# Patient Record
Sex: Female | Born: 1940
Health system: Southern US, Community
[De-identification: ages and names within clinical notes are randomized; demographics above are authoritative.]

## PROBLEM LIST (undated history)

## (undated) DIAGNOSIS — Z923 Personal history of irradiation: Secondary | ICD-10-CM

## (undated) DIAGNOSIS — C541 Malignant neoplasm of endometrium: Secondary | ICD-10-CM

## (undated) DIAGNOSIS — C50512 Malignant neoplasm of lower-outer quadrant of left female breast: Principal | ICD-10-CM

## (undated) DIAGNOSIS — Z9221 Personal history of antineoplastic chemotherapy: Secondary | ICD-10-CM

## (undated) DIAGNOSIS — C50919 Malignant neoplasm of unspecified site of unspecified female breast: Secondary | ICD-10-CM

## (undated) HISTORY — PX: BREAST LUMPECTOMY: SHX2

## (undated) HISTORY — PX: TONSILLECTOMY: SUR1361

## (undated) HISTORY — DX: Malignant neoplasm of lower-outer quadrant of left female breast: C50.512

## (undated) HISTORY — PX: OTHER SURGICAL HISTORY: SHX169

## (undated) HISTORY — DX: Malignant neoplasm of unspecified site of unspecified female breast: C50.919

---

## 1998-12-25 ENCOUNTER — Emergency Department (HOSPITAL_COMMUNITY): Admission: EM | Admit: 1998-12-25 | Discharge: 1998-12-25 | Payer: Self-pay | Admitting: Emergency Medicine

## 1999-01-02 ENCOUNTER — Emergency Department (HOSPITAL_COMMUNITY): Admission: EM | Admit: 1999-01-02 | Discharge: 1999-01-02 | Payer: Self-pay | Admitting: Emergency Medicine

## 1999-12-05 ENCOUNTER — Encounter: Admission: RE | Admit: 1999-12-05 | Discharge: 1999-12-05 | Payer: Self-pay | Admitting: Internal Medicine

## 1999-12-05 ENCOUNTER — Encounter: Payer: Self-pay | Admitting: Internal Medicine

## 2000-12-05 ENCOUNTER — Encounter: Admission: RE | Admit: 2000-12-05 | Discharge: 2000-12-05 | Payer: Self-pay | Admitting: Internal Medicine

## 2000-12-05 ENCOUNTER — Encounter: Payer: Self-pay | Admitting: Internal Medicine

## 2001-12-07 ENCOUNTER — Encounter: Payer: Self-pay | Admitting: Internal Medicine

## 2001-12-07 ENCOUNTER — Encounter: Admission: RE | Admit: 2001-12-07 | Discharge: 2001-12-07 | Payer: Self-pay | Admitting: Internal Medicine

## 2002-12-17 ENCOUNTER — Encounter: Payer: Self-pay | Admitting: Internal Medicine

## 2002-12-17 ENCOUNTER — Encounter: Admission: RE | Admit: 2002-12-17 | Discharge: 2002-12-17 | Payer: Self-pay | Admitting: Internal Medicine

## 2003-12-23 ENCOUNTER — Ambulatory Visit (HOSPITAL_COMMUNITY): Admission: RE | Admit: 2003-12-23 | Discharge: 2003-12-23 | Payer: Self-pay | Admitting: Family Medicine

## 2005-01-02 ENCOUNTER — Encounter: Admission: RE | Admit: 2005-01-02 | Discharge: 2005-01-02 | Payer: Self-pay | Admitting: Internal Medicine

## 2005-01-03 ENCOUNTER — Ambulatory Visit (HOSPITAL_COMMUNITY): Admission: RE | Admit: 2005-01-03 | Discharge: 2005-01-03 | Payer: Self-pay | Admitting: Internal Medicine

## 2006-01-10 ENCOUNTER — Ambulatory Visit (HOSPITAL_COMMUNITY): Admission: RE | Admit: 2006-01-10 | Discharge: 2006-01-10 | Payer: Self-pay | Admitting: Internal Medicine

## 2006-02-15 ENCOUNTER — Emergency Department (HOSPITAL_COMMUNITY): Admission: EM | Admit: 2006-02-15 | Discharge: 2006-02-15 | Payer: Self-pay | Admitting: Family Medicine

## 2006-03-03 ENCOUNTER — Emergency Department (HOSPITAL_COMMUNITY): Admission: EM | Admit: 2006-03-03 | Discharge: 2006-03-03 | Payer: Self-pay | Admitting: Family Medicine

## 2006-06-27 ENCOUNTER — Other Ambulatory Visit: Admission: RE | Admit: 2006-06-27 | Discharge: 2006-06-27 | Payer: Self-pay | Admitting: Internal Medicine

## 2007-01-28 ENCOUNTER — Ambulatory Visit (HOSPITAL_COMMUNITY): Admission: RE | Admit: 2007-01-28 | Discharge: 2007-01-28 | Payer: Self-pay | Admitting: Internal Medicine

## 2007-01-30 ENCOUNTER — Encounter: Admission: RE | Admit: 2007-01-30 | Discharge: 2007-01-30 | Payer: Self-pay | Admitting: Internal Medicine

## 2007-01-30 ENCOUNTER — Encounter (INDEPENDENT_AMBULATORY_CARE_PROVIDER_SITE_OTHER): Payer: Self-pay | Admitting: Specialist

## 2007-07-25 ENCOUNTER — Emergency Department (HOSPITAL_COMMUNITY): Admission: EM | Admit: 2007-07-25 | Discharge: 2007-07-25 | Payer: Self-pay | Admitting: Emergency Medicine

## 2007-08-10 ENCOUNTER — Other Ambulatory Visit: Admission: RE | Admit: 2007-08-10 | Discharge: 2007-08-10 | Payer: Self-pay | Admitting: Internal Medicine

## 2007-09-10 ENCOUNTER — Encounter: Admission: RE | Admit: 2007-09-10 | Discharge: 2007-09-10 | Payer: Self-pay | Admitting: Internal Medicine

## 2008-03-03 ENCOUNTER — Encounter: Admission: RE | Admit: 2008-03-03 | Discharge: 2008-03-03 | Payer: Self-pay | Admitting: Internal Medicine

## 2008-04-26 ENCOUNTER — Emergency Department (HOSPITAL_COMMUNITY): Admission: EM | Admit: 2008-04-26 | Discharge: 2008-04-26 | Payer: Self-pay | Admitting: Emergency Medicine

## 2009-01-25 ENCOUNTER — Other Ambulatory Visit: Admission: RE | Admit: 2009-01-25 | Discharge: 2009-01-25 | Payer: Self-pay | Admitting: Internal Medicine

## 2009-03-28 ENCOUNTER — Encounter: Admission: RE | Admit: 2009-03-28 | Discharge: 2009-03-28 | Payer: Self-pay | Admitting: Internal Medicine

## 2010-04-18 ENCOUNTER — Encounter: Admission: RE | Admit: 2010-04-18 | Discharge: 2010-04-18 | Payer: Self-pay | Admitting: Internal Medicine

## 2010-07-10 ENCOUNTER — Other Ambulatory Visit: Admission: RE | Admit: 2010-07-10 | Discharge: 2010-07-10 | Payer: Self-pay | Admitting: Internal Medicine

## 2010-08-09 ENCOUNTER — Encounter (INDEPENDENT_AMBULATORY_CARE_PROVIDER_SITE_OTHER): Payer: Self-pay | Admitting: *Deleted

## 2010-10-08 ENCOUNTER — Other Ambulatory Visit: Payer: Self-pay | Admitting: Gastroenterology

## 2010-10-14 ENCOUNTER — Encounter: Payer: Self-pay | Admitting: Internal Medicine

## 2010-10-25 NOTE — Letter (Signed)
Summary: New Patient letter  [facility]  [address] [place], [place] [address]   Phone: [phone]  Fax: [phone]       08/09/2010 MRN: [id number]  Autumn Frazier [address] [place], [place]  [address]  Dear Ms. Pandit,  Welcome to the Gastroenterology Division at [facility].    You are scheduled to see Dr.  Russella Dar on 10-01-10 at 3:00p.m. on the 3rd floor at [facility], [address]  We ask that you try to arrive at our office 15 minutes prior to your appointment time to allow for check-in.  We would like you to complete the enclosed self-administered evaluation form prior to your visit and bring it with you on the day of your appointment.  We will review it with you.  Also, please bring a complete list of all your medications or, if you prefer, bring the medication bottles and we will list them.  Please bring your insurance card so that we may make a copy of it.  If your insurance requires a referral to see a specialist, please bring your referral form from your primary care physician.  Co-payments are due at the time of your visit and may be paid by cash, check or credit card.     Your office visit will consist of a consult with your physician (includes a physical exam), any laboratory testing he/she may order, scheduling of any necessary diagnostic testing (e.g. x-ray, ultrasound, CT-scan), and scheduling of a procedure (e.g. Endoscopy, Colonoscopy) if required.  Please allow enough time on your schedule to allow for any/all of these possibilities.    If you cannot keep your appointment, please call [phone] to cancel or reschedule prior to your appointment date.  This allows [place] the opportunity to schedule an appointment for another patient in need of care.  If you do not cancel or reschedule by 5 p.m. the business day prior to your appointment date, you will be charged a $50.00 late cancellation/no-show fee.    Thank you for choosing [facility] for your medical needs.  We appreciate the opportunity to care for you.  Please visit [place] at our website  to learn more about our practice.                     Sincerely,                                                             The Gastroenterology Division

## 2011-03-13 ENCOUNTER — Other Ambulatory Visit: Payer: Self-pay | Admitting: Internal Medicine

## 2011-03-13 DIAGNOSIS — Z1231 Encounter for screening mammogram for malignant neoplasm of breast: Secondary | ICD-10-CM

## 2011-04-22 ENCOUNTER — Ambulatory Visit
Admission: RE | Admit: 2011-04-22 | Discharge: 2011-04-22 | Disposition: A | Discharge status: Home / Self Care | Payer: Medicare Other | Source: Ambulatory Visit | Attending: Internal Medicine | Admitting: Internal Medicine

## 2011-04-22 DIAGNOSIS — Z1231 Encounter for screening mammogram for malignant neoplasm of breast: Secondary | ICD-10-CM

## 2011-06-21 LAB — DIFFERENTIAL
Basophils Absolute: 0.1
Basophils Relative: 2 — ABNORMAL HIGH
Eosinophils Relative: 2
Lymphocytes Relative: 38
Monocytes Absolute: 0.4
Neutro Abs: 3.8

## 2011-06-21 LAB — HEPATIC FUNCTION PANEL
ALT: 16
AST: 28
Albumin: 3.7
Total Protein: 6.2

## 2011-06-21 LAB — POCT I-STAT, CHEM 8
Chloride: 103
Creatinine, Ser: 1.1
Glucose, Bld: 123 — ABNORMAL HIGH
Hemoglobin: 15
Sodium: 138

## 2011-06-21 LAB — URINALYSIS, ROUTINE W REFLEX MICROSCOPIC
Glucose, UA: NEGATIVE
Nitrite: NEGATIVE
Urobilinogen, UA: 1
pH: 7

## 2011-06-21 LAB — CBC
MCV: 88.5
RDW: 14.1

## 2011-06-21 LAB — URINE CULTURE

## 2011-06-21 LAB — URINE MICROSCOPIC-ADD ON

## 2011-06-21 LAB — LIPASE, BLOOD: Lipase: 16

## 2011-07-02 LAB — POCT I-STAT CREATININE: Operator id: 247071

## 2011-07-02 LAB — I-STAT 8, (EC8 V) (CONVERTED LAB)
Glucose, Bld: 89
HCT: 46
Hemoglobin: 15.6 — ABNORMAL HIGH
pH, Ven: 7.312 — ABNORMAL HIGH

## 2012-02-23 ENCOUNTER — Encounter (HOSPITAL_COMMUNITY): Payer: Self-pay

## 2012-02-23 ENCOUNTER — Emergency Department (HOSPITAL_COMMUNITY)
Admission: EM | Admit: 2012-02-23 | Discharge: 2012-02-23 | Disposition: A | Discharge status: Home / Self Care | Payer: Medicare Other | Attending: Emergency Medicine | Admitting: Emergency Medicine

## 2012-02-23 DIAGNOSIS — R55 Syncope and collapse: Secondary | ICD-10-CM

## 2012-02-23 DIAGNOSIS — R42 Dizziness and giddiness: Secondary | ICD-10-CM | POA: Insufficient documentation

## 2012-02-23 DIAGNOSIS — R5381 Other malaise: Secondary | ICD-10-CM | POA: Insufficient documentation

## 2012-02-23 LAB — CBC
Platelets: 213 10*3/uL (ref 150–400)
RBC: 5.03 MIL/uL (ref 3.87–5.11)
RDW: 13.5 % (ref 11.5–15.5)
WBC: 6.6 10*3/uL (ref 4.0–10.5)

## 2012-02-23 LAB — DIFFERENTIAL
Basophils Absolute: 0 10*3/uL (ref 0.0–0.1)
Lymphocytes Relative: 44 % (ref 12–46)
Lymphs Abs: 2.9 10*3/uL (ref 0.7–4.0)
Neutro Abs: 3 10*3/uL (ref 1.7–7.7)

## 2012-02-23 LAB — GLUCOSE, CAPILLARY: Glucose-Capillary: 99 mg/dL (ref 70–99)

## 2012-02-23 LAB — BASIC METABOLIC PANEL
CO2: 20 mEq/L (ref 19–32)
Chloride: 106 mEq/L (ref 96–112)
Glucose, Bld: 101 mg/dL — ABNORMAL HIGH (ref 70–99)
Potassium: 4.3 mEq/L (ref 3.5–5.1)
Sodium: 140 mEq/L (ref 135–145)

## 2012-02-23 MED ORDER — SODIUM CHLORIDE 0.9 % IV BOLUS (SEPSIS)
1000.0000 mL | Freq: Once | INTRAVENOUS | Status: AC
  Administered 2012-02-23: 1000 mL via INTRAVENOUS

## 2012-02-23 NOTE — ED Provider Notes (Signed)
Patient reports she became lightheaded and had near syncopal event. Patient had been up all might with her sick daughter and had not eaten. She denies any chest pain or shortness of breath symptoms lasted possibly 5 minutes and resolved spontaneously, without treatment. No other associated symptoms On exam alert no distress lungs clear auscultation heart regular rate and rhythm abdomen soft nontender Suspect vasovagal event  Doug Sou, MD 02/23/12 [phone]

## 2012-02-23 NOTE — ED Notes (Signed)
Pt. D/C home with family present. Denies pain. Denies SOB. Denies weakness. NAD

## 2012-02-23 NOTE — ED Notes (Signed)
Pt. Is a visitor of someone being seen here in the ED and had a near syncopal episode while sitting in a chair.

## 2012-02-23 NOTE — ED Provider Notes (Signed)
Medical screening examination/treatment/procedure(s) were conducted as a shared visit with non-physician practitioner(s) and myself.  I personally evaluated the patient during the encounter  Doug Sou, MD 02/23/12 [phone]

## 2012-02-23 NOTE — ED Notes (Signed)
Pt. Had a near syncapol episode while visiting another a pt.  Pt. Reports not having anything to eat since yesterday. Pt. Has no hxt of syncope. Pt. Denies any change in medication. Denies any recent falls, Denies pain. NAD.

## 2012-02-23 NOTE — ED Provider Notes (Signed)
History     CSN: [id number]  Arrival date & time 02/23/12  0913   First MD Initiated Contact with Patient 02/23/12 0912      9:33 AM HPI Patient was visiting her daughter in the emergency department when she tried to get up from her chair and became extremely nauseous and dizzy. That was witnessed by ED staff as well as myself. Patient had a brief episode of syncope that lasted approximately 5-10 seconds. Patient denies any pain, headache, abdominal pain, numbness, tingling patient reports a history of hypothyroidism. Denies history of MI, CVA, CAD, arrhythmias.   Patient is a 71 y.o. female presenting with syncope. The history is provided by the patient.  Loss of Consciousness This is a new problem. The current episode started today (9:00 AM). The problem occurs constantly. The problem has been resolved. Associated symptoms include weakness. Pertinent negatives include no abdominal pain, chills, congestion, coughing, fever, headaches, nausea, neck pain, numbness, sore throat or vomiting.   Past Medical History Hypothyroid  History reviewed. No pertinent past surgical history.  History reviewed. No pertinent family history.  Social History Smoker: no Drugs: no Alcohol: no  OB History    Grav Para Term Preterm Abortions TAB SAB Ect Mult Living                  Review of Systems  Constitutional: Negative for fever and chills.  HENT: Negative for congestion, sore throat, rhinorrhea, trouble swallowing, neck pain, neck stiffness, postnasal drip and sinus pressure.   Respiratory: Negative for cough and shortness of breath.   Cardiovascular: Positive for syncope. Negative for palpitations.  Gastrointestinal: Negative for nausea, vomiting and abdominal pain.  Musculoskeletal: Negative for back pain.  Neurological: Positive for dizziness, syncope and weakness. Negative for seizures, speech difficulty, light-headedness, numbness and headaches.  All other systems reviewed and are  negative.    Allergies  Aspirin  Home Medications  No current outpatient prescriptions on file.  BP 113/69  Pulse 61  Resp 16  SpO2 92%  Physical Exam  Vitals reviewed. Constitutional: She is oriented to person, place, and time. Vital signs are normal. She appears well-developed and well-nourished.  HENT:  Head: Normocephalic and atraumatic.  Mouth/Throat: Oropharynx is clear and moist. No oropharyngeal exudate.  Eyes: Conjunctivae and EOM are normal. Pupils are equal, round, and reactive to light. Right eye exhibits no discharge. Left eye exhibits no discharge.  Neck: Normal range of motion. Neck supple.  Cardiovascular: Normal rate, regular rhythm and normal heart sounds.  Exam reveals no friction rub.   No murmur heard. Pulmonary/Chest: Effort normal and breath sounds normal. She has no wheezes. She has no rhonchi. She has no rales. She exhibits no tenderness.  Musculoskeletal: Normal range of motion.  Neurological: She is alert and oriented to person, place, and time. No cranial nerve deficit (Tested CN III-XII). She exhibits normal muscle tone (no strength with hand grip). Coordination (no nystagmus, NML finger to nose. no pronator drift or facial droop) normal.  Skin: Skin is warm and dry. No rash noted. No erythema. No pallor.  Psychiatric: She has a normal mood and affect. Her behavior is normal.    ED Course  Procedures    Date: 02/23/2012  Rate: 61  Rhythm: normal sinus rhythm  QRS Axis: normal  Intervals: normal  ST/T Wave abnormalities: normal  Conduction Disutrbances: none  Narrative Interpretation: no old    Results for orders placed during the hospital encounter of 02/23/12  CBC  Component Value Range   WBC 6.6  4.0 - 10.5 (K/uL)   RBC 5.03  3.87 - 5.11 (MIL/uL)   Hemoglobin 14.8  12.0 - 15.0 (g/dL)   HCT 78.4  69.6 - 29.5 (%)   MCV 90.5  78.0 - 100.0 (fL)   MCH 29.4  26.0 - 34.0 (pg)   MCHC 32.5  30.0 - 36.0 (g/dL)   RDW 28.4  13.2 - 44.0 (%)    Platelets 213  150 - 400 (K/uL)  DIFFERENTIAL      Component Value Range   Neutrophils Relative 45  43 - 77 (%)   Neutro Abs 3.0  1.7 - 7.7 (K/uL)   Lymphocytes Relative 44  12 - 46 (%)   Lymphs Abs 2.9  0.7 - 4.0 (K/uL)   Monocytes Relative 9  3 - 12 (%)   Monocytes Absolute 0.6  0.1 - 1.0 (K/uL)   Eosinophils Relative 2  0 - 5 (%)   Eosinophils Absolute 0.2  0.0 - 0.7 (K/uL)   Basophils Relative 0  0 - 1 (%)   Basophils Absolute 0.0  0.0 - 0.1 (K/uL)  BASIC METABOLIC PANEL      Component Value Range   Sodium 140  135 - 145 (mEq/L)   Potassium 4.3  3.5 - 5.1 (mEq/L)   Chloride 106  96 - 112 (mEq/L)   CO2 20  19 - 32 (mEq/L)   Glucose, Bld 101 (*) 70 - 99 (mg/dL)   BUN 17  6 - 23 (mg/dL)   Creatinine, Ser 1.02  0.50 - 1.10 (mg/dL)   Calcium 9.5  8.4 - 72.5 (mg/dL)   GFR calc non Af Amer 83 (*) >90 (mL/min)   GFR calc Af Amer >90  >90 (mL/min)  GLUCOSE, CAPILLARY      Component Value Range   Glucose-Capillary 99  70 - 99 (mg/dL)     MDM   Pt: ambulated without difficulty. Labs and EKG within normal limits. Patient has no significant medical history to indicate CVA or MI. Likely experienced a vasovagal episode. Advised follow-up with Her PCP Dr Robley Fries nest week. Patient and family voice understanding and are ready for d/c. Discussed plan with Dr. Waynard Edwards, PA-C 02/23/12 1110

## 2012-02-23 NOTE — Discharge Instructions (Signed)
Syncope You have had a fainting (syncopal) spell. A fainting episode is a sudden, short-lived loss of consciousness. It results in complete recovery. It occurs because there has been a temporary shortage of oxygen and/or sugar (glucose) to the brain. CAUSES   Blood pressure pills and other medications that may lower blood pressure below normal. Sudden changes in posture (sudden standing).   Over-medication. Take your medications as directed.   Standing too long. This can cause blood to pool in the legs.   Seizure disorders.   Low blood sugar (hypoglycemia) of diabetes. This more commonly causes coma.   Bearing down to go to the bathroom. This can cause your blood pressure to rise suddenly. Your body compensates by making the blood pressure too low when you stop bearing down.   Hardening of the arteries where the brain temporarily does not receive enough blood.   Irregular heart beat and circulatory problems.   Fear, emotional distress, injury, sight of blood, or illness.  Your caregiver will send you home if the syncope was from non-worrisome causes (benign). Depending on your age and health, you may stay to be monitored and observed. If you return home, have someone stay with you if your caregiver feels that is desirable. It is very important to keep all follow-up referrals and appointments in order to properly manage this condition. This is a serious problem which can lead to serious illness and death if not carefully managed.  WARNING: Do not drive or operate machinery until your caregiver feels that it is safe for you to do so. SEEK IMMEDIATE MEDICAL CARE IF:   You have another fainting episode or faint while lying or sitting down. DO NOT DRIVE YOURSELF. Call 911 if no other help is available.   You have chest pain, are feeling sick to your stomach (nausea), vomiting or abdominal pain.   You have an irregular heartbeat or one that is very fast (pulse over 120 beats per minute).    You have a loss of feeling in some part of your body or lose movement in your arms or legs.   You have difficulty with speech, confusion, severe weakness, or visual problems.   You become sweaty and/or feel light headed.  Make sure you are rechecked as instructed. Document Released: 09/09/2005 Document Revised: 08/29/2011 Document Reviewed: 04/30/2007 Exit[employer] Patient Information 2012 [employer]. 

## 2012-04-09 ENCOUNTER — Other Ambulatory Visit: Payer: Self-pay | Admitting: *Deleted

## 2012-04-09 NOTE — Telephone Encounter (Signed)
reqeust refill on rx proair inhaler. Faxed back to [facility] patient overdue for OV with Dr.NOrins

## 2012-05-13 ENCOUNTER — Other Ambulatory Visit: Payer: Self-pay | Admitting: Internal Medicine

## 2012-05-13 DIAGNOSIS — Z1231 Encounter for screening mammogram for malignant neoplasm of breast: Secondary | ICD-10-CM

## 2012-06-03 ENCOUNTER — Ambulatory Visit: Payer: Medicare Other

## 2012-06-04 ENCOUNTER — Ambulatory Visit: Payer: Medicare Other

## 2012-06-16 ENCOUNTER — Ambulatory Visit
Admission: RE | Admit: 2012-06-16 | Discharge: 2012-06-16 | Disposition: A | Discharge status: Home / Self Care | Payer: Medicare Other | Source: Ambulatory Visit | Attending: Internal Medicine | Admitting: Internal Medicine

## 2012-06-16 DIAGNOSIS — Z1231 Encounter for screening mammogram for malignant neoplasm of breast: Secondary | ICD-10-CM

## 2012-07-03 ENCOUNTER — Other Ambulatory Visit: Payer: Self-pay | Admitting: Internal Medicine

## 2012-07-03 ENCOUNTER — Other Ambulatory Visit (HOSPITAL_COMMUNITY)
Admission: RE | Admit: 2012-07-03 | Discharge: 2012-07-03 | Disposition: A | Discharge status: Home / Self Care | Payer: Medicare Other | Source: Ambulatory Visit | Attending: Internal Medicine | Admitting: Internal Medicine

## 2012-07-03 DIAGNOSIS — Z124 Encounter for screening for malignant neoplasm of cervix: Secondary | ICD-10-CM | POA: Insufficient documentation

## 2012-07-03 DIAGNOSIS — Z1151 Encounter for screening for human papillomavirus (HPV): Secondary | ICD-10-CM | POA: Insufficient documentation

## 2012-08-17 ENCOUNTER — Other Ambulatory Visit: Payer: Self-pay | Admitting: *Deleted

## 2012-08-17 MED ORDER — FLUTICASONE-SALMETEROL 100-50 MCG/DOSE IN AEPB: 1.0000 | INHALATION_SPRAY | Freq: Two times a day (BID) | RESPIRATORY_TRACT | Status: DC

## 2013-05-26 ENCOUNTER — Other Ambulatory Visit: Payer: Self-pay

## 2013-05-26 DIAGNOSIS — Z1231 Encounter for screening mammogram for malignant neoplasm of breast: Secondary | ICD-10-CM

## 2013-06-18 ENCOUNTER — Ambulatory Visit
Admission: RE | Admit: 2013-06-18 | Discharge: 2013-06-18 | Disposition: A | Discharge status: Home / Self Care | Payer: Medicare Other | Source: Ambulatory Visit

## 2013-06-18 DIAGNOSIS — Z1231 Encounter for screening mammogram for malignant neoplasm of breast: Secondary | ICD-10-CM

## 2013-06-22 ENCOUNTER — Other Ambulatory Visit: Payer: Self-pay | Admitting: Internal Medicine

## 2013-06-22 DIAGNOSIS — R928 Other abnormal and inconclusive findings on diagnostic imaging of breast: Secondary | ICD-10-CM

## 2013-06-24 ENCOUNTER — Ambulatory Visit
Admission: RE | Admit: 2013-06-24 | Discharge: 2013-06-24 | Disposition: A | Discharge status: Home / Self Care | Payer: Medicare Other | Source: Ambulatory Visit | Attending: Internal Medicine | Admitting: Internal Medicine

## 2013-06-24 ENCOUNTER — Other Ambulatory Visit: Payer: Self-pay | Admitting: Internal Medicine

## 2013-06-24 DIAGNOSIS — R928 Other abnormal and inconclusive findings on diagnostic imaging of breast: Secondary | ICD-10-CM

## 2013-06-24 DIAGNOSIS — R921 Mammographic calcification found on diagnostic imaging of breast: Secondary | ICD-10-CM

## 2013-06-25 ENCOUNTER — Ambulatory Visit
Admission: RE | Admit: 2013-06-25 | Discharge: 2013-06-25 | Disposition: A | Discharge status: Home / Self Care | Payer: Medicare Other | Source: Ambulatory Visit | Attending: Internal Medicine | Admitting: Internal Medicine

## 2013-06-25 DIAGNOSIS — R921 Mammographic calcification found on diagnostic imaging of breast: Secondary | ICD-10-CM

## 2013-10-15 HISTORY — PX: BREAST BIOPSY: SHX20

## 2014-04-25 ENCOUNTER — Other Ambulatory Visit: Payer: Self-pay

## 2014-04-25 DIAGNOSIS — Z1231 Encounter for screening mammogram for malignant neoplasm of breast: Secondary | ICD-10-CM

## 2014-06-20 ENCOUNTER — Ambulatory Visit: Payer: Medicare Other

## 2014-06-23 ENCOUNTER — Ambulatory Visit: Payer: Medicare Other

## 2014-08-03 ENCOUNTER — Ambulatory Visit
Admission: RE | Admit: 2014-08-03 | Discharge: 2014-08-03 | Disposition: A | Discharge status: Home / Self Care | Payer: Medicare Other | Source: Ambulatory Visit

## 2014-08-03 DIAGNOSIS — Z1231 Encounter for screening mammogram for malignant neoplasm of breast: Secondary | ICD-10-CM

## 2015-06-26 ENCOUNTER — Other Ambulatory Visit: Payer: Self-pay

## 2015-06-26 DIAGNOSIS — Z1231 Encounter for screening mammogram for malignant neoplasm of breast: Secondary | ICD-10-CM

## 2015-08-07 DIAGNOSIS — H04123 Dry eye syndrome of bilateral lacrimal glands: Secondary | ICD-10-CM | POA: Diagnosis not present

## 2015-08-07 DIAGNOSIS — H47323 Drusen of optic disc, bilateral: Secondary | ICD-10-CM | POA: Diagnosis not present

## 2015-08-07 DIAGNOSIS — H26491 Other secondary cataract, right eye: Secondary | ICD-10-CM | POA: Diagnosis not present

## 2015-08-07 DIAGNOSIS — H25812 Combined forms of age-related cataract, left eye: Secondary | ICD-10-CM | POA: Diagnosis not present

## 2015-08-08 ENCOUNTER — Ambulatory Visit: Payer: Self-pay

## 2015-08-23 ENCOUNTER — Ambulatory Visit: Payer: Self-pay

## 2015-09-19 DIAGNOSIS — J00 Acute nasopharyngitis [common cold]: Secondary | ICD-10-CM | POA: Diagnosis not present

## 2015-09-24 DIAGNOSIS — Z923 Personal history of irradiation: Secondary | ICD-10-CM

## 2015-09-24 HISTORY — DX: Personal history of irradiation: Z92.3

## 2015-10-02 ENCOUNTER — Ambulatory Visit: Payer: Self-pay

## 2015-10-04 DIAGNOSIS — E559 Vitamin D deficiency, unspecified: Secondary | ICD-10-CM | POA: Diagnosis not present

## 2015-10-04 DIAGNOSIS — Z79899 Other long term (current) drug therapy: Secondary | ICD-10-CM | POA: Diagnosis not present

## 2015-10-04 DIAGNOSIS — I10 Essential (primary) hypertension: Secondary | ICD-10-CM | POA: Diagnosis not present

## 2015-10-11 DIAGNOSIS — I10 Essential (primary) hypertension: Secondary | ICD-10-CM | POA: Diagnosis not present

## 2015-10-11 DIAGNOSIS — E559 Vitamin D deficiency, unspecified: Secondary | ICD-10-CM | POA: Diagnosis not present

## 2015-10-11 DIAGNOSIS — Z1389 Encounter for screening for other disorder: Secondary | ICD-10-CM | POA: Diagnosis not present

## 2015-10-11 DIAGNOSIS — J45901 Unspecified asthma with (acute) exacerbation: Secondary | ICD-10-CM | POA: Diagnosis not present

## 2015-10-11 DIAGNOSIS — Z0001 Encounter for general adult medical examination with abnormal findings: Secondary | ICD-10-CM | POA: Diagnosis not present

## 2015-10-11 DIAGNOSIS — E039 Hypothyroidism, unspecified: Secondary | ICD-10-CM | POA: Diagnosis not present

## 2015-10-11 DIAGNOSIS — G4700 Insomnia, unspecified: Secondary | ICD-10-CM | POA: Diagnosis not present

## 2015-10-11 DIAGNOSIS — J309 Allergic rhinitis, unspecified: Secondary | ICD-10-CM | POA: Diagnosis not present

## 2015-10-11 DIAGNOSIS — K219 Gastro-esophageal reflux disease without esophagitis: Secondary | ICD-10-CM | POA: Diagnosis not present

## 2015-10-20 ENCOUNTER — Ambulatory Visit: Payer: Self-pay

## 2015-10-24 DIAGNOSIS — H25812 Combined forms of age-related cataract, left eye: Secondary | ICD-10-CM | POA: Diagnosis not present

## 2015-10-24 DIAGNOSIS — H524 Presbyopia: Secondary | ICD-10-CM | POA: Diagnosis not present

## 2015-10-24 DIAGNOSIS — H47323 Drusen of optic disc, bilateral: Secondary | ICD-10-CM | POA: Diagnosis not present

## 2015-10-31 ENCOUNTER — Ambulatory Visit
Admission: RE | Admit: 2015-10-31 | Discharge: 2015-10-31 | Disposition: A | Discharge status: Home / Self Care | Payer: Commercial Managed Care - HMO | Source: Ambulatory Visit

## 2015-10-31 ENCOUNTER — Other Ambulatory Visit: Payer: Self-pay

## 2015-10-31 DIAGNOSIS — D0512 Intraductal carcinoma in situ of left breast: Secondary | ICD-10-CM | POA: Diagnosis not present

## 2015-10-31 DIAGNOSIS — N6320 Unspecified lump in the left breast, unspecified quadrant: Secondary | ICD-10-CM

## 2015-10-31 DIAGNOSIS — R921 Mammographic calcification found on diagnostic imaging of breast: Secondary | ICD-10-CM

## 2015-10-31 DIAGNOSIS — N63 Unspecified lump in breast: Secondary | ICD-10-CM | POA: Diagnosis not present

## 2015-10-31 DIAGNOSIS — N601 Diffuse cystic mastopathy: Secondary | ICD-10-CM | POA: Diagnosis not present

## 2015-10-31 DIAGNOSIS — N6012 Diffuse cystic mastopathy of left breast: Secondary | ICD-10-CM | POA: Diagnosis not present

## 2015-10-31 DIAGNOSIS — Z1231 Encounter for screening mammogram for malignant neoplasm of breast: Secondary | ICD-10-CM

## 2015-10-31 DIAGNOSIS — C50512 Malignant neoplasm of lower-outer quadrant of left female breast: Secondary | ICD-10-CM | POA: Diagnosis not present

## 2015-10-31 HISTORY — PX: BREAST BIOPSY: SHX20

## 2015-11-01 ENCOUNTER — Ambulatory Visit
Admission: RE | Admit: 2015-11-01 | Discharge: 2015-11-01 | Disposition: A | Discharge status: Home / Self Care | Payer: Commercial Managed Care - HMO | Source: Ambulatory Visit

## 2015-11-01 ENCOUNTER — Other Ambulatory Visit: Payer: Self-pay

## 2015-11-01 ENCOUNTER — Other Ambulatory Visit: Payer: Self-pay | Admitting: Internal Medicine

## 2015-11-01 DIAGNOSIS — N6320 Unspecified lump in the left breast, unspecified quadrant: Secondary | ICD-10-CM

## 2015-11-01 DIAGNOSIS — C50912 Malignant neoplasm of unspecified site of left female breast: Secondary | ICD-10-CM

## 2015-11-02 ENCOUNTER — Telehealth: Payer: Self-pay | Admitting: *Deleted

## 2015-11-02 ENCOUNTER — Encounter: Payer: Self-pay | Admitting: *Deleted

## 2015-11-02 DIAGNOSIS — C50512 Malignant neoplasm of lower-outer quadrant of left female breast: Secondary | ICD-10-CM

## 2015-11-02 DIAGNOSIS — Z170 Estrogen receptor positive status [ER+]: Secondary | ICD-10-CM | POA: Insufficient documentation

## 2015-11-02 HISTORY — DX: Malignant neoplasm of lower-outer quadrant of left female breast: C50.512

## 2015-11-02 NOTE — Telephone Encounter (Signed)
Left vm for pt to return call concerning Autumn Frazier for 11/08/15. Contact information provided.

## 2015-11-02 NOTE — Telephone Encounter (Signed)
Confirmed [facility] for 11/08/15 at 1215 .  Instructions and contact information given.

## 2015-11-06 ENCOUNTER — Ambulatory Visit
Admission: RE | Admit: 2015-11-06 | Discharge: 2015-11-06 | Disposition: A | Discharge status: Home / Self Care | Payer: Commercial Managed Care - HMO | Source: Ambulatory Visit | Attending: Internal Medicine | Admitting: Internal Medicine

## 2015-11-06 DIAGNOSIS — R921 Mammographic calcification found on diagnostic imaging of breast: Secondary | ICD-10-CM | POA: Diagnosis not present

## 2015-11-06 DIAGNOSIS — C50912 Malignant neoplasm of unspecified site of left female breast: Secondary | ICD-10-CM

## 2015-11-06 DIAGNOSIS — N6092 Unspecified benign mammary dysplasia of left breast: Secondary | ICD-10-CM | POA: Diagnosis not present

## 2015-11-06 HISTORY — PX: BREAST BIOPSY: SHX20

## 2015-11-08 ENCOUNTER — Encounter: Payer: Self-pay | Admitting: Physical Therapy

## 2015-11-08 ENCOUNTER — Ambulatory Visit: Payer: Commercial Managed Care - HMO | Attending: General Surgery | Admitting: Physical Therapy

## 2015-11-08 ENCOUNTER — Encounter: Payer: Self-pay | Admitting: Oncology

## 2015-11-08 ENCOUNTER — Ambulatory Visit (HOSPITAL_BASED_OUTPATIENT_CLINIC_OR_DEPARTMENT_OTHER): Payer: Commercial Managed Care - HMO | Admitting: Oncology

## 2015-11-08 ENCOUNTER — Other Ambulatory Visit: Payer: Self-pay | Admitting: General Surgery

## 2015-11-08 ENCOUNTER — Encounter: Payer: Self-pay | Admitting: Nurse Practitioner

## 2015-11-08 ENCOUNTER — Ambulatory Visit
Admission: RE | Admit: 2015-11-08 | Discharge: 2015-11-08 | Disposition: A | Discharge status: Home / Self Care | Payer: Commercial Managed Care - HMO | Source: Ambulatory Visit | Attending: Radiation Oncology | Admitting: Radiation Oncology

## 2015-11-08 ENCOUNTER — Other Ambulatory Visit (HOSPITAL_BASED_OUTPATIENT_CLINIC_OR_DEPARTMENT_OTHER): Payer: Commercial Managed Care - HMO

## 2015-11-08 VITALS — BP 154/74 | HR 77 | Temp 97.8°F | Resp 18 | Ht 66.5 in | Wt 174.6 lb

## 2015-11-08 DIAGNOSIS — C50512 Malignant neoplasm of lower-outer quadrant of left female breast: Secondary | ICD-10-CM

## 2015-11-08 DIAGNOSIS — Z170 Estrogen receptor positive status [ER+]: Secondary | ICD-10-CM | POA: Diagnosis not present

## 2015-11-08 DIAGNOSIS — Z0000 Encounter for general adult medical examination without abnormal findings: Secondary | ICD-10-CM | POA: Diagnosis not present

## 2015-11-08 DIAGNOSIS — R293 Abnormal posture: Secondary | ICD-10-CM | POA: Diagnosis not present

## 2015-11-08 DIAGNOSIS — E039 Hypothyroidism, unspecified: Secondary | ICD-10-CM | POA: Diagnosis not present

## 2015-11-08 DIAGNOSIS — M8580 Other specified disorders of bone density and structure, unspecified site: Secondary | ICD-10-CM | POA: Diagnosis not present

## 2015-11-08 LAB — COMPREHENSIVE METABOLIC PANEL
ALBUMIN: 3.5 g/dL (ref 3.5–5.0)
ALK PHOS: 96 U/L (ref 40–150)
ALT: 17 U/L (ref 0–55)
AST: 14 U/L (ref 5–34)
Anion Gap: 7 mEq/L (ref 3–11)
BUN: 16.8 mg/dL (ref 7.0–26.0)
CO2: 27 mEq/L (ref 22–29)
Calcium: 9.3 mg/dL (ref 8.4–10.4)
Chloride: 108 mEq/L (ref 98–109)
Creatinine: 0.8 mg/dL (ref 0.6–1.1)
EGFR: 76 mL/min/{1.73_m2} — AB (ref >90)
Glucose: 83 mg/dl (ref 70–140)
POTASSIUM: 4 meq/L (ref 3.5–5.1)
Sodium: 142 mEq/L (ref 136–145)
Total Bilirubin: 0.33 mg/dL (ref 0.20–1.20)
Total Protein: 6.5 g/dL (ref 6.4–8.3)

## 2015-11-08 LAB — CBC WITH DIFFERENTIAL/PLATELET
BASO%: 0.3 % (ref 0.0–2.0)
BASOS ABS: 0 10*3/uL (ref 0.0–0.1)
EOS ABS: 0.2 10*3/uL (ref 0.0–0.5)
EOS%: 2.1 % (ref 0.0–7.0)
HEMATOCRIT: 41.6 % (ref 34.8–46.6)
HEMOGLOBIN: 13.2 g/dL (ref 11.6–15.9)
LYMPH#: 2.2 10*3/uL (ref 0.9–3.3)
LYMPH%: 29.8 % (ref 14.0–49.7)
MCH: 28.6 pg (ref 25.1–34.0)
MCHC: 31.7 g/dL (ref 31.5–36.0)
MCV: 90.2 fL (ref 79.5–101.0)
MONO#: 0.5 10*3/uL (ref 0.1–0.9)
MONO%: 7 % (ref 0.0–14.0)
NEUT#: 4.6 10*3/uL (ref 1.5–6.5)
NEUT%: 60.8 % (ref 38.4–76.8)
PLATELETS: 232 10*3/uL (ref 145–400)
RBC: 4.61 10*6/uL (ref 3.70–5.45)
RDW: 13.7 % (ref 11.2–14.5)
WBC: 7.5 10*3/uL (ref 3.9–10.3)

## 2015-11-08 NOTE — Therapy (Signed)
[facility] [address] [place], [place], [address] Phone: [phone]   Fax:  [phone]  Physical Therapy Evaluation  Patient Details  Name: Autumn Frazier MRN: [id number] Date of Birth: [birth year] Referring Provider: Dr. Emelia Loron  Encounter Date: 11/08/2015      PT End of Session - 11/08/15 1657    Visit Number 1   Number of Visits 1   PT Start Time 1445   PT Stop Time 1512   PT Time Calculation (min) 27 min   Activity Tolerance Patient tolerated treatment well   Behavior During Therapy [facility] for tasks assessed/performed      Past Medical History  Diagnosis Date  . Breast cancer of lower-outer quadrant of left female breast (HCC) 11/02/2015  . Breast cancer [facility])     Past Surgical History  Procedure Laterality Date  . Tonsillectomy    . Herniated disc      There were no vitals filed for this visit.  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  It measures 6 mm located in the lower outer quadrant, is ER/PR positive and HER2 negative.  It is grade I with a Ki67 of 10%.  Her case was discussed in the breast multi-disciplinary conference and a recommended treatment plan determined.   Patient Stated Goals Reduce lymphedema risk and learn post op shoulder ROM HEP   Currently in Pain? No/denies            [facility] PT Assessment - 11/08/15 0001    Assessment   Medical Diagnosis Left breast cancer   Referring Provider Dr. Emelia Loron   Onset Date/Surgical Date 10/31/15   Hand  Dominance Right   Prior Therapy none   Precautions   Precautions Other (comment)   Precaution Comments active breast cancer   Restrictions   Weight Bearing Restrictions No   Balance Screen   Has the patient fallen in the past 6 months No   Has the patient had a decrease in activity level because of a fear of falling?  No   Is the patient reluctant to leave their home because of a fear of falling?  No   Home [employer]ist information centre manager residence   Living Arrangements Spouse/significant other   Available Help at Discharge Family   Prior Function   Level of Independence Independent   Vocation Retired   Leisure She does not exercise but is very active in the community   Cognition   Overall Cognitive Status Within Functional Limits for tasks assessed   Posture/Postural Control   Posture/Postural Control Postural limitations   Postural Limitations Forward head;Rounded Shoulders   ROM / Strength   AROM / PROM / Strength AROM;Strength   AROM   AROM Assessment Site Shoulder   Right/Left Shoulder Right;Left   Right Shoulder Extension 50 Degrees   Right Shoulder Flexion 154 Degrees   Right Shoulder ABduction 155 Degrees   Right Shoulder Internal Rotation 70 Degrees   Right Shoulder External Rotation 63 Degrees   Left Shoulder Extension 67  Degrees   Left Shoulder Flexion 148 Degrees   Left Shoulder ABduction 161 Degrees   Left Shoulder Internal Rotation 82 Degrees   Left Shoulder External Rotation 80 Degrees   Strength   Overall Strength Within functional limits for tasks performed           LYMPHEDEMA/ONCOLOGY QUESTIONNAIRE - 11/08/15 1656    Type   Cancer Type Left breast cancer   Lymphedema Assessments   Lymphedema Assessments Upper extremities   Right Upper Extremity Lymphedema   10 cm Proximal to Olecranon Process 24.6 cm   Olecranon Process 24.1 cm   10 cm Proximal to Ulnar Styloid Process 21.4 cm   Just Proximal to Ulnar Styloid Process 16.6 cm    Across Hand at [employer] 19.8 cm   At [place] of 2nd Digit 6.6 cm   Left Upper Extremity Lymphedema   10 cm Proximal to Olecranon Process 25 cm   Olecranon Process 24.1 cm   10 cm Proximal to Ulnar Styloid Process 21.1 cm   Just Proximal to Ulnar Styloid Process 16.5 cm   Across Hand at [employer] 19.5 cm   At [place] of 2nd Digit 6.5 cm      Patient was instructed today in a home exercise program today for post op shoulder range of motion. These included active assist shoulder flexion in sitting, scapular retraction, wall walking with shoulder abduction, and hands behind head external rotation.  She was encouraged to do these twice a day, holding 3 seconds and repeating 5 times when permitted by her physician.         PT Education - 11/08/15 1657    Education provided Yes   Education Details Lymphedema risk reduction and post op shoulder ROM HEP   Person(s) Educated Patient;Spouse   Methods Explanation;Demonstration;Handout   Comprehension Returned demonstration;Verbalized understanding              Breast Clinic Goals - 11/08/15 1700    Patient will be able to verbalize understanding of pertinent lymphedema risk reduction practices relevant to her diagnosis specifically related to skin care.   Time 1   Period Days   Status Achieved   Patient will be able to return demonstrate and/or verbalize understanding of the post-op home exercise program related to regaining shoulder range of motion.   Time 1   Period Days   Status Achieved   Patient will be able to verbalize understanding of the importance of attending the postoperative After Breast Cancer Class for further lymphedema risk reduction education and therapeutic exercise.   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  It measures 6 mm located in the  lower outer quadrant, is ER/PR positive and HER2 negative.  It is grade I with a Ki67 of 10%.  Her case was discussed in the breast multi-disciplinary conference and a recommended treatment plan determined.  She is planning to have a left lumpectomy and sentinel node biopsy followed by possible radiation and possible anti-estrogen therapy.  She may benefit from post op PT to regain shoulder ROM and reduce lymphedema risk.   Pt will benefit from skilled therapeutic intervention in order to improve on the following deficits Decreased strength;Pain;Decreased knowledge of precautions;Impaired UE functional use;Decreased range of motion   Rehab  Potential Excellent   Clinical Impairments Affecting Rehab Potential none   PT Frequency One time visit   PT Treatment/Interventions Therapeutic exercise;Patient/family education   PT Next Visit Plan Will f/u after surgery to determine needs   Consulted and Agree with Plan of Care Patient;Family member/caregiver   Family Member Consulted husband     Patient will follow up at outpatient cancer rehab if needed following surgery.  If the patient requires physical therapy at that time, a specific plan will be dictated and sent to the referring physician for approval. The patient was educated today on appropriate basic range of motion exercises to begin post operatively and the importance of attending the After Breast Cancer class following surgery.  Patient was educated today on lymphedema risk reduction practices as it pertains to recommendations that will benefit the patient immediately following surgery.  She verbalized good understanding.  No additional physical therapy is indicated at this time.         G-Codes - 21-Nov-2015 1700    Functional Assessment Tool Used Clinical Judgement   Functional Limitation Other PT primary   Other PT Primary Current Status (S2583) At least 1 percent but less than 20 percent impaired, limited or restricted   Other PT Primary Goal  Status (M6219) At least 1 percent but less than 20 percent impaired, limited or restricted   Other PT Primary Discharge Status (I7125) At least 1 percent but less than 20 percent impaired, limited or restricted       Problem List Patient Active Problem List   Diagnosis Date Noted  . Breast cancer of lower-outer quadrant of left female breast ([place]) 11/02/2015    Annia Friendly, PT 2015-11-21 5:02 PM   [place], [place], [address] Phone: [phone]   Fax:  [phone]  Name: Autumn Frazier MRN: [id number] Date of Birth: [birth year]

## 2015-11-08 NOTE — Progress Notes (Signed)
Autumn Frazier  [phone] [phone]     ID: ITATI BROCKSMITH DOB: January 01, 1941  MR#: [id number]  HYQ#:[id number]  Patient Care Team: Josetta Huddle, MD as PCP - General (Internal Medicine) Rolm Bookbinder, MD as Consulting Physician (General Surgery) Chauncey Cruel, MD as Consulting Physician (Oncology) Eppie Gibson, MD as Attending Physician (Radiation Oncology) PCP: Henrine Screws, MD GYN: OTHER MD:  CHIEF COMPLAINT: Estrogen receptor positive breast cancer  CURRENT TREATMENT: Awaiting definitive surgery   BREAST CANCER HISTORY: "Autumn Frazier" had routine screening mammography with tomography of the [facility] 10/31/2015. A possible mass associated with calcifications was noted in the left breast. Accordingly the patient proceeded to digital diagnostic left mammography and ultrasonography the same day. Breast density was category B. In the left breast that was a small ill-defined mass at approximately 4:00 measuring 7 mm mammographically. Also there was a small group of heterogeneous calcifications in a linear configuration in the upper outer quadrant of the left breast, at the 12:30 o'clock position. The calcifications spanned a 0.4 cm. There were approximately 3 cm superior to the mass. There was a second group of heterogeneous calcifications approximately 1 cm posterior and lateral to the mass. These spanned 1.1 cm. The distance between both groups of calcifications (with a mass in between) was 4.5 cm.  I examined her was no palpable abnormality. By ultrasonography there was a small irregular hypoechoic mass at the 3:30 o'clock position of the left breast measuring 0.6 cm maximally. Ultrasound of the axilla was unremarkable.  On the same day the patient underwent biopsy of the left breast mass in question and also the calcifications at the 12:30 o'clock position. The breast mass ([id number]) proved to be an invasive ductal carcinoma, grade 1, estrogen  receptor 100% positive, progesterone receptor 30% positive, both with strong staining intensity, with an MIB-1 of 10%, and no HER-2/neu amplification, the signals ratio being 1.50 and the number Purcell 2.25.  The area of calcifications at 12:30 o'clock showed usual ductal hyperplasia without atypia.  On 11/06/2015 Autumn Frazier underwent biopsy of a second area of calcifications less than 2 cm away from the primary mass and this showed (S [id number]) atypical ductal hyperplasia.  Subsequent history is as detailed below.  INTERVAL HISTORY: Autumn Frazier was evaluated in the multidisciplinary breast cancer clinic 11/08/2015 accompanied by her husband [place]. Her case was also presented in the multidisciplinary breast cancer conference that same morning. At that time a preliminary plan was proposed: Breast conserving surgery with sentinel lymph node sampling and genetics referral. It was felt if the patient had clear margins and took antiestrogen for 5 years she might be able to avoid radiation.  REVIEW OF SYSTEMS: There were no specific symptoms leading to the original mammogram, which was routinely scheduled. The patient denies unusual headaches, visual changes, nausea, vomiting, stiff neck, dizziness, or gait imbalance. There has been no cough, phlegm production, or pleurisy, no chest pain or pressure, and no change in bowel or bladder habits. The patient denies fever, rash, bleeding, unexplained fatigue or unexplained weight loss. She admits to mild stress urinary incontinence. A detailed review of systems was otherwise entirely negative.  PAST MEDICAL HISTORY: Past Medical History  Diagnosis Date  . Breast cancer of lower-outer quadrant of left female breast ([place]) 11/02/2015  . Breast cancer ([place])     PAST SURGICAL HISTORY: Past Surgical History  Procedure Laterality Date  . Tonsillectomy    . Herniated disc      FAMILY HISTORY Family History  Problem Relation Age of Onset  . Brain cancer Sister   .  Ovarian cancer Maternal Aunt    the patient's father died from heart disease at the age of 73. The patient's mother died from heart failure at the age of 30. The patient had one brother who was shot in an accident at the age of 8. She has 1 sister. On the mother's side and aunt at age 74 was diagnosed with ovarian cancer. The patient's own sister was diagnosed with a brain tumor at the age of 6 and died 76 years later.  GYNECOLOGIC HISTORY:  No LMP recorded. Menarche age 64, first live birth age 57, the patient is [place] P3. She stopped having periods in 1997. She never used hormone replacement. She never used oral contraceptives.  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 Her husband Autumn Frazier worked in Char[employer] fundraiser for many years and then started the [employer] here in town. He is now retired. Daughter Autumn Frazier is a homemaker in [place]. Son ELLIANNA Frazier the third lives in [place] were he works in [place] for [employer]. Daughter Autumn Frazier lives in [place] where she works as an Immunologist. The patient has 10 grandchildren (7 blood and 3 step). She attends [employer]   HEALTH MAINTENANCE: Social History  Substance Use Topics  . Smoking status: Former Research scientist (life sciences)  . Smokeless tobacco: None  . Alcohol Use: 1.8 oz/week    3 Glasses of wine per week     Colonoscopy: 2011?  PAP:  Bone density: remote  Lipid panel:  No Active Allergies  Current Outpatient Prescriptions  Medication Sig Dispense Refill  . aspirin 81 MG tablet Take 81 mg by mouth daily.    Marland Kitchen b complex vitamins tablet Take 1 tablet by mouth daily.    . Biotin 5000 MCG TABS Take by mouth.    . Fluticasone-Salmeterol (ADVAIR) 100-50 MCG/DOSE AEPB Inhale 1 puff into the lungs every 12 (twelve) hours. 60 each 1  . levothyroxine (SYNTHROID, LEVOTHROID) 150 MCG tablet 150 mcg.  3  . Vitamin D, Ergocalciferol, (DRISDOL) 50000  units CAPS capsule Take 50,000 Units by mouth every 7 (seven) days.     No current facility-administered medications for this visit.    OBJECTIVE: Middle-aged white woman who appears stated age 75 Vitals:   11/08/15 1252  BP: 154/74  Pulse: 77  Temp: 97.8 F (36.6 C)  Resp: 18     Body mass index is 27.76 kg/(m^2).    ECOG FS:0 - Asymptomatic  Ocular: Sclerae unicteric, pupils equal, round and reactive to light Ear-nose-throat: Oropharynx clear and moist Lymphatic: No cervical or supraclavicular adenopathy Lungs no rales or rhonchi, good excursion bilaterally Heart regular rate and rhythm, no murmur appreciated Abd soft, nontender, positive bowel sounds MSK no focal spinal tenderness, no joint edema Neuro: non-focal, well-oriented, appropriate affect Breasts: The right breast is unremarkable. The left breast is status post recent biopsy. There is a minimal ecchymosis. There is no palpable mass. There are no skin or nipple changes of concern. The left axilla is benign.  LAB RESULTS:  CMP     Component Value Date/Time   NA 142 11/08/2015 1224   NA 140 02/23/2012 0930   K 4.0 11/08/2015 1224   K 4.3 02/23/2012 0930   CL 106 02/23/2012 0930   CO2 27 11/08/2015 1224   CO2 20 02/23/2012 0930  GLUCOSE 83 11/08/2015 1224   GLUCOSE 101* 02/23/2012 0930   BUN 16.8 11/08/2015 1224   BUN 17 02/23/2012 0930   CREATININE 0.8 11/08/2015 1224   CREATININE 0.73 02/23/2012 0930   CALCIUM 9.3 11/08/2015 1224   CALCIUM 9.5 02/23/2012 0930   PROT 6.5 11/08/2015 1224   PROT 6.2 04/26/2008 0220   ALBUMIN 3.5 11/08/2015 1224   ALBUMIN 3.7 04/26/2008 0220   AST 14 11/08/2015 1224   AST 28 04/26/2008 0220   ALT 17 11/08/2015 1224   ALT 16 04/26/2008 0220   ALKPHOS 96 11/08/2015 1224   ALKPHOS 73 04/26/2008 0220   BILITOT 0.33 11/08/2015 1224   BILITOT 1.0 04/26/2008 0220   GFRNONAA 83* 02/23/2012 0930   GFRAA >90 02/23/2012 0930    INo results found for: SPEP, UPEP  Lab  Results  Component Value Date   WBC 7.5 11/08/2015   NEUTROABS 4.6 11/08/2015   HGB 13.2 11/08/2015   HCT 41.6 11/08/2015   MCV 90.2 11/08/2015   PLT 232 11/08/2015      Chemistry      Component Value Date/Time   NA 142 11/08/2015 1224   NA 140 02/23/2012 0930   K 4.0 11/08/2015 1224   K 4.3 02/23/2012 0930   CL 106 02/23/2012 0930   CO2 27 11/08/2015 1224   CO2 20 02/23/2012 0930   BUN 16.8 11/08/2015 1224   BUN 17 02/23/2012 0930   CREATININE 0.8 11/08/2015 1224   CREATININE 0.73 02/23/2012 0930      Component Value Date/Time   CALCIUM 9.3 11/08/2015 1224   CALCIUM 9.5 02/23/2012 0930   ALKPHOS 96 11/08/2015 1224   ALKPHOS 73 04/26/2008 0220   AST 14 11/08/2015 1224   AST 28 04/26/2008 0220   ALT 17 11/08/2015 1224   ALT 16 04/26/2008 0220   BILITOT 0.33 11/08/2015 1224   BILITOT 1.0 04/26/2008 0220       No results found for: LABCA2  No components found for: [id number]  No results for input(s): INR in the last 168 hours.  Urinalysis    Component Value Date/Time   COLORURINE RED BIOCHEMICALS MAY BE AFFECTED BY COLOR* 04/26/2008 0455   APPEARANCEUR CLOUDY* 04/26/2008 0455   LABSPEC 1.021 04/26/2008 0455   PHURINE 7.0 04/26/2008 0455   GLUCOSEU NEGATIVE 04/26/2008 0455   HGBUR LARGE* 04/26/2008 0455   BILIRUBINUR NEGATIVE 04/26/2008 0455   KETONESUR 15* 04/26/2008 0455   PROTEINUR 30* 04/26/2008 0455   UROBILINOGEN 1.0 04/26/2008 0455   NITRITE NEGATIVE 04/26/2008 0455   LEUKOCYTESUR MODERATE* 04/26/2008 0455      ELIGIBLE FOR AVAILABLE RESEARCH PROTOCOL: no  STUDIES: Mm Digital Diagnostic Unilat L  11/06/2015  CLINICAL DATA:  Stereotactic biopsy was performed of an 11 mm group of calcifications posterior, superior, and lateral to the recently biopsied left breast mass (with associated ribbon shaped biopsy clip) at 3:30 position. The mass biopsied on 10/31/2015 was diagnosed as invasive ductal carcinoma with ductal carcinoma in situ. EXAM: DIAGNOSTIC  LEFT MAMMOGRAM POST STEREOTACTIC BIOPSY COMPARISON:  Previous exam(s). FINDINGS: Mammographic images were obtained following stereotactic guided biopsy of an 11 mm group of calcifications in the slightly outer and slightly upper left breast. A coil shaped biopsy clip was placed today and is satisfactorily positioned at the biopsy site. No definite residual calcifications are seen in this region on the today's post biopsy images. The coil shaped biopsy clip placed today is approximately 1.8 cm posterior, superior, and lateral to the ribbon shaped biopsy clip at  the site of the recently diagnosed cancer. IMPRESSION: Satisfactory position of coil shaped biopsy clip. Biopsy clip placed today is 1.8 cm from the ribbon shaped biopsy clip associated with the recently biopsied cancer at 3:30 position. Final Assessment: Post Procedure Mammograms for Marker Placement Electronically Signed   By: Curlene Dolphin M.D.   On: 11/06/2015 11:21   Mm Digital Diagnostic Unilat L  10/31/2015  CLINICAL DATA:  Recall from screening mammography. EXAM: DIGITAL DIAGNOSTIC LEFT MAMMOGRAM ULTRASOUND LEFT BREAST COMPARISON:  Previous exam(s). ACR Breast Density Category b: There are scattered areas of fibroglandular density. FINDINGS: Additional views of the left breast demonstrate a small ill-defined mass to be present located within the left breast at approximately the 3:30- 4 o'clock position with internal heterogeneous calcifications. By mammography this measures 7 mm in size. In addition, there is a small group of heterogeneous calcifications in a linear configuration located within the upper outer quadrant of the left breast at approximately the 12:30 o'clock position. These span 4 mm and are located approximately 3 cm superior to the mass. Also, a faint group of heterogeneous calcifications are also present located approximately 1 cm posterior and lateral to the mass. These calcifications span 11 mm. The distance from the posterior  aspect of these calcifications to the anterior superior aspect of the smaller group of superiorly located calcifications measures 4.5 cm. On physical exam, there is no discrete palpable abnormality in the area of mammographic concern. Targeted ultrasound is performed, showing a small, irregular, hypoechoic mass located within the left breast at the 3:30 o'clock position 2 cm from the nipple. This is located within the posterior 1/3 of the breast and does contain internal calcifications. This measures 6 x 5 x 6 mm in size and likely corresponds to the irregular mass noted on mammography. Tissue sampling via ultrasound-guided core biopsy is recommended. This will be performed and reported separately. Ultrasound of the left axilla demonstrates normal axillary contents and no evidence for adenopathy. IMPRESSION: 1. 6 mm irregularly marginated mass located within the posterior 1/3 of the left breast at the 3:30 o'clock position 2 cm from the nipple. Tissue sampling via ultrasound-guided core biopsy is recommended and will be performed and reported separately. 2. 2 groups of suspicious calcifications located within the left breast. One group is located 3 cm superior to the irregular mass and spans 4 mm. This second group is located 1 cm posterior and lateral to the mass and spans 11 mm. Each of these are worrisome for possible DCIS. Tissue sampling of the most distant smaller (4 mm) group is recommended at this time. This will be performed and reported separately. Depending on the results of these biopsies, tissue sampling of the more posterior group of calcifications may be indicated. RECOMMENDATION: Left breast ultrasound-guided core biopsy of the mass located at 3:30 o'clock position 2 cm from the nipple and left breast stereotactic biopsy of the small faint group of suspicious calcifications located within the left breast 3 cm superior to the mass (left upper outer quadrant). I have discussed the findings and  recommendations with the patient. Results were also provided in writing at the conclusion of the visit. If applicable, a reminder letter will be sent to the patient regarding the next appointment. BI-RADS CATEGORY  4: Suspicious. Electronically Signed   By: Altamese Cabal M.D.   On: 10/31/2015 14:29   US Breast Ltd Uni Left Inc Axilla  10/31/2015  CLINICAL DATA:  Recall from screening mammography. EXAM: DIGITAL DIAGNOSTIC LEFT MAMMOGRAM ULTRASOUND  LEFT BREAST COMPARISON:  Previous exam(s). ACR Breast Density Category b: There are scattered areas of fibroglandular density. FINDINGS: Additional views of the left breast demonstrate a small ill-defined mass to be present located within the left breast at approximately the 3:30- 4 o'clock position with internal heterogeneous calcifications. By mammography this measures 7 mm in size. In addition, there is a small group of heterogeneous calcifications in a linear configuration located within the upper outer quadrant of the left breast at approximately the 12:30 o'clock position. These span 4 mm and are located approximately 3 cm superior to the mass. Also, a faint group of heterogeneous calcifications are also present located approximately 1 cm posterior and lateral to the mass. These calcifications span 11 mm. The distance from the posterior aspect of these calcifications to the anterior superior aspect of the smaller group of superiorly located calcifications measures 4.5 cm. On physical exam, there is no discrete palpable abnormality in the area of mammographic concern. Targeted ultrasound is performed, showing a small, irregular, hypoechoic mass located within the left breast at the 3:30 o'clock position 2 cm from the nipple. This is located within the posterior 1/3 of the breast and does contain internal calcifications. This measures 6 x 5 x 6 mm in size and likely corresponds to the irregular mass noted on mammography. Tissue sampling via ultrasound-guided core  biopsy is recommended. This will be performed and reported separately. Ultrasound of the left axilla demonstrates normal axillary contents and no evidence for adenopathy. IMPRESSION: 1. 6 mm irregularly marginated mass located within the posterior 1/3 of the left breast at the 3:30 o'clock position 2 cm from the nipple. Tissue sampling via ultrasound-guided core biopsy is recommended and will be performed and reported separately. 2. 2 groups of suspicious calcifications located within the left breast. One group is located 3 cm superior to the irregular mass and spans 4 mm. This second group is located 1 cm posterior and lateral to the mass and spans 11 mm. Each of these are worrisome for possible DCIS. Tissue sampling of the most distant smaller (4 mm) group is recommended at this time. This will be performed and reported separately. Depending on the results of these biopsies, tissue sampling of the more posterior group of calcifications may be indicated. RECOMMENDATION: Left breast ultrasound-guided core biopsy of the mass located at 3:30 o'clock position 2 cm from the nipple and left breast stereotactic biopsy of the small faint group of suspicious calcifications located within the left breast 3 cm superior to the mass (left upper outer quadrant). I have discussed the findings and recommendations with the patient. Results were also provided in writing at the conclusion of the visit. If applicable, a reminder letter will be sent to the patient regarding the next appointment. BI-RADS CATEGORY  4: Suspicious. Electronically Signed   By: Rolla Plate M.D.   On: 10/31/2015 14:29   Mm Diag Breast Tomo Uni Left  10/31/2015  CLINICAL DATA:  Post left breast ultrasound-guided core biopsy and left breast stereotactic core biopsy. EXAM: 3D DIAGNOSTIC LEFT MAMMOGRAM POST ULTRASOUND AND STEREOTACTIC CORE BIOPSY COMPARISON:  Previous exam(s). FINDINGS: 3D Mammographic images were obtained following ultrasound guided  biopsy of the small posteriorly located mass located within the left breast at the 3:30 o'clock position 2 cm from the nipple and stereotactic guided biopsy of the small group of calcifications located within the left breast at 12:30 o'clock position. The ribbon shaped clip associated with the left breast ultrasound-guided core biopsy is in appropriate position  and is adjacent to residual calcifications associated with the mass in this region. The X shaped clip is located approximately 1 cm medial to the expected location of the calcifications. This small group of calcifications has been removed. IMPRESSION: Appropriate positioning of the ribbon shaped clip associated with the ultrasound-guided core biopsy of the left breast mass located at 3:30 o'clock position. The X shaped clip is located approximately 1 cm medial to the expected location of the small group of biopsied calcifications. Final Assessment: Post Procedure Mammograms for Marker Placement Electronically Signed   By: Rolla Plate M.D.   On: 10/31/2015 16:04   Mm Screening Breast Tomo Bilateral  10/31/2015  CLINICAL DATA:  Screening. EXAM: DIGITAL SCREENING BILATERAL MAMMOGRAM WITH 3D TOMO WITH CAD COMPARISON:  Previous exam(s). ACR Breast Density Category b: There are scattered areas of fibroglandular density. FINDINGS: In the left breast, a possible mass and calcifications warrant further evaluation. In the right breast, no findings suspicious for malignancy. Images were processed with CAD. IMPRESSION: Further evaluation is suggested for possible mass and calcifications in the left breast. RECOMMENDATION: Diagnostic mammogram and possibly ultrasound of the left breast. (Code:[id number]) The patient will be contacted regarding the findings, and additional imaging will be scheduled. BI-RADS CATEGORY  0: Incomplete. Need additional imaging evaluation and/or prior mammograms for comparison. Electronically Signed   By: Rolla Plate M.D.   On:  10/31/2015 09:38   Mm Lt Breast Bx W Loc Dev 1st Lesion Image Bx Spec Stereo Guide  11/06/2015  CLINICAL DATA:  Recent diagnosis of invasive ductal carcinoma with DCIS in the left breast following ultrasound-guided biopsy of a small mass at 3:30 position. Stereotactic biopsy of a 11 mm group of calcifications slightly posterior, lateral, and superior to the biopsied mass was recommended. EXAM: LEFT BREAST STEREOTACTIC CORE NEEDLE BIOPSY COMPARISON:  Previous exams. FINDINGS: The patient and I discussed the procedure of stereotactic-guided biopsy including benefits and alternatives. We discussed the high likelihood of a successful procedure. We discussed the risks of the procedure including infection, bleeding, tissue injury, clip migration, and inadequate sampling. Informed written consent was given. The usual time out protocol was performed immediately prior to the procedure. Using sterile technique and 1% Lidocaine with and without epinephrine as local anesthetic, under stereotactic guidance, a 9 gauge vacuum assisted device was used to perform core needle biopsy of calcifications in the slightly upper outer posterior left third breast using a lateral to medial approach. Specimen radiograph was performed showing no definite calcifications. Therefore, the calcifications were re- targeted, and more tissue samples were taken. Specimen radiograph was then performed showing multiple calcifications within a few of the cores. Specimens with calcifications are identified for pathology. At the conclusion of the procedure, a coil shaped tissue marker clip was deployed into the biopsy cavity. Follow-up 2-view mammogram was performed and dictated separately. IMPRESSION: Stereotactic-guided biopsy of left breast. No apparent complications. Electronically Signed   By: Britta Mccreedy M.D.   On: 11/06/2015 12:04   Mm Lt Breast Bx W Loc Dev 1st Lesion Image Bx Spec Stereo Guide  11/01/2015  ADDENDUM REPORT: 11/01/2015 14:26  ADDENDUM: The pathology associated with the left breast ultrasound-guided core biopsy of the small mass located at the 3:30 o'clock position demonstrated invasive ductal carcinoma with DCIS felt to be grade 1. This is concordant. The pathology associated with the small group of calcifications located within the left breast at the 12:30 o'clock position (anterior and superior to the mass) demonstrated fibrocystic changes with calcifications and adenosis  but no malignancy or atypia. This is also concordant. I have discussed the findings with patient and her husband and answered their questions. The patient's biopsy site is clean and dry without hematoma formation or signs of infection. She is mild to moderately tender. The patient is currently scheduled on Monday 11/06/2015 for an additional stereotactic biopsy of the 11 mm group of microcalcifications located posterior and slightly lateral to the mass. The patient is also scheduled for the breast cancer multi disciplinary clinic on 11/08/2015. Post biopsy wound care instructions were reviewed with patient. Also, Norris Cross, nurse navigator reviewed educational materials with the patient. The patient was encouraged to call the breast center for additional questions or concerns. Electronically Signed   By: Altamese Cabal M.D.   On: 11/01/2015 14:26  11/01/2015  CLINICAL DATA:  Left breast mass with suspicious left breast calcifications. EXAM: LEFT BREAST STEREOTACTIC CORE NEEDLE BIOPSY COMPARISON:  Previous exams. FINDINGS: The patient and I discussed the procedure of stereotactic-guided biopsy including benefits and alternatives. We discussed the high likelihood of a successful procedure. We discussed the risks of the procedure including infection, bleeding, tissue injury, clip migration, and inadequate sampling. Informed written consent was given. The usual time out protocol was performed immediately prior to the procedure. Using sterile technique and 1%  lidocaine with epinephrine as local anesthetic, under stereotactic guidance, a 9 gauge vacuum assisted device was used to perform core needle biopsy of the small (4 mm in diameter) group of calcifications located within the upper outer quadrant of the left breast at the 12:30 o'clock position using a lateral approach. Specimen radiograph was performed showing representative calcifications within the specimen. Specimens with calcifications are identified for pathology. At the conclusion of the procedure, an X shaped tissue marker clip was deployed into the biopsy cavity. Follow-up 2-view mammogram was performed and dictated separately. IMPRESSION: Stereotactic-guided biopsy of the 4 mm group of left breast calcifications located at the 12:30 o'clock position as discussed above. No apparent complications. Electronically Signed: By: Altamese Cabal M.D. On: 10/31/2015 16:00   [place] Lt Breast Bx W Loc Dev 1st Lesion Img Bx Spec US Guide  11/01/2015  ADDENDUM REPORT: 11/01/2015 14:26 ADDENDUM: The pathology associated with the left breast ultrasound-guided core biopsy of the small mass located at the 3:30 o'clock position demonstrated invasive ductal carcinoma with DCIS felt to be grade 1. This is concordant. The pathology associated with the small group of calcifications located within the left breast at the 12:30 o'clock position (anterior and superior to the mass) demonstrated fibrocystic changes with calcifications and adenosis but no malignancy or atypia. This is also concordant. I have discussed the findings with patient and her husband and answered their questions. The patient's biopsy site is clean and dry without hematoma formation or signs of infection. She is mild to moderately tender. The patient is currently scheduled on Monday 11/06/2015 for an additional stereotactic biopsy of the 11 mm group of microcalcifications located posterior and slightly lateral to the mass. The patient is also scheduled for the  breast cancer multi disciplinary clinic on 11/08/2015. Post biopsy wound care instructions were reviewed with patient. Also, Norris Cross, nurse navigator reviewed educational materials with the patient. The patient was encouraged to call the breast center for additional questions or concerns. Electronically Signed   By: Altamese Cabal M.D.   On: 11/01/2015 14:26  11/01/2015  CLINICAL DATA:  Left breast mass. EXAM: ULTRASOUND GUIDED LEFT BREAST CORE NEEDLE BIOPSY COMPARISON:  Previous exam(s). FINDINGS: I met  with the patient and we discussed the procedure of ultrasound-guided biopsy, including benefits and alternatives. We discussed the high likelihood of a successful procedure. We discussed the risks of the procedure, including infection, bleeding, tissue injury, clip migration, and inadequate sampling. Informed written consent was given. The usual time-out protocol was performed immediately prior to the procedure. Using sterile technique and 1% Lidocaine as local anesthetic, under direct ultrasound visualization, a 14 gauge spring-loaded device was used to perform biopsy of the small irregular mass located within the left breast at the 3:30 o'clock position 2 cm from the nipple using a lateral approach. At the conclusion of the procedure a ribbon shaped tissue marker clip was deployed into the biopsy cavity. Follow up 2 view mammogram was performed and dictated separately. IMPRESSION: Ultrasound guided biopsy of the left breast mass located at the 3:30 o'clock position 2 cm from the nipple as discussed above. No apparent complications. Electronically Signed: By: Altamese Cabal M.D. On: 10/31/2015 14:36    ASSESSMENT: 75 y.o. [place] woman status post left breast biopsy 10/31/2015 for a clinical T1b N0, stage IA  invasive ductal carcinoma, grade 1, estrogen and progesterone receptor positive, HER-2 nonamplified, with an MIB-1 of 10%  (a) biopsy of an area of calcifications 10/31/2015 showed usual  ductal hyperplasia  (b) biopsy of a second area of calcifications 1.8 cm from the breast mass 11/06/2015 showed atypical ductal hyperplasia  (1) definitive surgery pending  (2) consider adjuvant radiation  (3) adjuvant antiestrogen spelled follow local therapy  (4) genetic testing pending  PLAN: We spent the better part of today's 45 minute appointment discussing the biology of breast cancer in general, and the specifics of the patient's tumor in particular. Autumn Frazier understands the difference between local and systemic therapy for breast cancer and also the options regarding systemic therapy.  Specifically, she will be a good candidate for anti-estrogens, which will cut in half her breast cancer risk (that is the risk of this breast cancer recurring in the breast, the risk of this breast cancer recurring outside of the breast, and her risk of developing another breast cancer in either breast in the future). On the other hand she would not benefit from anti-HER-2 immunotherapy, since her breast cancer is not HER-2 amplified.  Chemotherapy generally is useful in aggressive rapidly growing cancers. It is generally of marginal benefit in slow-growing nonaggressive-looking cancers like hers. In addition national guidelines recommend against chemotherapy in 5 mm or smaller tumors. Her cancer right now is pretty much near that cough. Accordingly at this point chemotherapy is not being considered.  Once she has her definitive surgery we will be able to determine if radiation is advisable. Whenever she completes her local treatment, which will be surgery plus or minus radiation, she will start her anti-estrogens.  Because of the history of ovarian cancer in the family, we are proceeding with genetics testing. However there is no need to delay the surgery until we receive the results of the genetics testing, since even if the patient proves to have a deleterious mutation it is safe for her to keep her breasts.  In that case I'll need to do is intensify surveillance with yearly MRI as well as mammography.  Autumn Frazier has a good understanding of the overall plan. She agrees with it. She knows the goal of treatment in her case is cure. She will call with any problems that may develop before her next visit here.  Chauncey Cruel, MD   11/08/2015 4:01 PM Medical Oncology  and Hematology [facility] [address] [place], [place] [address] Tel. [phone]    Fax. [phone]

## 2015-11-08 NOTE — Patient Instructions (Signed)

## 2015-11-08 NOTE — Progress Notes (Signed)
Autumn Frazier is a very pleasant 75 y.o. female from [place], [place] with newly diagnosed invasive ductal carcinoma with ductal carcinoma of the left breast.  Biopsy results revealed the tumor's prognostic profile is ER positive, PR positive, and HER2/neu negative.  She presents today with her husband to the [facility] ([facility]) for treatment consideration and recommendations from the breast surgeon, radiation oncologist, and medical oncologist.     I briefly met with Autumn Frazier and her husband during her [facility] visit today. We discussed the purpose of the Survivorship Clinic, which will include monitoring for recurrence, coordinating completion of age and gender-appropriate cancer screenings, promotion of overall wellness, as well as managing potential late/long-term side effects of anti-cancer treatments.    The treatment plan for Autumn Frazier will likely include surgery, radiation therapy, and anti-estrogen therapy.  She will meet with the Genetics Counselor due to her family history of breast cancer. As of today, the intent of treatment for Autumn Frazier is cure, therefore she will be eligible for the Survivorship Clinic upon her completion of treatment.  Her survivorship care plan (SCP) document will be drafted and updated throughout the course of her treatment trajectory. She will receive the SCP in an office visit with myself in the Survivorship Clinic once she has completed treatment.   Autumn Frazier was encouraged to ask questions and all questions were answered to her satisfaction.  She was given my business card and encouraged to contact me with any concerns regarding survivorship.  I look forward to participating in her care.   Heather T. Mackey, ANP Survivorship Program [facility] [phone] 

## 2015-11-08 NOTE — Progress Notes (Addendum)
Radiation Oncology         [phone] ________________________________  Initial outpatient Consultation  Name: Autumn Frazier MRN: [id number]  Date: 11/08/2015  DOB: [birth year]  EG:BTDVV,OHYWVP NEVILL, MD  Rolm Bookbinder, MD   REFERRING PHYSICIAN: Rolm Bookbinder, MD  DIAGNOSIS:    ICD-9-CM ICD-10-CM   1. Breast cancer of lower-outer quadrant of left female breast ([place]) 174.5 C50.512    Stage I [id number] Left Breast LOQ Invasive Ductal Carcinoma with DCIS, ER+ / PR+ / Her2negative, Grade 1  HISTORY OF PRESENT ILLNESS::Autumn Frazier is a 75 y.o. female who presented with calcifications on a screening mammogram with a mass in between these suspicious califications.   Mass was 49m on [place]  3 Biopsies were performed in this region:  Left breast UOQ bx showed ADH with calcifications.  Left breast 3:30 region biopsy showed invasive ductal carcinoma with DCIS, grade I,  ER+ / PR+ / Her2negative.  Left breast 12:30 region biopsy showed benign tissue / no atypia.  She is in excellent health.  Otherwise in her USOH. Reports some hearing and vision loss (uses glasses).  Full review of systems otherwise negative.    PAST MEDICAL HISTORY:  has a past medical history of Breast [place] of [place]-outer [place] of [place] and [place]    PAST SURGICAL HISTORY: Past Surgical History  Procedure Laterality Date  . Tonsillectomy    . Herniated disc      FAMILY HISTORY: family history includes Brain cancer in her sister; Ovarian cancer in her maternal aunt.  SOCIAL HISTORY:  reports that she has quit smoking. She does not have any smokeless tobacco history on file. She reports that she drinks about 1.8 oz of alcohol per week. She reports that she does not use illicit drugs.  ALLERGIES: Review of patient's allergies indicates no active allergies.  MEDICATIONS:  Current Outpatient Prescriptions  Medication Sig Dispense Refill  . aspirin 81 MG tablet  Take 81 mg by mouth daily.    .Marland Kitchenb complex vitamins tablet Take 1 tablet by mouth daily.    . Biotin 5000 MCG TABS Take by mouth.    . Fluticasone-Salmeterol (ADVAIR) 100-50 MCG/DOSE AEPB Inhale 1 puff into the lungs every 12 (twelve) hours. 60 each 1  . levothyroxine (SYNTHROID, LEVOTHROID) 150 MCG tablet 150 mcg.  3  . Vitamin D, Ergocalciferol, (DRISDOL) 50000 units CAPS capsule Take 50,000 Units by mouth every 7 (seven) days.     No current facility-administered medications for this encounter.    REVIEW OF SYSTEMS:  Notable for that above.   PHYSICAL EXAM:    Vitals with BMI 11/08/2015  Height 5' 6.5"  Weight 174 lbs 10 oz  BMI 271.0 Systolic 1626 Diastolic 74  Pulse 77  Respirations 18   General: Alert and oriented, in no acute distress HEENT: Head is normocephalic. Extraocular movements are intact. Oropharynx is clear. Neck: Neck is supple, no palpable cervical or supraclavicular lymphadenopathy. Heart: Regular in rate and rhythm with no murmurs, rubs, or gallops. Chest: Clear to auscultation bilaterally, with no rhonchi, wheezes, or rales. Abdomen: Soft, nontender, nondistended, with no rigidity or guarding. Extremities: No cyanosis or edema. Lymphatics: see Neck Exam Skin: No concerning lesions. Musculoskeletal: symmetric strength and muscle tone throughout. Neurologic: Cranial nerves II through XII are grossly intact. No obvious focalities. Speech is fluent. Coordination is intact. Psychiatric: Judgment and insight are intact. Affect is appropriate. Breasts: tenderness at biopsy site of left breast in LOQ -  did not palpate left breast or axilla deeply. No  palpable masses appreciated in the breast  or axilla on the right.    ECOG = 0  0 - Asymptomatic (Fully active, able to carry on all predisease activities without restriction)  1 - Symptomatic but completely ambulatory (Restricted in physically strenuous activity but ambulatory and able to carry out work of a light  or sedentary nature. For example, light housework, office work)  2 - Symptomatic, <50% in bed during the day (Ambulatory and capable of all self care but unable to carry out any work activities. Up and about more than 50% of waking hours)  3 - Symptomatic, >50% in bed, but not bedbound (Capable of only limited self-care, confined to bed or chair 50% or more of waking hours)  4 - Bedbound (Completely disabled. Cannot carry on any self-care. Totally confined to bed or chair)  5 - Death   Eustace Pen MM, Creech RH, Tormey DC, et al. [phone]). "Toxicity and response criteria of the [facility]". [place] Oncol. 5 (6): 649-55   LABORATORY DATA:  Lab Results  Component Value Date   WBC 7.5 11/08/2015   HGB 13.2 11/08/2015   HCT 41.6 11/08/2015   MCV 90.2 11/08/2015   PLT 232 11/08/2015   CMP     Component Value Date/Time   NA 142 11/08/2015 1224   NA 140 02/23/2012 0930   K 4.0 11/08/2015 1224   K 4.3 02/23/2012 0930   CL 106 02/23/2012 0930   CO2 27 11/08/2015 1224   CO2 20 02/23/2012 0930   GLUCOSE 83 11/08/2015 1224   GLUCOSE 101* 02/23/2012 0930   BUN 16.8 11/08/2015 1224   BUN 17 02/23/2012 0930   CREATININE 0.8 11/08/2015 1224   CREATININE 0.73 02/23/2012 0930   CALCIUM 9.3 11/08/2015 1224   CALCIUM 9.5 02/23/2012 0930   PROT 6.5 11/08/2015 1224   PROT 6.2 04/26/2008 0220   ALBUMIN 3.5 11/08/2015 1224   ALBUMIN 3.7 04/26/2008 0220   AST 14 11/08/2015 1224   AST 28 04/26/2008 0220   ALT 17 11/08/2015 1224   ALT 16 04/26/2008 0220   ALKPHOS 96 11/08/2015 1224   ALKPHOS 73 04/26/2008 0220   BILITOT 0.33 11/08/2015 1224   BILITOT 1.0 04/26/2008 0220   GFRNONAA 83* 02/23/2012 0930   GFRAA >90 02/23/2012 0930         RADIOGRAPHY: Mm Digital Diagnostic Unilat L  11/06/2015  CLINICAL DATA:  Stereotactic biopsy was performed of an 11 mm group of calcifications posterior, superior, and lateral to the recently biopsied left breast mass (with  associated ribbon shaped biopsy clip) at 3:30 position. The mass biopsied on 10/31/2015 was diagnosed as invasive ductal carcinoma with ductal carcinoma in situ. EXAM: DIAGNOSTIC LEFT MAMMOGRAM POST STEREOTACTIC BIOPSY COMPARISON:  Previous exam(s). FINDINGS: Mammographic images were obtained following stereotactic guided biopsy of an 11 mm group of calcifications in the slightly outer and slightly upper left breast. A coil shaped biopsy clip was placed today and is satisfactorily positioned at the biopsy site. No definite residual calcifications are seen in this region on the today's post biopsy images. The coil shaped biopsy clip placed today is approximately 1.8 cm posterior, superior, and lateral to the ribbon shaped biopsy clip at the site of the recently diagnosed cancer. IMPRESSION: Satisfactory position of coil shaped biopsy clip. Biopsy clip placed today is 1.8 cm from the ribbon shaped biopsy clip associated with the recently biopsied cancer at 3:30 position. Final  Assessment: Post Procedure Mammograms for Marker Placement Electronically Signed   By: Curlene Dolphin M.D.   On: 11/06/2015 11:21   Mm Digital Diagnostic Unilat L  10/31/2015  CLINICAL DATA:  Recall from screening mammography. EXAM: DIGITAL DIAGNOSTIC LEFT MAMMOGRAM ULTRASOUND LEFT BREAST COMPARISON:  Previous exam(s). ACR Breast Density Category b: There are scattered areas of fibroglandular density. FINDINGS: Additional views of the left breast demonstrate a small ill-defined mass to be present located within the left breast at approximately the 3:30- 4 o'clock position with internal heterogeneous calcifications. By mammography this measures 7 mm in size. In addition, there is a small group of heterogeneous calcifications in a linear configuration located within the upper outer quadrant of the left breast at approximately the 12:30 o'clock position. These span 4 mm and are located approximately 3 cm superior to the mass. Also, a faint group  of heterogeneous calcifications are also present located approximately 1 cm posterior and lateral to the mass. These calcifications span 11 mm. The distance from the posterior aspect of these calcifications to the anterior superior aspect of the smaller group of superiorly located calcifications measures 4.5 cm. On physical exam, there is no discrete palpable abnormality in the area of mammographic concern. Targeted ultrasound is performed, showing a small, irregular, hypoechoic mass located within the left breast at the 3:30 o'clock position 2 cm from the nipple. This is located within the posterior 1/3 of the breast and does contain internal calcifications. This measures 6 x 5 x 6 mm in size and likely corresponds to the irregular mass noted on mammography. Tissue sampling via ultrasound-guided core biopsy is recommended. This will be performed and reported separately. Ultrasound of the left axilla demonstrates normal axillary contents and no evidence for adenopathy. IMPRESSION: 1. 6 mm irregularly marginated mass located within the posterior 1/3 of the left breast at the 3:30 o'clock position 2 cm from the nipple. Tissue sampling via ultrasound-guided core biopsy is recommended and will be performed and reported separately. 2. 2 groups of suspicious calcifications located within the left breast. One group is located 3 cm superior to the irregular mass and spans 4 mm. This second group is located 1 cm posterior and lateral to the mass and spans 11 mm. Each of these are worrisome for possible DCIS. Tissue sampling of the most distant smaller (4 mm) group is recommended at this time. This will be performed and reported separately. Depending on the results of these biopsies, tissue sampling of the more posterior group of calcifications may be indicated. RECOMMENDATION: Left breast ultrasound-guided core biopsy of the mass located at 3:30 o'clock position 2 cm from the nipple and left breast stereotactic biopsy of the  small faint group of suspicious calcifications located within the left breast 3 cm superior to the mass (left upper outer quadrant). I have discussed the findings and recommendations with the patient. Results were also provided in writing at the conclusion of the visit. If applicable, a reminder letter will be sent to the patient regarding the next appointment. BI-RADS CATEGORY  4: Suspicious. Electronically Signed   By: Altamese Cabal M.D.   On: 10/31/2015 14:29   US Breast Ltd Uni Left Inc Axilla  10/31/2015  CLINICAL DATA:  Recall from screening mammography. EXAM: DIGITAL DIAGNOSTIC LEFT MAMMOGRAM ULTRASOUND LEFT BREAST COMPARISON:  Previous exam(s). ACR Breast Density Category b: There are scattered areas of fibroglandular density. FINDINGS: Additional views of the left breast demonstrate a small ill-defined mass to be present located within the left breast  at approximately the 3:30- 4 o'clock position with internal heterogeneous calcifications. By mammography this measures 7 mm in size. In addition, there is a small group of heterogeneous calcifications in a linear configuration located within the upper outer quadrant of the left breast at approximately the 12:30 o'clock position. These span 4 mm and are located approximately 3 cm superior to the mass. Also, a faint group of heterogeneous calcifications are also present located approximately 1 cm posterior and lateral to the mass. These calcifications span 11 mm. The distance from the posterior aspect of these calcifications to the anterior superior aspect of the smaller group of superiorly located calcifications measures 4.5 cm. On physical exam, there is no discrete palpable abnormality in the area of mammographic concern. Targeted ultrasound is performed, showing a small, irregular, hypoechoic mass located within the left breast at the 3:30 o'clock position 2 cm from the nipple. This is located within the posterior 1/3 of the breast and does contain  internal calcifications. This measures 6 x 5 x 6 mm in size and likely corresponds to the irregular mass noted on mammography. Tissue sampling via ultrasound-guided core biopsy is recommended. This will be performed and reported separately. Ultrasound of the left axilla demonstrates normal axillary contents and no evidence for adenopathy. IMPRESSION: 1. 6 mm irregularly marginated mass located within the posterior 1/3 of the left breast at the 3:30 o'clock position 2 cm from the nipple. Tissue sampling via ultrasound-guided core biopsy is recommended and will be performed and reported separately. 2. 2 groups of suspicious calcifications located within the left breast. One group is located 3 cm superior to the irregular mass and spans 4 mm. This second group is located 1 cm posterior and lateral to the mass and spans 11 mm. Each of these are worrisome for possible DCIS. Tissue sampling of the most distant smaller (4 mm) group is recommended at this time. This will be performed and reported separately. Depending on the results of these biopsies, tissue sampling of the more posterior group of calcifications may be indicated. RECOMMENDATION: Left breast ultrasound-guided core biopsy of the mass located at 3:30 o'clock position 2 cm from the nipple and left breast stereotactic biopsy of the small faint group of suspicious calcifications located within the left breast 3 cm superior to the mass (left upper outer quadrant). I have discussed the findings and recommendations with the patient. Results were also provided in writing at the conclusion of the visit. If applicable, a reminder letter will be sent to the patient regarding the next appointment. BI-RADS CATEGORY  4: Suspicious. Electronically Signed   By: Altamese Cabal M.D.   On: 10/31/2015 14:29   Mm Diag Breast Tomo Uni Left  10/31/2015  CLINICAL DATA:  Post left breast ultrasound-guided core biopsy and left breast stereotactic core biopsy. EXAM: 3D DIAGNOSTIC  LEFT MAMMOGRAM POST ULTRASOUND AND STEREOTACTIC CORE BIOPSY COMPARISON:  Previous exam(s). FINDINGS: 3D Mammographic images were obtained following ultrasound guided biopsy of the small posteriorly located mass located within the left breast at the 3:30 o'clock position 2 cm from the nipple and stereotactic guided biopsy of the small group of calcifications located within the left breast at 12:30 o'clock position. The ribbon shaped clip associated with the left breast ultrasound-guided core biopsy is in appropriate position and is adjacent to residual calcifications associated with the mass in this region. The X shaped clip is located approximately 1 cm medial to the expected location of the calcifications. This small group of calcifications has been removed.  IMPRESSION: Appropriate positioning of the ribbon shaped clip associated with the ultrasound-guided core biopsy of the left breast mass located at 3:30 o'clock position. The X shaped clip is located approximately 1 cm medial to the expected location of the small group of biopsied calcifications. Final Assessment: Post Procedure Mammograms for Marker Placement Electronically Signed   By: Altamese Cabal M.D.   On: 10/31/2015 16:04   Mm Screening Breast Tomo Bilateral  10/31/2015  CLINICAL DATA:  Screening. EXAM: DIGITAL SCREENING BILATERAL MAMMOGRAM WITH 3D TOMO WITH CAD COMPARISON:  Previous exam(s). ACR Breast Density Category b: There are scattered areas of fibroglandular density. FINDINGS: In the left breast, a possible mass and calcifications warrant further evaluation. In the right breast, no findings suspicious for malignancy. Images were processed with CAD. IMPRESSION: Further evaluation is suggested for possible mass and calcifications in the left breast. RECOMMENDATION: Diagnostic mammogram and possibly ultrasound of the left breast. (Code:[id number]) The patient will be contacted regarding the findings, and additional imaging will be scheduled.  BI-RADS CATEGORY  0: Incomplete. Need additional imaging evaluation and/or prior mammograms for comparison. Electronically Signed   By: Altamese Cabal M.D.   On: 10/31/2015 09:38   Mm Lt Breast Bx W Loc Dev 1st Lesion Image Bx Spec Stereo Guide  11/06/2015  CLINICAL DATA:  Recent diagnosis of invasive ductal carcinoma with DCIS in the left breast following ultrasound-guided biopsy of a small mass at 3:30 position. Stereotactic biopsy of a 11 mm group of calcifications slightly posterior, lateral, and superior to the biopsied mass was recommended. EXAM: LEFT BREAST STEREOTACTIC CORE NEEDLE BIOPSY COMPARISON:  Previous exams. FINDINGS: The patient and I discussed the procedure of stereotactic-guided biopsy including benefits and alternatives. We discussed the high likelihood of a successful procedure. We discussed the risks of the procedure including infection, bleeding, tissue injury, clip migration, and inadequate sampling. Informed written consent was given. The usual time out protocol was performed immediately prior to the procedure. Using sterile technique and 1% Lidocaine with and without epinephrine as local anesthetic, under stereotactic guidance, a 9 gauge vacuum assisted device was used to perform core needle biopsy of calcifications in the slightly upper outer posterior left third breast using a lateral to medial approach. Specimen radiograph was performed showing no definite calcifications. Therefore, the calcifications were re- targeted, and more tissue samples were taken. Specimen radiograph was then performed showing multiple calcifications within a few of the cores. Specimens with calcifications are identified for pathology. At the conclusion of the procedure, a coil shaped tissue marker clip was deployed into the biopsy cavity. Follow-up 2-view mammogram was performed and dictated separately. IMPRESSION: Stereotactic-guided biopsy of left breast. No apparent complications. Electronically Signed    By: Curlene Dolphin M.D.   On: 11/06/2015 12:04   Mm Lt Breast Bx W Loc Dev 1st Lesion Image Bx Spec Stereo Guide  11/01/2015  ADDENDUM REPORT: 11/01/2015 14:26 ADDENDUM: The pathology associated with the left breast ultrasound-guided core biopsy of the small mass located at the 3:30 o'clock position demonstrated invasive ductal carcinoma with DCIS felt to be grade 1. This is concordant. The pathology associated with the small group of calcifications located within the left breast at the 12:30 o'clock position (anterior and superior to the mass) demonstrated fibrocystic changes with calcifications and adenosis but no malignancy or atypia. This is also concordant. I have discussed the findings with patient and her husband and answered their questions. The patient's biopsy site is clean and dry without hematoma formation or signs of infection.  She is mild to moderately tender. The patient is currently scheduled on Monday 11/06/2015 for an additional stereotactic biopsy of the 11 mm group of microcalcifications located posterior and slightly lateral to the mass. The patient is also scheduled for the breast cancer multi disciplinary clinic on 11/08/2015. Post biopsy wound care instructions were reviewed with patient. Also, Norris Cross, nurse navigator reviewed educational materials with the patient. The patient was encouraged to call the breast center for additional questions or concerns. Electronically Signed   By: Altamese Cabal M.D.   On: 11/01/2015 14:26  11/01/2015  CLINICAL DATA:  Left breast mass with suspicious left breast calcifications. EXAM: LEFT BREAST STEREOTACTIC CORE NEEDLE BIOPSY COMPARISON:  Previous exams. FINDINGS: The patient and I discussed the procedure of stereotactic-guided biopsy including benefits and alternatives. We discussed the high likelihood of a successful procedure. We discussed the risks of the procedure including infection, bleeding, tissue injury, clip migration, and inadequate  sampling. Informed written consent was given. The usual time out protocol was performed immediately prior to the procedure. Using sterile technique and 1% lidocaine with epinephrine as local anesthetic, under stereotactic guidance, a 9 gauge vacuum assisted device was used to perform core needle biopsy of the small (4 mm in diameter) group of calcifications located within the upper outer quadrant of the left breast at the 12:30 o'clock position using a lateral approach. Specimen radiograph was performed showing representative calcifications within the specimen. Specimens with calcifications are identified for pathology. At the conclusion of the procedure, an X shaped tissue marker clip was deployed into the biopsy cavity. Follow-up 2-view mammogram was performed and dictated separately. IMPRESSION: Stereotactic-guided biopsy of the 4 mm group of left breast calcifications located at the 12:30 o'clock position as discussed above. No apparent complications. Electronically Signed: By: Altamese Cabal M.D. On: 10/31/2015 16:00   [place] Lt Breast Bx W Loc Dev 1st Lesion Img Bx Spec US Guide  11/01/2015  ADDENDUM REPORT: 11/01/2015 14:26 ADDENDUM: The pathology associated with the left breast ultrasound-guided core biopsy of the small mass located at the 3:30 o'clock position demonstrated invasive ductal carcinoma with DCIS felt to be grade 1. This is concordant. The pathology associated with the small group of calcifications located within the left breast at the 12:30 o'clock position (anterior and superior to the mass) demonstrated fibrocystic changes with calcifications and adenosis but no malignancy or atypia. This is also concordant. I have discussed the findings with patient and her husband and answered their questions. The patient's biopsy site is clean and dry without hematoma formation or signs of infection. She is mild to moderately tender. The patient is currently scheduled on Monday 11/06/2015 for an  additional stereotactic biopsy of the 11 mm group of microcalcifications located posterior and slightly lateral to the mass. The patient is also scheduled for the breast cancer multi disciplinary clinic on 11/08/2015. Post biopsy wound care instructions were reviewed with patient. Also, Norris Cross, nurse navigator reviewed educational materials with the patient. The patient was encouraged to call the breast center for additional questions or concerns. Electronically Signed   By: Altamese Cabal M.D.   On: 11/01/2015 14:26  11/01/2015  CLINICAL DATA:  Left breast mass. EXAM: ULTRASOUND GUIDED LEFT BREAST CORE NEEDLE BIOPSY COMPARISON:  Previous exam(s). FINDINGS: I met with the patient and we discussed the procedure of ultrasound-guided biopsy, including benefits and alternatives. We discussed the high likelihood of a successful procedure. We discussed the risks of the procedure, including infection, bleeding, tissue injury, clip migration,  and inadequate sampling. Informed written consent was given. The usual time-out protocol was performed immediately prior to the procedure. Using sterile technique and 1% Lidocaine as local anesthetic, under direct ultrasound visualization, a 14 gauge spring-loaded device was used to perform biopsy of the small irregular mass located within the left breast at the 3:30 o'clock position 2 cm from the nipple using a lateral approach. At the conclusion of the procedure a ribbon shaped tissue marker clip was deployed into the biopsy cavity. Follow up 2 view mammogram was performed and dictated separately. IMPRESSION: Ultrasound guided biopsy of the left breast mass located at the 3:30 o'clock position 2 cm from the nipple as discussed above. No apparent complications. Electronically Signed: By: Altamese Cabal M.D. On: 10/31/2015 14:36      IMPRESSION/PLAN: Stage I breast cancer  She has been discussed at our multidisciplinary tumor board.  The consensus is that she would be a  good candidate for breast conservation. I talked to her about the option of a mastectomy and informed her that her expected overall survival would be equivalent between mastectomy and breast conservation, based upon randomized controlled data. She is enthusiastic about breast conservation.    For the patient's early stage favorable risk breast cancer, we had a thorough discussion about her options for adjuvant therapy. One option would be antiestrogen therapy as discussed with medical oncology. She would take a pill for approximately 5 years.  The most aggressive option would be to pursue both modalities.  Of note, I discussed the data from the [employer] [facility] trial in the [place]. She understands that tamoxifen compared to radiation plus tamoxifen demonstrated no survival benefit among the women in this study. The women were 39 years or older with stage I estrogen receptor positive breast cancer. Based on this study, I told the patient that her overall life expectancy should not be affected by adding radiotherapy to antiestrogen medication. She understands that the main benefit of  adding radiotherapy to anti estrogen therapy would be a very small but measurable local control benefit (risk of local recurrence to be lowered from ~9% --> ~2% over a decade).  We discussed the fact that radiotherapy mainly provides a local control benefit.  We discussed the risks benefits and side effects of radiotherapy. She understands that the side effects would likely include some skin irritation and fatigue during the weeks of radiation. There is a risk of late effects which include but are not necessarily limited to cosmetic changes and rare lung or heart toxicity. We discussed the technology we use  to minimize risks. I would anticipate delivering approximately 4 weeks of radiotherapy.  I will make a recommendation after her final pathology is back.  If radiotherapy should be considered, we'll talk  again in my office post surgery. She is pleased with this plan. She knows she has a very good prognosis.   __________________________________________   Eppie Gibson, MD

## 2015-11-09 ENCOUNTER — Ambulatory Visit
Admission: RE | Admit: 2015-11-09 | Discharge: 2015-11-09 | Disposition: A | Discharge status: Home / Self Care | Payer: Commercial Managed Care - HMO | Source: Ambulatory Visit | Attending: General Surgery | Admitting: General Surgery

## 2015-11-09 ENCOUNTER — Encounter (HOSPITAL_BASED_OUTPATIENT_CLINIC_OR_DEPARTMENT_OTHER): Payer: Self-pay | Admitting: *Deleted

## 2015-11-09 DIAGNOSIS — C50512 Malignant neoplasm of lower-outer quadrant of left female breast: Secondary | ICD-10-CM

## 2015-11-13 ENCOUNTER — Telehealth: Payer: Self-pay | Admitting: *Deleted

## 2015-11-13 NOTE — Telephone Encounter (Signed)
Left message for a return phone call to follow up from [facility] 11/08/15.

## 2015-11-15 ENCOUNTER — Telehealth: Payer: Self-pay | Admitting: Oncology

## 2015-11-15 NOTE — Telephone Encounter (Signed)
Left message for patient re f/u with [facility] 3/24 and bone density test at [facility] 3/13. Also confirmed existing appointment for genetics 3/20. Schedule mailed.

## 2015-11-21 ENCOUNTER — Encounter: Payer: Commercial Managed Care - HMO | Admitting: Genetic Counselor

## 2015-11-21 ENCOUNTER — Other Ambulatory Visit: Payer: Commercial Managed Care - HMO

## 2015-11-22 ENCOUNTER — Telehealth: Payer: Self-pay | Admitting: Genetic Counselor

## 2015-11-22 NOTE — Telephone Encounter (Signed)
Lt mess to reschedule 3/20 appt.

## 2015-11-24 ENCOUNTER — Telehealth: Payer: Self-pay | Admitting: Genetic Counselor

## 2015-11-24 NOTE — Telephone Encounter (Signed)
Pt's husband asked to call pt back due to her having questions regarding genetics

## 2015-11-27 ENCOUNTER — Ambulatory Visit (HOSPITAL_BASED_OUTPATIENT_CLINIC_OR_DEPARTMENT_OTHER)
Admission: RE | Admit: 2015-11-27 | Discharge: 2015-11-27 | Disposition: A | Discharge status: Home / Self Care | Payer: Commercial Managed Care - HMO | Source: Ambulatory Visit | Attending: General Surgery | Admitting: General Surgery

## 2015-11-27 ENCOUNTER — Encounter (HOSPITAL_BASED_OUTPATIENT_CLINIC_OR_DEPARTMENT_OTHER)
Admission: RE | Disposition: A | Discharge status: Home / Self Care | Payer: Self-pay | Source: Ambulatory Visit | Attending: General Surgery

## 2015-11-27 ENCOUNTER — Ambulatory Visit
Admission: RE | Admit: 2015-11-27 | Discharge: 2015-11-27 | Disposition: A | Discharge status: Home / Self Care | Payer: Commercial Managed Care - HMO | Source: Ambulatory Visit | Attending: General Surgery | Admitting: General Surgery

## 2015-11-27 ENCOUNTER — Ambulatory Visit (HOSPITAL_COMMUNITY)
Admission: RE | Admit: 2015-11-27 | Discharge: 2015-11-27 | Disposition: A | Discharge status: Home / Self Care | Payer: Commercial Managed Care - HMO | Source: Ambulatory Visit | Attending: General Surgery | Admitting: General Surgery

## 2015-11-27 ENCOUNTER — Ambulatory Visit (HOSPITAL_BASED_OUTPATIENT_CLINIC_OR_DEPARTMENT_OTHER): Payer: Commercial Managed Care - HMO | Admitting: Anesthesiology

## 2015-11-27 ENCOUNTER — Encounter (HOSPITAL_BASED_OUTPATIENT_CLINIC_OR_DEPARTMENT_OTHER): Payer: Self-pay | Admitting: Anesthesiology

## 2015-11-27 DIAGNOSIS — Z87891 Personal history of nicotine dependence: Secondary | ICD-10-CM | POA: Diagnosis not present

## 2015-11-27 DIAGNOSIS — C50512 Malignant neoplasm of lower-outer quadrant of left female breast: Secondary | ICD-10-CM

## 2015-11-27 DIAGNOSIS — Z8041 Family history of malignant neoplasm of ovary: Secondary | ICD-10-CM | POA: Insufficient documentation

## 2015-11-27 DIAGNOSIS — Z170 Estrogen receptor positive status [ER+]: Secondary | ICD-10-CM | POA: Diagnosis not present

## 2015-11-27 DIAGNOSIS — Z7982 Long term (current) use of aspirin: Secondary | ICD-10-CM | POA: Insufficient documentation

## 2015-11-27 DIAGNOSIS — Z79899 Other long term (current) drug therapy: Secondary | ICD-10-CM | POA: Diagnosis not present

## 2015-11-27 DIAGNOSIS — K219 Gastro-esophageal reflux disease without esophagitis: Secondary | ICD-10-CM | POA: Diagnosis not present

## 2015-11-27 DIAGNOSIS — C50919 Malignant neoplasm of unspecified site of unspecified female breast: Secondary | ICD-10-CM | POA: Diagnosis present

## 2015-11-27 DIAGNOSIS — J45909 Unspecified asthma, uncomplicated: Secondary | ICD-10-CM | POA: Insufficient documentation

## 2015-11-27 DIAGNOSIS — N63 Unspecified lump in breast: Secondary | ICD-10-CM | POA: Diagnosis not present

## 2015-11-27 DIAGNOSIS — C50911 Malignant neoplasm of unspecified site of right female breast: Secondary | ICD-10-CM | POA: Diagnosis not present

## 2015-11-27 DIAGNOSIS — C50912 Malignant neoplasm of unspecified site of left female breast: Secondary | ICD-10-CM | POA: Diagnosis not present

## 2015-11-27 DIAGNOSIS — N62 Hypertrophy of breast: Secondary | ICD-10-CM | POA: Diagnosis not present

## 2015-11-27 DIAGNOSIS — G8918 Other acute postprocedural pain: Secondary | ICD-10-CM | POA: Diagnosis not present

## 2015-11-27 DIAGNOSIS — E079 Disorder of thyroid, unspecified: Secondary | ICD-10-CM | POA: Diagnosis not present

## 2015-11-27 DIAGNOSIS — R079 Chest pain, unspecified: Secondary | ICD-10-CM | POA: Diagnosis not present

## 2015-11-27 HISTORY — PX: RADIOACTIVE SEED GUIDED PARTIAL MASTECTOMY WITH AXILLARY SENTINEL LYMPH NODE BIOPSY: SHX6520

## 2015-11-27 SURGERY — RADIOACTIVE SEED GUIDED PARTIAL MASTECTOMY WITH AXILLARY SENTINEL LYMPH NODE BIOPSY
Anesthesia: Regional | Site: Breast | Laterality: Left

## 2015-11-27 MED ORDER — MIDAZOLAM HCL 2 MG/2ML IJ SOLN
1.0000 mg | INTRAMUSCULAR | Status: DC | PRN
  Administered 2015-11-27: 1 mg via INTRAVENOUS

## 2015-11-27 MED ORDER — LIDOCAINE HCL (CARDIAC) 20 MG/ML IV SOLN
INTRAVENOUS | Status: DC | PRN
  Administered 2015-11-27: 50 mg via INTRAVENOUS

## 2015-11-27 MED ORDER — GLYCOPYRROLATE 0.2 MG/ML IJ SOLN: 0.2000 mg | Freq: Once | INTRAMUSCULAR | Status: DC | PRN

## 2015-11-27 MED ORDER — ATROPINE SULFATE 0.4 MG/ML IJ SOLN
INTRAMUSCULAR | Status: AC
  Filled 2015-11-27: qty 1

## 2015-11-27 MED ORDER — GLYCOPYRROLATE 0.2 MG/ML IJ SOLN
INTRAMUSCULAR | Status: AC
  Filled 2015-11-27: qty 1

## 2015-11-27 MED ORDER — SUCCINYLCHOLINE CHLORIDE 20 MG/ML IJ SOLN
INTRAMUSCULAR | Status: AC
  Filled 2015-11-27: qty 1

## 2015-11-27 MED ORDER — FENTANYL CITRATE (PF) 100 MCG/2ML IJ SOLN
INTRAMUSCULAR | Status: AC
  Filled 2015-11-27: qty 2

## 2015-11-27 MED ORDER — SODIUM CHLORIDE 0.9 % IJ SOLN
INTRAMUSCULAR | Status: AC
  Filled 2015-11-27: qty 10

## 2015-11-27 MED ORDER — PROPOFOL 10 MG/ML IV BOLUS
INTRAVENOUS | Status: DC | PRN
  Administered 2015-11-27: 150 mg via INTRAVENOUS

## 2015-11-27 MED ORDER — EPHEDRINE SULFATE 50 MG/ML IJ SOLN
INTRAMUSCULAR | Status: AC
  Filled 2015-11-27: qty 2

## 2015-11-27 MED ORDER — LACTATED RINGERS IV SOLN
INTRAVENOUS | Status: DC
  Administered 2015-11-27 (×2): via INTRAVENOUS

## 2015-11-27 MED ORDER — TECHNETIUM TC 99M SULFUR COLLOID FILTERED
1.0000 | Freq: Once | INTRAVENOUS | Status: AC | PRN
  Administered 2015-11-27: 1 via INTRADERMAL

## 2015-11-27 MED ORDER — BUPIVACAINE-EPINEPHRINE (PF) 0.5% -1:200000 IJ SOLN
INTRAMUSCULAR | Status: DC | PRN
  Administered 2015-11-27: 30 mL

## 2015-11-27 MED ORDER — EPHEDRINE SULFATE 50 MG/ML IJ SOLN
INTRAMUSCULAR | Status: AC
  Filled 2015-11-27: qty 1

## 2015-11-27 MED ORDER — DEXAMETHASONE SODIUM PHOSPHATE 10 MG/ML IJ SOLN
INTRAMUSCULAR | Status: AC
  Filled 2015-11-27: qty 1

## 2015-11-27 MED ORDER — ONDANSETRON HCL 4 MG/2ML IJ SOLN
INTRAMUSCULAR | Status: AC
  Filled 2015-11-27: qty 2

## 2015-11-27 MED ORDER — HYDROMORPHONE HCL 1 MG/ML IJ SOLN: 0.2500 mg | INTRAMUSCULAR | Status: DC | PRN

## 2015-11-27 MED ORDER — CEFAZOLIN SODIUM-DEXTROSE 2-3 GM-% IV SOLR
INTRAVENOUS | Status: AC
  Filled 2015-11-27: qty 50

## 2015-11-27 MED ORDER — LIDOCAINE HCL (CARDIAC) 20 MG/ML IV SOLN
INTRAVENOUS | Status: AC
  Filled 2015-11-27: qty 5

## 2015-11-27 MED ORDER — SCOPOLAMINE 1 MG/3DAYS TD PT72: 1.0000 | MEDICATED_PATCH | Freq: Once | TRANSDERMAL | Status: DC | PRN

## 2015-11-27 MED ORDER — DEXAMETHASONE SODIUM PHOSPHATE 4 MG/ML IJ SOLN
INTRAMUSCULAR | Status: DC | PRN
  Administered 2015-11-27: 10 mg via INTRAVENOUS

## 2015-11-27 MED ORDER — FENTANYL CITRATE (PF) 100 MCG/2ML IJ SOLN
50.0000 ug | INTRAMUSCULAR | Status: AC | PRN
  Administered 2015-11-27: 25 ug via INTRAVENOUS
  Administered 2015-11-27: 50 ug via INTRAVENOUS
  Administered 2015-11-27: 100 ug via INTRAVENOUS
  Administered 2015-11-27: 25 ug via INTRAVENOUS
  Administered 2015-11-27: 100 ug via INTRAVENOUS

## 2015-11-27 MED ORDER — PROPOFOL 10 MG/ML IV BOLUS
INTRAVENOUS | Status: AC
  Filled 2015-11-27: qty 40

## 2015-11-27 MED ORDER — ONDANSETRON HCL 4 MG/2ML IJ SOLN
INTRAMUSCULAR | Status: DC | PRN
  Administered 2015-11-27: 4 mg via INTRAVENOUS

## 2015-11-27 MED ORDER — HYDROCODONE-ACETAMINOPHEN 10-325 MG PO TABS: 1.0000 | ORAL_TABLET | Freq: Four times a day (QID) | ORAL | Status: DC | PRN

## 2015-11-27 MED ORDER — CEFAZOLIN SODIUM-DEXTROSE 2-3 GM-% IV SOLR
2.0000 g | INTRAVENOUS | Status: AC
  Administered 2015-11-27: 2 g via INTRAVENOUS

## 2015-11-27 MED ORDER — MIDAZOLAM HCL 2 MG/2ML IJ SOLN
INTRAMUSCULAR | Status: AC
  Filled 2015-11-27: qty 2

## 2015-11-27 MED ORDER — METHYLENE BLUE 0.5 % INJ SOLN
INTRAVENOUS | Status: AC
  Filled 2015-11-27: qty 10

## 2015-11-27 MED ORDER — EPHEDRINE SULFATE 50 MG/ML IJ SOLN
INTRAMUSCULAR | Status: DC | PRN
  Administered 2015-11-27 (×2): 10 mg via INTRAVENOUS

## 2015-11-27 MED ORDER — PHENYLEPHRINE HCL 10 MG/ML IJ SOLN
INTRAMUSCULAR | Status: AC
  Filled 2015-11-27: qty 1

## 2015-11-27 MED ORDER — BUPIVACAINE HCL (PF) 0.25 % IJ SOLN
INTRAMUSCULAR | Status: DC | PRN
  Administered 2015-11-27: 6.5 mL

## 2015-11-27 SURGICAL SUPPLY — 53 items
APPLIER CLIP 9.375 MED OPEN (MISCELLANEOUS) ×2
APR CLP MED 9.3 20 MLT OPN (MISCELLANEOUS) ×1
BINDER BREAST LRG (GAUZE/BANDAGES/DRESSINGS) IMPLANT
BINDER BREAST XLRG (GAUZE/BANDAGES/DRESSINGS) IMPLANT
BLADE SURG 15 STRL LF DISP TIS (BLADE) ×1 IMPLANT
BLADE SURG 15 STRL SS (BLADE) ×2
CANISTER SUC SOCK COL 7IN (MISCELLANEOUS) IMPLANT
CANISTER SUCT 1200ML W/VALVE (MISCELLANEOUS) IMPLANT
CHLORAPREP W/TINT 26ML (MISCELLANEOUS) ×2 IMPLANT
CLIP APPLIE 9.375 MED OPEN (MISCELLANEOUS) ×1 IMPLANT
COVER BACK TABLE 60X90IN (DRAPES) ×2 IMPLANT
COVER MAYO STAND STRL (DRAPES) ×2 IMPLANT
COVER PROBE W GEL 5X96 (DRAPES) ×2 IMPLANT
DECANTER SPIKE VIAL GLASS SM (MISCELLANEOUS) IMPLANT
DEVICE DUBIN W/COMP PLATE 8390 (MISCELLANEOUS) ×2 IMPLANT
DRAPE LAPAROSCOPIC ABDOMINAL (DRAPES) ×2 IMPLANT
DRAPE UTILITY XL STRL (DRAPES) ×2 IMPLANT
ELECT COATED BLADE 2.86 ST (ELECTRODE) ×2 IMPLANT
ELECT REM PT RETURN 9FT ADLT (ELECTROSURGICAL) ×2
ELECTRODE REM PT RTRN 9FT ADLT (ELECTROSURGICAL) ×1 IMPLANT
GLOVE BIO SURGEON STRL SZ7 (GLOVE) ×5 IMPLANT
GLOVE BIOGEL M 7.0 STRL (GLOVE) ×1 IMPLANT
GLOVE BIOGEL PI IND STRL 7.5 (GLOVE) ×1 IMPLANT
GLOVE BIOGEL PI INDICATOR 7.5 (GLOVE) ×1
GOWN STRL REUS W/ TWL LRG LVL3 (GOWN DISPOSABLE) ×2 IMPLANT
GOWN STRL REUS W/TWL LRG LVL3 (GOWN DISPOSABLE) ×4
ILLUMINATOR WAVEGUIDE N/F (MISCELLANEOUS) ×1 IMPLANT
KIT MARKER MARGIN INK (KITS) ×2 IMPLANT
LIQUID BAND (GAUZE/BANDAGES/DRESSINGS) ×4 IMPLANT
NDL HYPO 25X1 1.5 SAFETY (NEEDLE) ×1 IMPLANT
NDL SAFETY ECLIPSE 18X1.5 (NEEDLE) IMPLANT
NEEDLE HYPO 18GX1.5 SHARP (NEEDLE)
NEEDLE HYPO 25X1 1.5 SAFETY (NEEDLE) ×2 IMPLANT
NS IRRIG 1000ML POUR BTL (IV SOLUTION) IMPLANT
PACK BASIN DAY SURGERY FS (CUSTOM PROCEDURE TRAY) ×2 IMPLANT
PENCIL BUTTON HOLSTER BLD 10FT (ELECTRODE) ×2 IMPLANT
SLEEVE SCD COMPRESS KNEE MED (MISCELLANEOUS) ×2 IMPLANT
SPONGE LAP 4X18 X RAY DECT (DISPOSABLE) ×2 IMPLANT
STRIP CLOSURE SKIN 1/2X4 (GAUZE/BANDAGES/DRESSINGS) ×2 IMPLANT
SUT ETHILON 2 0 FS 18 (SUTURE) IMPLANT
SUT MNCRL AB 4-0 PS2 18 (SUTURE) ×2 IMPLANT
SUT MON AB 5-0 PS2 18 (SUTURE) ×1 IMPLANT
SUT SILK 2 0 SH (SUTURE) ×1 IMPLANT
SUT VIC AB 2-0 SH 27 (SUTURE) ×6
SUT VIC AB 2-0 SH 27XBRD (SUTURE) ×1 IMPLANT
SUT VIC AB 3-0 SH 27 (SUTURE) ×2
SUT VIC AB 3-0 SH 27X BRD (SUTURE) ×1 IMPLANT
SUT VIC AB 5-0 PS2 18 (SUTURE) IMPLANT
SYR CONTROL 10ML LL (SYRINGE) ×2 IMPLANT
TOWEL OR 17X24 6PK STRL BLUE (TOWEL DISPOSABLE) ×2 IMPLANT
TOWEL OR NON WOVEN STRL DISP B (DISPOSABLE) ×2 IMPLANT
TUBE CONNECTING 20X1/4 (TUBING) IMPLANT
YANKAUER SUCT BULB TIP NO VENT (SUCTIONS) IMPLANT

## 2015-11-27 NOTE — Interval H&P Note (Signed)
History and Physical Interval Note:  11/27/2015 10:56 AM  Autumn Frazier  has presented today for surgery, with the diagnosis of LEFT BREAST CANCER  The various methods of treatment have been discussed with the patient and family. After consideration of risks, benefits and other options for treatment, the patient has consented to  Procedure(s): LEFT BREAST SEED AND WIRE GUIDED LUMPECTOMY WITH LEFT AXILLARY [place] NODE BIOPSY (Left) as a surgical intervention .  The patient's history has been reviewed, patient examined, no change in status, stable for surgery.  I have reviewed the patient's chart and labs.  Questions were answered to the patient's satisfaction.     Eron Goble

## 2015-11-27 NOTE — Op Note (Signed)
Preoperative diagnosis:clinical stage I breast cancer Postoperative diagnosis: same as above Procedure: Right breast seed and wire guided lumpectomy, right axillary sentinel node biopsy Surgeon: Dr Serita Grammes EBL: minimal Anes: general with pectoral block Specimens right breast tissue marked with paint, additional medial and superior margins marked short superior, long lateral, double deep. Right axillary sentinel node with count of [id number] Complications none Drains none Sponge count correct Dispo to pacu stable  Indications: This is a 3 yof who presents with clinical stage I breast cancer as well as separate area of adh.  We discussed seed and wire guided lumpectomy along with sentinel node biopsy.  The seed and wire were placed preoperatively and I had these images in the OR.  Procedure: After informed consent was obtained the patient was taken to the operating room. She was given antibiotics. She was administered a pectoral block and technetium was given in the standard periareolar fashion. Sequential compression devices were on her legs. She was then placed under general anesthesia with an LMA. Then she was then prepped and draped in the standard sterile surgical fashion. Surgical timeout was then performed. I then made a periareolar incision in the right breast.  I then removed the seed with an attempt to get a clear margin.  The seed was at the medial margin and came from the specimen. This was sent separately.  I then brought the wire in from lateral and removed this in entirety.  The wire had migrated in a little farther.  The posterior margin is the muscle. The seed was confirmed radiologically and mammographically.  The clips were both in the specimen also. I did remove some additional medial and superior tissue also.   I placed clips in the cavity. I closed the deep tissue with 2-0 vicryl. The superficial tissue was closedwith 3-0 vicryl and the skin was then closed with 5-0  monocryl Glue and steristrips were applied. I made an axillary incision.  I went through the axillary fascia. I was able to locate a sentinel node with count listed above.There were no more palpable or  radioactive nodes present. I obtained hemostasis I then closed the fascia with 2-0 vicryl The skin was closed with 3-0 vicryl and 4-0 monocryl. Glue and steristrips were placed A breast binder was placed. She was extubated and transferred to recovery stable

## 2015-11-27 NOTE — Progress Notes (Signed)
Assisted Dr. Oren Bracket with left, ultrasound guided, pectoralis block. Side rails up, monitors on throughout procedure. See vital signs in flow sheet. Tolerated Procedure well.

## 2015-11-27 NOTE — Anesthesia Postprocedure Evaluation (Signed)
Anesthesia Post Note  Patient: KEERTI ROCKER  Procedure(s) Performed: Procedure(s) (LRB): LEFT BREAST SEED AND WIRE GUIDED LUMPECTOMY WITH LEFT AXILLARY LYMPH NODE BIOPSY (Left)  Patient location during evaluation: PACU Anesthesia Type: General Level of consciousness: awake and alert Pain management: pain level controlled Vital Signs Assessment: post-procedure vital signs reviewed and stable Respiratory status: spontaneous breathing, nonlabored ventilation and respiratory function stable Cardiovascular status: blood pressure returned to baseline and stable Postop Assessment: no signs of nausea or vomiting Anesthetic complications: no    Last Vitals:  Filed Vitals:   11/27/15 1415 11/27/15 1445  BP: 108/94 132/66  Pulse: 77 68  Temp:  36.1 C  Resp: 17 18    Last Pain:  Filed Vitals:   11/27/15 1453  PainSc: 0-No pain                 Heddy Vidana A

## 2015-11-27 NOTE — Anesthesia Procedure Notes (Addendum)
Anesthesia Regional Block:  Pectoralis block  Pre-Anesthetic Checklist: ,, timeout performed, Correct Patient, Correct Site, Correct Laterality, Correct Procedure, Correct Position, site marked, Risks and benefits discussed, pre-op evaluation, post-op pain management  Laterality: Left  Prep: Maximum Sterile Barrier Precautions used and chloraprep       Needles:  Injection technique: Single-shot  Needle Type: Echogenic Stimulator Needle     Needle Length: 10cm 10 cm Needle Gauge: 21 and 21 G    Additional Needles:  Procedures: ultrasound guided (picture in chart) Pectoralis block Narrative:  Start time: 11/27/2015 11:06 AM End time: 11/27/2015 11:16 AM Injection made incrementally with aspirations every 5 mL. Anesthesiologist: Roderic Palau  Additional Notes: 2% Lidocaine skin wheel.   Procedure Name: LMA Insertion Date/Time: 11/27/2015 11:59 AM Performed by: Melynda Ripple D Pre-anesthesia Checklist: Patient identified, Emergency Drugs available, Suction available and Patient being monitored Patient Re-evaluated:Patient Re-evaluated prior to inductionOxygen Delivery Method: Circle System Utilized Preoxygenation: Pre-oxygenation with 100% oxygen Intubation Type: IV induction Ventilation: Mask ventilation without difficulty LMA: LMA inserted LMA Size: 4.0 Grade View: Grade I Number of attempts: 1 Airway Equipment and Method: Bite block Placement Confirmation: positive ETCO2 Tube secured with: Tape Dental Injury: Teeth and Oropharynx as per pre-operative assessment

## 2015-11-27 NOTE — Discharge Instructions (Signed)
[facility] °Office Phone Number [phone] ° °POST OP INSTRUCTIONS ° °Always review your discharge instruction sheet given to you by the facility where your surgery was performed. ° °IF YOU HAVE DISABILITY OR FAMILY LEAVE FORMS, YOU MUST BRING THEM TO THE OFFICE FOR PROCESSING.  DO NOT GIVE THEM TO YOUR DOCTOR. ° °1. A prescription for pain medication may be given to you upon discharge.  Take your pain medication as prescribed, if needed.  If narcotic pain medicine is not needed, then you may take acetaminophen (Tylenol), naprosyn (Alleve) or ibuprofen (Advil) as needed. °2. Take your usually prescribed medications unless otherwise directed °3. If you need a refill on your pain medication, please contact your pharmacy.  They will contact our office to request authorization.  Prescriptions will not be filled after 5pm or on week-ends. °4. You should eat very light the first 24 hours after surgery, such as soup, crackers, pudding, etc.  Resume your normal diet the day after surgery. °5. Most patients will experience some swelling and bruising in the breast.  Ice packs and a good support bra will help.  Wear the breast binder provided or a sports bra for 72 hours day and night.  After that wear a sports bra during the day until you return to the office. Swelling and bruising can take several days to resolve.  °6. It is common to experience some constipation if taking pain medication after surgery.  Increasing fluid intake and taking a stool softener will usually help or prevent this problem from occurring.  A mild laxative (Milk of Magnesia or Miralax) should be taken according to package directions if there are no bowel movements after 48 hours. °7. Unless discharge instructions indicate otherwise, you may remove your bandages 48 hours after surgery and you may shower at that time.  You may have steri-strips (small skin tapes) in place directly over the incision.  These strips should be left on the  skin for 7-10 days and will come off on their own.  If your surgeon used skin glue on the incision, you may shower in 24 hours.  The glue will flake off over the next 2-3 weeks.  Any sutures or staples will be removed at the office during your follow-up visit. °8. ACTIVITIES:  You may resume regular daily activities (gradually increasing) beginning the next day.  Wearing a good support bra or sports bra minimizes pain and swelling.  You may have sexual intercourse when it is comfortable. °a. You may drive when you no longer are taking prescription pain medication, you can comfortably wear a seatbelt, and you can safely maneuver your car and apply brakes. °b. RETURN TO WORK:  ______________________________________________________________________________________ °9. You should see your doctor in the office for a follow-up appointment approximately two weeks after your surgery.  Your doctor’s nurse will typically make your follow-up appointment when she calls you with your pathology report.  Expect your pathology report 3-4 business days after your surgery.  You may call to check if you do not hear from us after three days. °10. OTHER INSTRUCTIONS: _______________________________________________________________________________________________ _____________________________________________________________________________________________________________________________________ °_____________________________________________________________________________________________________________________________________ °_____________________________________________________________________________________________________________________________________ ° °WHEN TO CALL DR WAKEFIELD: °1. Fever over 101.0 °2. Nausea and/or vomiting. °3. Extreme swelling or bruising. °4. Continued bleeding from incision. °5. Increased pain, redness, or drainage from the incision. ° °The clinic staff is available to answer your questions during regular  business hours.  Please don’t hesitate to call and ask to speak to one of the nurses for clinical concerns.  If   you have a medical emergency, go to the nearest emergency room or call 911.  A surgeon from [facility] is always on call at the hospital. ° °For further questions, please visit [email or web address] ° ° ° °Post Anesthesia Home Care Instructions ° °Activity: °Get plenty of rest for the remainder of the day. A responsible adult should stay with you for 24 hours following the procedure.  °For the next 24 hours, DO NOT: °-Drive a car °-Operate machinery °-Drink alcoholic beverages °-Take any medication unless instructed by your physician °-Make any legal decisions or sign important papers. ° °Meals: °Start with liquid foods such as gelatin or soup. Progress to regular foods as tolerated. Avoid greasy, spicy, heavy foods. If nausea and/or vomiting occur, drink only clear liquids until the nausea and/or vomiting subsides. Call your physician if vomiting continues. ° °Special Instructions/Symptoms: °Your throat may feel dry or sore from the anesthesia or the breathing tube placed in your throat during surgery. If this causes discomfort, gargle with warm salt water. The discomfort should disappear within 24 hours. ° °If you had a scopolamine patch placed behind your ear for the management of post- operative nausea and/or vomiting: ° °1. The medication in the patch is effective for 72 hours, after which it should be removed.  Wrap patch in a tissue and discard in the trash. Wash hands thoroughly with soap and water. °2. You may remove the patch earlier than 72 hours if you experience unpleasant side effects which may include dry mouth, dizziness or visual disturbances. °3. Avoid touching the patch. Wash your hands with soap and water after contact with the patch. °  ° °

## 2015-11-27 NOTE — Transfer of Care (Signed)
Immediate Anesthesia Transfer of Care Note  Patient: Autumn Frazier  Procedure(s) Performed: Procedure(s): LEFT BREAST SEED AND WIRE GUIDED LUMPECTOMY WITH LEFT AXILLARY LYMPH NODE BIOPSY (Left)  Patient Location: PACU  Anesthesia Type:GA combined with regional for post-op pain  Level of Consciousness: awake and patient cooperative  Airway & Oxygen Therapy: Patient Spontanous Breathing and Patient connected to face mask oxygen  Post-op Assessment: Report given to RN and Post -op Vital signs reviewed and stable  Post vital signs: Reviewed and stable  Last Vitals:  Filed Vitals:   11/27/15 1120 11/27/15 1310  BP:  128/71  Pulse: 91   Temp:    Resp: 17     Complications: No apparent anesthesia complications

## 2015-11-27 NOTE — Anesthesia Preprocedure Evaluation (Addendum)
Anesthesia Evaluation  Patient identified by MRN, date of birth, ID band Patient awake    Reviewed: Allergy & Precautions, H&P , NPO status , Patient's Chart, lab work & pertinent test results  Airway Mallampati: III  TM Distance: >3 FB Neck ROM: Full    Dental no notable dental hx. (+) Teeth Intact, Dental Advisory Given   Pulmonary neg pulmonary ROS, former smoker,    Pulmonary exam normal breath sounds clear to auscultation       Cardiovascular negative cardio ROS   Rhythm:Regular Rate:Normal     Neuro/Psych negative neurological ROS  negative psych ROS   GI/Hepatic negative GI ROS, Neg liver ROS,   Endo/Other  negative endocrine ROS  Renal/GU negative Renal ROS  negative genitourinary   Musculoskeletal   Abdominal   Peds  Hematology negative hematology ROS (+)   Anesthesia Other Findings   Reproductive/Obstetrics negative OB ROS                            Anesthesia Physical Anesthesia Plan  ASA: II  Anesthesia Plan: General and Regional   Post-op Pain Management: GA combined w/ Regional for post-op pain   Induction: Intravenous  Airway Management Planned: LMA  Additional Equipment:   Intra-op Plan:   Post-operative Plan: Extubation in OR  Informed Consent: I have reviewed the patients History and Physical, chart, labs and discussed the procedure including the risks, benefits and alternatives for the proposed anesthesia with the patient or authorized representative who has indicated his/her understanding and acceptance.   Dental advisory given  Plan Discussed with: CRNA  Anesthesia Plan Comments:         Anesthesia Quick Evaluation

## 2015-11-27 NOTE — H&P (Signed)
75 yof who has prior history of a right breast biopsy and a couple left breast core biopsies. she has family history of ovarian cancer. she underwent screening mm that showed density b breasts. there was possible mass and calcs on the initial screen. she underwent additional views that shows a small ill defined mass at 4 oclock in the left breast. by mm this is 7 mm in size. in addition there is a small group of heterogenous calcs in the uoq at 1230. these span 4 mm and are 3 cm superior to the mass. there are also a faint group of calcs about 1 cm posterior and lateral to the mass. these calcs span 11 mm. the mass underwent US guided biopsy initially as well as the calcs that are 3 cm away. the posterior calcs were also biopsied eventually as well. the biopsy of these posterior calcs is adh. the biopsy of the other calcs that are 3 cm away are fcc and udh. the biopsy of the mass is grade I idc with dcis that is er/pr pos, her2 negative and ki is 10%. she is here with her husband today to discuss options. she is mother in law of Dr Shon Hale  Other Problems Conni Slipper, RN; 11/08/2015 8:00 AM) Asthma Bladder Problems Gastroesophageal Reflux Disease Kidney Stone Thyroid Disease  Past Surgical History Conni Slipper, RN; 11/08/2015 8:00 AM) Spinal Surgery - Lower Back Tonsillectomy  Diagnostic Studies History Conni Slipper, RN; 11/08/2015 8:00 AM) Colonoscopy 1-5 years ago Mammogram within last year Pap Smear 1-5 years ago  Medication History Conni Slipper, RN; 11/08/2015 8:00 AM) No Current Medications Medications Reconciled  Social History Conni Slipper, RN; 11/08/2015 8:00 AM) Alcohol use Moderate alcohol use. Caffeine use Carbonated beverages, Coffee, Tea. No drug use Tobacco use Former smoker.  Family History Conni Slipper, RN; 11/08/2015 8:00 AM) Alcohol Abuse Daughter. Hypertension Father.  Pregnancy / Birth History Conni Slipper, RN; 11/08/2015 8:00  AM) Age at menarche 41 years. Age of menopause 68-59 Contraceptive History Oral contraceptives. Gravida 3 Maternal age 22-25 Para 3  Review of Systems Conni Slipper RN; 11/08/2015 8:00 AM) General Not Present- Appetite Loss, Chills, Fatigue, Fever, Night Sweats, Weight Gain and Weight Loss. Skin Not Present- Change in Wart/Mole, Dryness, Hives, Jaundice, New Lesions, Non-Healing Wounds, Rash and Ulcer. HEENT Present- Hearing Loss and Wears glasses/contact lenses. Not Present- Earache, Hoarseness, Nose Bleed, Oral Ulcers, Ringing in the Ears, Seasonal Allergies, Sinus Pain, Sore Throat, Visual Disturbances and Yellow Eyes. Respiratory Present- Wheezing. Not Present- Bloody sputum, Chronic Cough, Difficulty Breathing and Snoring. Breast Not Present- Breast Mass, Breast Pain, Nipple Discharge and Skin Changes. Cardiovascular Not Present- Chest Pain, Difficulty Breathing Lying Down, Leg Cramps, Palpitations, Rapid Heart Rate, Shortness of Breath and Swelling of Extremities. Gastrointestinal Not Present- Abdominal Pain, Bloating, Bloody Stool, Change in Bowel Habits, Chronic diarrhea, Constipation, Difficulty Swallowing, Excessive gas, Gets full quickly at meals, Hemorrhoids, Indigestion, Nausea, Rectal Pain and Vomiting. Female Genitourinary Present- Urgency. Not Present- Frequency, Nocturia, Painful Urination and Pelvic Pain. Musculoskeletal Not Present- Back Pain, Joint Pain, Joint Stiffness, Muscle Pain, Muscle Weakness and Swelling of Extremities. Neurological Not Present- Decreased Memory, Fainting, Headaches, Numbness, Seizures, Tingling, Tremor, Trouble walking and Weakness. Psychiatric Not Present- Anxiety, Bipolar, Change in Sleep Pattern, Depression, Fearful and Frequent crying. Endocrine Not Present- Cold Intolerance, Excessive Hunger, Hair Changes, Heat Intolerance, Hot flashes and New Diabetes. Hematology Not Present- Easy Bruising, Excessive bleeding, Gland problems, HIV and  Persistent Infections.   Physical Exam Rolm Bookbinder MD; 11/08/2015  9:15 PM) General Mental Status-Alert. Orientation-OrientedX3. Chest and Lung Exam Chest and lung exam reveals -on auscultation, normal breath sounds, no adventitious sounds and normal vocal resonance. Breast Nipples-No Discharge. Breast Lump-No Palpable Breast Mass. Cardiovascular Cardiovascular examination reveals -normal heart sounds, regular rate and rhythm with no murmurs. Lymphatic Head & Neck General Head & Neck Lymphatics: Bilateral - Description - Normal. Axillary General Axillary Region: Bilateral - Description - Normal. Note: no [place] adenopathy  Assessment & Plan Rolm Bookbinder MD; 11/08/2015 9:18 PM) BREAST CANCER OF LOWER-OUTER QUADRANT OF LEFT FEMALE BREAST (C50.512) Story: Left breast seed and wire guided lumpectomy, left axillary sentinel node biopsy, genetics due to fh of ovarian cancer We discussed the staging and pathophysiology of breast cancer. We discussed all of the different options for treatment for breast cancer including surgery, chemotherapy, radiation therapy, Herceptin, and antiestrogen therapy. We discussed a sentinel lymph node biopsy as she does not appear to having lymph node involvement right now. We discussed the performance of that with injection of radioactive tracer . We discussed that she would have an incision underneath her axillary hairline or would be done through the same incision. We discussed that there is a chance of having a positive node with a sentinel lymph node biopsy and we will await the permanent pathology to make any other first further decisions in terms of her treatment. We discussed up to a 5% risk lifetime of chronic shoulder pain as well as lymphedema associated with a sentinel lymph node biopsy. We discussed the options for treatment of the breast cancer which included lumpectomy versus a mastectomy. We discussed the performance of the  lumpectomy with radioactive seed placement. We discussed a 5-10% chance of a positive margin requiring reexcision in the operating room. We also discussed that she might need radiation therapy if she undergoes lumpectomy. We discussed the mastectomy (removal of whole breast) and the postoperative care for that as well. Mastectomy can be followed by reconstruction. The decision for lumpectomy vs mastectomy has no impact on decision for chemotherapy. Most mastectomy patients will not need radiation therapy. We discussed that there is no difference in her survival whether she has mastectomy or lumpectomy the concordant biopsy does not need excision. the two areas in question will be marked with seed at mass and wire at calcs to excise the cancer and the area of adh together.

## 2015-11-28 ENCOUNTER — Encounter (HOSPITAL_BASED_OUTPATIENT_CLINIC_OR_DEPARTMENT_OTHER): Payer: Self-pay | Admitting: General Surgery

## 2015-12-04 ENCOUNTER — Ambulatory Visit
Admission: RE | Admit: 2015-12-04 | Discharge: 2015-12-04 | Disposition: A | Discharge status: Home / Self Care | Payer: Commercial Managed Care - HMO | Source: Ambulatory Visit | Attending: Oncology | Admitting: Oncology

## 2015-12-04 DIAGNOSIS — M8580 Other specified disorders of bone density and structure, unspecified site: Secondary | ICD-10-CM

## 2015-12-04 DIAGNOSIS — C50512 Malignant neoplasm of lower-outer quadrant of left female breast: Secondary | ICD-10-CM

## 2015-12-04 DIAGNOSIS — M8589 Other specified disorders of bone density and structure, multiple sites: Secondary | ICD-10-CM | POA: Diagnosis not present

## 2015-12-06 NOTE — Progress Notes (Signed)
Location of Breast Cancer: Left Breast  Histology per Pathology Report:  10/31/15 Diagnosis 1. Breast, left, needle core biopsy, 3:30 o'clock, US biopsy - INVASIVE DUCTAL CARCINOMA. - DUCTAL CARCINOMA IN SITU WITH NECROSIS AND CALCIFICATIONS. - SEE COMMENT. 2. Breast, left, needle core biopsy, 12:30 o'clock, affirm biopsy - FIBROCYSTIC CHANGES WITH FOCAL USUAL DUCTAL HYPERPLASIA, ADENOSIS AND ASSOCIATED CALCIFICATIONS. - NO ATYPIA OR MALIGNANCY IDENTIFIED.  11/06/15 Diagnosis Breast, left, needle core biopsy, upper outer quadrant - ATYPICAL DUCTAL HYPERPLASIA WITH CALCIFICATIONS.  11/27/15 Diagnosis 1. Breast, lumpectomy, Left - INVASIVE DUCTAL CARCINOMA, GRADE I/III, SPANNING 2.2 CM. - DUCTAL CARCINOMA IN SITU WITH CALCIFICATIONS, LOW GRADE. - INVASIVE CARCINOMA IS FOCALLY LESS THAN 0.1 CM TO THE POSTERIOR MARGIN OF SPECIMEN #1. - DUCTAL CARCINOMA IN SITU IS BROADLY LESS THAN 0.1 CM TO THE POSTERIOR MARGIN OF SPECIMEN # 1. - SEE ONCOLOGY TABLE BELOW. 2. Breast, excision, Left additional medial margin - BENIGN BREAST PARENCHYMA. - THERE IS NO EVIDENCE OF MALIGNANCY. - SEE COMMENT. 3. Breast, excision, Left additional superior margin - BENIGN BREAST PARENCHYMA. - THERE IS NO EVIDENCE OF MALIGNANCY. - SEE COMMENT. 4. Lymph node, sentinel, biopsy, Left axillary - THERE IS NO EVIDENCE OF CARCINOMA IN 1 OF 1 LYMPH NODE (0/1).  Receptor Status: ER(100%), PR (30%), Her2-neu (NEG)  Presentation noted after having a mammogram study.  Past/Anticipated interventions by surgeon, if any: Procedure: Right breast seed and wire guided lumpectomy, right axillary sentinel node biopsy Surgeon: Dr Serita Grammes on 11/27/15  Past/Anticipated interventions by medical oncology, if any: Per Dr. Virgie Dad note 11/08/15 ([facility]): -- consider adjuvant radiation -- adjuvant antiestrogen spelled follow local therapy  Lymphedema issues, if any:    Pain issues, if any:   SAFETY  ISSUES:  Prior radiation? No   Pacemaker/ICD? No  Possible current pregnancy? No  Is the patient on methotrexate? No  Current Complaints / other details:   Parity age 67, G33,P3, no BC nor HRT    Malmfelt, Stephani Police, RN 12/06/2015,3:37 PM

## 2015-12-08 ENCOUNTER — Encounter: Payer: Self-pay | Admitting: Genetic Counselor

## 2015-12-08 DIAGNOSIS — Z1379 Encounter for other screening for genetic and chromosomal anomalies: Secondary | ICD-10-CM | POA: Insufficient documentation

## 2015-12-11 ENCOUNTER — Telehealth: Payer: Self-pay | Admitting: *Deleted

## 2015-12-11 ENCOUNTER — Ambulatory Visit
Admission: RE | Admit: 2015-12-11 | Discharge: 2015-12-11 | Disposition: A | Discharge status: Home / Self Care | Payer: Commercial Managed Care - HMO | Source: Ambulatory Visit | Attending: Radiation Oncology | Admitting: Radiation Oncology

## 2015-12-11 ENCOUNTER — Encounter: Payer: Self-pay | Admitting: Radiation Oncology

## 2015-12-11 ENCOUNTER — Encounter: Payer: Commercial Managed Care - HMO | Admitting: Genetic Counselor

## 2015-12-11 ENCOUNTER — Other Ambulatory Visit: Payer: Self-pay | Admitting: Radiation Oncology

## 2015-12-11 ENCOUNTER — Other Ambulatory Visit: Payer: Commercial Managed Care - HMO

## 2015-12-11 VITALS — BP 121/59 | HR 79 | Temp 97.6°F | Ht 66.5 in | Wt 176.8 lb

## 2015-12-11 DIAGNOSIS — C50512 Malignant neoplasm of lower-outer quadrant of left female breast: Secondary | ICD-10-CM

## 2015-12-11 DIAGNOSIS — Z510 Encounter for antineoplastic radiation therapy: Secondary | ICD-10-CM | POA: Diagnosis not present

## 2015-12-11 DIAGNOSIS — Z170 Estrogen receptor positive status [ER+]: Secondary | ICD-10-CM | POA: Insufficient documentation

## 2015-12-11 DIAGNOSIS — Z853 Personal history of malignant neoplasm of breast: Secondary | ICD-10-CM

## 2015-12-11 NOTE — Telephone Encounter (Signed)
Called patient to inform of mammogram on 12-25-15 - arrival time - 1:30 pm @ [facility], lvm for a return call

## 2015-12-11 NOTE — Progress Notes (Signed)
Radiation Oncology         [phone] ________________________________  Name: Autumn Frazier MRN: [id number]  Date: 12/11/2015  DOB: 1941-04-09  Follow-Up Visit Note  Outpatient  CC: Autumn Screws, MD  Autumn Bookbinder, MD  Diagnosis:      ICD-9-CM ICD-10-CM   1. Breast cancer of lower-outer quadrant of left female breast (HCC) 174.5 C50.512   Stage II [id number] Left Breast LOQ Invasive Ductal Carcinoma with DCIS, ER+ / PR+ / Her2negative, Grade 1    Narrative:  The patient returns today for follow-up.     Since consultation, she underwent lumpectomy and SLN biopsy on 11-27-15. This revealed a 2.2 cm tumor with characteristics as above (orginal size of tumor and stage in path report was typo'd -- Dr Autumn Frazier informed me this morning her stage is indeed T2 and he'll amend the report) ; the single sentinel node was negative. Her margins are close but negative; Dr Autumn Frazier told me her posterior margin was at muscle and he took fascia; nothing else to take surgically.  She is recovering very well and has good ROM in her left shoulder.  No pain.  I spoke with Dr Autumn Frazier in Radiology about her mammogram and he recommended a post operative mammogram to rule out residual calcifications.    ALLERGIES:  has No Known Allergies.  Meds: Current Outpatient Prescriptions  Medication Sig Dispense Refill  . aspirin 81 MG tablet Take 81 mg by mouth daily.    Marland Kitchen b complex vitamins tablet Take 1 tablet by mouth daily.    . Biotin 5000 MCG TABS Take by mouth.    . Fluticasone-Salmeterol (ADVAIR) 100-50 MCG/DOSE AEPB Inhale 1 puff into the lungs every 12 (twelve) hours. 60 each 1  . levothyroxine (SYNTHROID, LEVOTHROID) 150 MCG tablet 150 mcg.  3  . Vitamin D, Ergocalciferol, (DRISDOL) 50000 units CAPS capsule Take 50,000 Units by mouth every 7 (seven) days.    Marland Kitchen levothyroxine (SYNTHROID, LEVOTHROID) 137 MCG tablet TK 1 T PO QD. DOSE CHANGE  3   No current facility-administered medications for this  encounter.    Physical Findings:  height is 5' 6.5" (1.689 m) and weight is 176 lb 12.8 oz (80.196 kg). Her temperature is 97.6 F (36.4 C). Her blood pressure is 121/59 and her pulse is 79. Marland Kitchen     General: Alert and oriented, in no acute distress HEENT: Head is normocephalic. Extraocular movements are intact.  Neck: Neck is supple, no palpable cervical or supraclavicular lymphadenopathy. Abdomen: Soft, nontender, nondistended, with no rigidity or guarding. Extremities: No cyanosis or edema. Lymphatics: see Neck Exam Musculoskeletal: god ROM in shoulders Neurologic: No obvious focalities. Speech is fluent.  Psychiatric: Judgment and insight are intact. Affect is appropriate. Breast exam reveals nice healing postoperatively in left breast No palpable right breast masses  Lab Findings: Lab Results  Component Value Date   WBC 7.5 11/08/2015   HGB 13.2 11/08/2015   HCT 41.6 11/08/2015   MCV 90.2 11/08/2015   PLT 232 11/08/2015       Radiographic Findings: Nm Sentinel Node Inj-no Rpt (breast)  11/27/2015  CLINICAL DATA: left axillary sentinel node biopsy Sulfur colloid was injected intradermally by the nuclear medicine technologist for breast cancer sentinel node localization.   Mm Breast Surgical Specimen  11/27/2015  CLINICAL DATA:  Post surgical excision of left breast malignancy and atypical ductal hyperplasia. EXAM: SPECIMEN RADIOGRAPH OF THE at the BREAST COMPARISON:  Previous exam(s). FINDINGS: Status post excision of the or breast. The  radioactive seed and biopsy marker clip are present, completely intact, and were marked for pathology. Also, the wire and coil shaped clip were also removed. In addition, the X shaped clip was removed. IMPRESSION: Specimen radiograph of the left breast. Electronically Signed   By: Autumn Frazier M.D.   On: 11/27/2015 12:45   Dg Bone Density  12/04/2015  EXAM: DUAL X-RAY ABSORPTIOMETRY (DXA) FOR BONE MINERAL DENSITY IMPRESSION: Referring  Physician:  Chauncey Frazier PATIENT: Name: Autumn Frazier Patient ID: [id number] Birth Date: [birth year] Height: 65.5 in. Sex: Female Measured: 12/04/2015 Weight: 172.8 lbs. Indications: Advanced Age, Breast Cancer History, Estrogen Deficient, Postmenopausal, Synthroid, Secondary Osteoporosis Fractures: Treatments: Vitamin D (E933.5) ASSESSMENT: The BMD measured at AP Spine L1-L4 (L2,L3) is 0.952 g/cm2 with a T-score of -1.9. This patient is considered OSTEOPENIC according to [facility]) criteria. L-2, L-3 were excluded due to degenerative changes. Site Region Measured Date Measured Age YA BMD Significant CHANGE T-score AP Spine L1-L4 (L2,L3) 12/04/2015 76.1 -1.9 0.952 g/cm2 DualFemur Neck Right 12/04/2015 76.1 -1.8 0.788 g/cm2 World Health Organization [facility]) criteria for post-menopausal, Caucasian Women: Normal       T-score at or above -1 SD Osteopenia   T-score between -1 and -2.5 SD Osteoporosis T-score at or below -2.5 SD RECOMMENDATION: [place] recommends that FDA-approved medical therapies be considered in postmenopausal women and men age 60 or older with a: 1. Hip or vertebral (clinical or morphometric) fracture. 2. T-score of <-2.5 at the spine or hip. 3. Ten-year fracture probability by FRAX of 3% or greater for hip fracture or 20% or greater for major osteoporotic fracture. All treatment decisions require clinical judgment and consideration of individual patient factors, including patient preferences, co-morbidities, previous drug use, risk factors not captured in the FRAX model (e.g. falls, vitamin D deficiency, increased bone turnover, interval significant decline in bone density) and possible under - or over-estimation of fracture risk by FRAX. All patients should ensure an adequate intake of dietary calcium (1200 mg/d) and vitamin D (800 IU daily) unless contraindicated. FOLLOW-UP: People with diagnosed cases of osteoporosis or at high risk for fracture  should have regular bone mineral density tests. For patients eligible for Medicare, routine testing is allowed once every 2 years. The testing frequency can be increased to one year for patients who have rapidly progressing disease, those who are receiving or discontinuing medical therapy to restore bone mass, or have additional risk factors. I have reviewed this report, and agree with the above findings. Mark A. Thornton Papas, M.D. [facility] FRAX* 10-year Probability of Fracture Based on femoral neck BMD: DualFemur (Right) Major Osteoporotic Fracture: 12.8% Hip Fracture:                3.1% Population:                  [place] (Caucasian) Risk Factors:                Secondary Osteoporosis *FRAX is a Materials engineer of the [employer] for Metabolic Bone Disease, a [place] (WHO) [facility]. ASSESSMENT: The probability of a major osteoporotic fracture is 12.8 % within the next ten years. The probability of a hip fracture is 3.1 % within the next ten years. I have reviewed this report and agree with the above findings. Mark A. Thornton Papas, M.D. [facility] Electronically Signed   By: Lavonia Dana M.D.   On: 12/04/2015 10:33   Mm Lt Radioactive Seed Loc  Mammo Guide  11/27/2015  CLINICAL DATA:  Preoperative needle localization for removal of left breast malignancy and atypical ductal hyperplasia. EXAM: MAMMOGRAPHIC GUIDED RADIOACTIVE SEED LOCALIZATION OF THE LEFT BREAST COMPARISON:  Previous exam(s). FINDINGS: Patient presents for radioactive seed localization prior to surgical excision of left breast malignancy. I met with the patient and we discussed the procedure of seed localization including benefits and alternatives. We discussed the high likelihood of a successful procedure. We discussed the risks of the procedure including infection, bleeding, tissue injury and further surgery. We discussed the low dose of radioactivity involved in the procedure.  Informed, written consent was given. The usual time-out protocol was performed immediately prior to the procedure. Using mammographic guidance, sterile technique, 1% lidocaine and an I-125 radioactive seed, the ribbon shaped clip with mass was localized using a lateral approach. The follow-up mammogram images confirm the seed in the expected location and were marked for Dr. Donne Frazier. Follow-up survey of the patient confirms presence of the radioactive seed. Order number of I-125 seed:  [id number]. Total activity:  1.700 millicuries  Reference Date: 11/16/2015 The patient tolerated the procedure well and was released from the [place]. She was given instructions regarding seed removal. IMPRESSION: Radioactive seed localization left breast. No apparent complications. Electronically Signed   By: Autumn Frazier M.D.   On: 11/27/2015 12:42   Mm Lt Plc Breast Loc Dev   1st Lesion  Inc Mammo Guide  11/27/2015  CLINICAL DATA:  Preoperative localization for removal of left breast malignancy and atypical ductal hyperplasia. EXAM: NEEDLE LOCALIZATION OF THE LEFT BREAST WITH MAMMO GUIDANCE COMPARISON:  Previous exams. FINDINGS: Patient presents for needle localization prior to surgical excision of left breast malignancy. I met with the patient and we discussed the procedure of needle localization including benefits and alternatives. We discussed the high likelihood of a successful procedure. We discussed the risks of the procedure, including infection, bleeding, tissue injury, and further surgery. Informed, written consent was given. The usual time-out protocol was performed immediately prior to the procedure. Using mammographic guidance, sterile technique, 1% lidocaine and a 9 cm modified Kopans needle, coil shaped clip was localized using lateral approach. The images were marked for Dr. Donne Frazier. IMPRESSION: Needle localization left breast. No apparent complications. Electronically Signed   By: Autumn Frazier  M.D.   On: 11/27/2015 12:44    Impression/Plan: Stage II left breast cancer. ER+ We discussed adjuvant radiotherapy today.  I recommend left breast radiotherapy in order to reduce risks of locoregional recurrence by 2/3.  Given that her tumor was larger than anticipated and given that her margins are somewhat close to invasive and in situ disease, I think it is appropriate to proceed with RT.  I'll order a post operative mammogram in early April to precede her simulation.  The risks, benefits and side effects of this treatment were discussed in detail.  She understands that radiotherapy is associated with skin irritation and fatigue in the acute setting. Late effects can include cosmetic changes and rare injury to internal organs.   She is enthusiastic about proceeding with treatment. A consent form has been  signed and placed in her chart.  I spent 30 minutes minutes face to face with the patient and more than 50% of that time was spent in counseling and/or coordination of care. _____________________________________   Eppie Gibson, MD

## 2015-12-15 ENCOUNTER — Telehealth: Payer: Self-pay | Admitting: Oncology

## 2015-12-15 ENCOUNTER — Ambulatory Visit (HOSPITAL_BASED_OUTPATIENT_CLINIC_OR_DEPARTMENT_OTHER): Payer: Commercial Managed Care - HMO | Admitting: Oncology

## 2015-12-15 VITALS — BP 125/59 | HR 61 | Temp 97.6°F | Resp 18 | Ht 66.5 in | Wt 173.9 lb

## 2015-12-15 DIAGNOSIS — Z170 Estrogen receptor positive status [ER+]: Secondary | ICD-10-CM

## 2015-12-15 DIAGNOSIS — M8580 Other specified disorders of bone density and structure, unspecified site: Secondary | ICD-10-CM | POA: Diagnosis not present

## 2015-12-15 DIAGNOSIS — C50512 Malignant neoplasm of lower-outer quadrant of left female breast: Secondary | ICD-10-CM | POA: Diagnosis not present

## 2015-12-15 NOTE — Progress Notes (Signed)
Autumn Frazier  [phone] [phone]     ID: Autumn Frazier DOB: [birth year]  MR#: [id number]  ZHY#:[id number]  Patient Care Team: Josetta Huddle, MD as PCP - General (Internal Medicine) Rolm Bookbinder, MD as Consulting Physician (General Surgery) Chauncey Cruel, MD as Consulting Physician (Oncology) Eppie Gibson, MD as Attending Physician (Radiation Oncology) Sylvan Cheese, NP as Nurse Practitioner (Hematology and Oncology) PCP: Henrine Screws, MD GYN: OTHER MD:  CHIEF COMPLAINT: Estrogen receptor positive breast cancer  CURRENT TREATMENT: adjuvant radiation pending   BREAST CANCER HISTORY: From the original intake note:   "Autumn Frazier" had routine screening mammography with tomography of the [facility] 10/31/2015. A possible mass associated with calcifications was noted in the left breast. Accordingly the patient proceeded to digital diagnostic left mammography and ultrasonography the same day. Breast density was category B. In the left breast that was a small ill-defined mass at approximately 4:00 measuring 7 mm mammographically. Also there was a small group of heterogeneous calcifications in a linear configuration in the upper outer quadrant of the left breast, at the 12:30 o'clock position. The calcifications spanned a 0.4 cm. There were approximately 3 cm superior to the mass. There was a second group of heterogeneous calcifications approximately 1 cm posterior and lateral to the mass. These spanned 1.1 cm. The distance between both groups of calcifications (with a mass in between) was 4.5 cm.  I examined her was no palpable abnormality. By ultrasonography there was a small irregular hypoechoic mass at the 3:30 o'clock position of the left breast measuring 0.6 cm maximally. Ultrasound of the axilla was unremarkable.  On the same day the patient underwent biopsy of the left breast mass in question and also the calcifications at the 12:30  o'clock position. The breast mass ([id number]) proved to be an invasive ductal carcinoma, grade 1, estrogen receptor 100% positive, progesterone receptor 30% positive, both with strong staining intensity, with an MIB-1 of 10%, and no HER-2/neu amplification, the signals ratio being 1.50 and the number Purcell 2.25.  The area of calcifications at 12:30 o'clock showed usual ductal hyperplasia without atypia.  On 11/06/2015 Autumn Frazier underwent biopsy of a second area of calcifications less than 2 cm away from the primary mass and this showed (S [id number]) atypical ductal hyperplasia.  Subsequent history is as detailed below.  INTERVAL HISTORY: Autumn Frazier returns today to review results of her left lumpectomy without sentinel lymph node sampling 11/27/2015. The final pathology ([id number]) confirmed invasive ductal carcinoma, grade 1, measuring 2.2 cm, with the single sentinel lymph node clear. Repeat HER-2 was again negative, with a signals ratio 1.56, and the number per cell of 1.95.  Margins were negative although the closest margin for in situ disease was broadly less than 0.1 cm posteriorly. This was discussed at conference and that margin is at fascia. At conference it was felt the patient would benefit from adjuvant radiation and specifically hypo-fractionated treatment was proposed. She will have a postop mammogram prior to that and then she will start her anti-estrogen treatment.   REVIEW OF SYSTEMS: Autumn Frazier did remarkably well with her surgery, with no significant pain, fever, bleeding, or other complications. A detailed review of systems today was otherwise entirely negative  PAST MEDICAL HISTORY: Past Medical History  Diagnosis Date  . Breast cancer of lower-outer quadrant of left female breast ([facility]) 11/02/2015  . Breast cancer ([facility])     PAST SURGICAL HISTORY: Past Surgical History  Procedure Laterality Date  . Tonsillectomy    .  Herniated disc    . Radioactive seed guided mastectomy with axillary  sentinel lymph node biopsy Left 11/27/2015    Procedure: LEFT BREAST SEED AND WIRE GUIDED LUMPECTOMY WITH LEFT AXILLARY LYMPH NODE BIOPSY;  Surgeon: Rolm Bookbinder, MD;  Location: [facility];  Service: General;  Laterality: Left;    FAMILY HISTORY Family History  Problem Relation Age of Onset  . Brain cancer Sister   . Ovarian cancer Maternal Aunt    the patient's father died from heart disease at the age of 11. The patient's mother died from heart failure at the age of 75. The patient had one brother who was shot in an accident at the age of 82. She has 1 sister. On the mother's side and aunt at age 59 was diagnosed with ovarian cancer. The patient's own sister was diagnosed with a brain tumor at the age of 13 and died 66 years later.  GYNECOLOGIC HISTORY:  No LMP recorded. Patient is postmenopausal. Menarche age 33, first live birth age 29, the patient is [place] P3. She stopped having periods in 1997. She never used hormone replacement. She never used oral contraceptives.  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 Her husband Silvestre Moment worked in Char[employer] fundraiser for many years and then started the [employer] here in town. He is now retired. Daughter Autumn Frazier is a homemaker in [place]. Son Autumn Frazier the third lives in [place] were he works in [place] for [employer]. Daughter Autumn Frazier lives in a [place] of where she works as an Immunologist. The patient has 10 grandchildren (7 blood and 3 step). She attends first [employer]: In place   HEALTH MAINTENANCE: Social History  Substance Use Topics  . Smoking status: Former Research scientist (life sciences)  . Smokeless tobacco: Not on file  . Alcohol Use: 1.8 oz/week    3 Glasses of wine per week     Colonoscopy: 2011?  PAP:  Bone density: remote  Lipid panel:  No Known Allergies  Current Outpatient Prescriptions  Medication Sig Dispense Refill  . aspirin 81 MG  tablet Take 81 mg by mouth daily.    Marland Kitchen b complex vitamins tablet Take 1 tablet by mouth daily.    . Biotin 5000 MCG TABS Take by mouth.    . Fluticasone-Salmeterol (ADVAIR) 100-50 MCG/DOSE AEPB Inhale 1 puff into the lungs every 12 (twelve) hours. 60 each 1  . levothyroxine (SYNTHROID, LEVOTHROID) 137 MCG tablet TK 1 T PO QD. DOSE CHANGE  3  . levothyroxine (SYNTHROID, LEVOTHROID) 150 MCG tablet 150 mcg.  3  . Vitamin D, Ergocalciferol, (DRISDOL) 50000 units CAPS capsule Take 50,000 Units by mouth every 7 (seven) days.     No current facility-administered medications for this visit.    OBJECTIVE: Middle-aged white woman in no acute distress Filed Vitals:   12/15/15 1002  BP: 125/59  Pulse: 61  Temp: 97.6 F (36.4 C)  Resp: 18     Body mass index is 27.65 kg/(m^2).    ECOG FS:0 - Asymptomatic  Sclerae unicteric, pupils round and equal Oropharynx clear and moist-- no thrush or other lesions No cervical or supraclavicular adenopathy Lungs no rales or rhonchi Heart regular rate and rhythm Abd soft, nontender, positive bowel sounds MSK no focal spinal tenderness, no upper extremity lymphedema Neuro: nonfocal, well oriented, appropriate affect Breasts: The right breast shows no suspicious masses or skin or nipple lesions. The left breast is  status post recent lumpectomy. The incision is healing very nicely, with no dehiscence, swelling, or erythema. The cosmetic result is excellent. The left axilla is benign.   LAB RESULTS:  CMP     Component Value Date/Time   NA 142 11/08/2015 1224   NA 140 02/23/2012 0930   K 4.0 11/08/2015 1224   K 4.3 02/23/2012 0930   CL 106 02/23/2012 0930   CO2 27 11/08/2015 1224   CO2 20 02/23/2012 0930   GLUCOSE 83 11/08/2015 1224   GLUCOSE 101* 02/23/2012 0930   BUN 16.8 11/08/2015 1224   BUN 17 02/23/2012 0930   CREATININE 0.8 11/08/2015 1224   CREATININE 0.73 02/23/2012 0930   CALCIUM 9.3 11/08/2015 1224   CALCIUM 9.5 02/23/2012 0930   PROT  6.5 11/08/2015 1224   PROT 6.2 04/26/2008 0220   ALBUMIN 3.5 11/08/2015 1224   ALBUMIN 3.7 04/26/2008 0220   AST 14 11/08/2015 1224   AST 28 04/26/2008 0220   ALT 17 11/08/2015 1224   ALT 16 04/26/2008 0220   ALKPHOS 96 11/08/2015 1224   ALKPHOS 73 04/26/2008 0220   BILITOT 0.33 11/08/2015 1224   BILITOT 1.0 04/26/2008 0220   GFRNONAA 83* 02/23/2012 0930   GFRAA >90 02/23/2012 0930    INo results found for: SPEP, UPEP  Lab Results  Component Value Date   WBC 7.5 11/08/2015   NEUTROABS 4.6 11/08/2015   HGB 13.2 11/08/2015   HCT 41.6 11/08/2015   MCV 90.2 11/08/2015   PLT 232 11/08/2015      Chemistry      Component Value Date/Time   NA 142 11/08/2015 1224   NA 140 02/23/2012 0930   K 4.0 11/08/2015 1224   K 4.3 02/23/2012 0930   CL 106 02/23/2012 0930   CO2 27 11/08/2015 1224   CO2 20 02/23/2012 0930   BUN 16.8 11/08/2015 1224   BUN 17 02/23/2012 0930   CREATININE 0.8 11/08/2015 1224   CREATININE 0.73 02/23/2012 0930      Component Value Date/Time   CALCIUM 9.3 11/08/2015 1224   CALCIUM 9.5 02/23/2012 0930   ALKPHOS 96 11/08/2015 1224   ALKPHOS 73 04/26/2008 0220   AST 14 11/08/2015 1224   AST 28 04/26/2008 0220   ALT 17 11/08/2015 1224   ALT 16 04/26/2008 0220   BILITOT 0.33 11/08/2015 1224   BILITOT 1.0 04/26/2008 0220       No results found for: LABCA2  No components found for: [id number]  No results for input(s): INR in the last 168 hours.  Urinalysis    Component Value Date/Time   COLORURINE RED BIOCHEMICALS MAY BE AFFECTED BY COLOR* 04/26/2008 0455   APPEARANCEUR CLOUDY* 04/26/2008 0455   LABSPEC 1.021 04/26/2008 0455   PHURINE 7.0 04/26/2008 0455   GLUCOSEU NEGATIVE 04/26/2008 0455   HGBUR LARGE* 04/26/2008 0455   BILIRUBINUR NEGATIVE 04/26/2008 0455   KETONESUR 15* 04/26/2008 0455   PROTEINUR 30* 04/26/2008 0455   UROBILINOGEN 1.0 04/26/2008 0455   NITRITE NEGATIVE 04/26/2008 0455   LEUKOCYTESUR MODERATE* 04/26/2008 0455       ELIGIBLE FOR AVAILABLE RESEARCH PROTOCOL: no  STUDIES: Nm Sentinel Node Inj-no Rpt (breast)  11/27/2015  CLINICAL DATA: left axillary sentinel node biopsy Sulfur colloid was injected intradermally by the nuclear medicine technologist for breast cancer sentinel node localization.   Mm Breast Surgical Specimen  11/27/2015  CLINICAL DATA:  Post surgical excision of left breast malignancy and atypical ductal hyperplasia. EXAM: SPECIMEN RADIOGRAPH OF THE at the BREAST COMPARISON:  Previous exam(s). FINDINGS: Status post excision of the or breast. The radioactive seed and biopsy marker clip are present, completely intact, and were marked for pathology. Also, the wire and coil shaped clip were also removed. In addition, the X shaped clip was removed. IMPRESSION: Specimen radiograph of the left breast. Electronically Signed   By: Altamese Cabal M.D.   On: 11/27/2015 12:45   Dg Bone Density  12/04/2015  EXAM: DUAL X-RAY ABSORPTIOMETRY (DXA) FOR BONE MINERAL DENSITY IMPRESSION: Referring Physician:  Chauncey Cruel PATIENT: Name: Autumn Frazier, Autumn Frazier Patient ID: [id number] Birth Date: Nov 08, 1940 Height: 65.5 in. Sex: Female Measured: 12/04/2015 Weight: 172.8 lbs. Indications: Advanced Age, Breast Cancer History, Estrogen Deficient, Postmenopausal, Synthroid, Secondary Osteoporosis Fractures: Treatments: Vitamin D (E933.5) ASSESSMENT: The BMD measured at AP Spine L1-L4 (L2,L3) is 0.952 g/cm2 with a T-score of -1.9. This patient is considered OSTEOPENIC according to [facility]) criteria. L-2, L-3 were excluded due to degenerative changes. Site Region Measured Date Measured Age YA BMD Significant CHANGE T-score AP Spine L1-L4 (L2,L3) 12/04/2015 [age] -1.8 0.952 g/cm2 DualFemur Neck Right 12/04/2015 76.1 -1.8 0.788 g/cm2 World Health Organization [facility]) criteria for post-menopausal, Caucasian Women: Normal       T-score at or above -1 SD Osteopenia   T-score between -1 and -2.5 SD Osteoporosis  T-score at or below -2.5 SD RECOMMENDATION: [place] recommends that FDA-approved medical therapies be considered in postmenopausal women and men age 97 or older with a: 1. Hip or vertebral (clinical or morphometric) fracture. 2. T-score of <-2.5 at the spine or hip. 3. Ten-year fracture probability by FRAX of 3% or greater for hip fracture or 20% or greater for major osteoporotic fracture. All treatment decisions require clinical judgment and consideration of individual patient factors, including patient preferences, co-morbidities, previous drug use, risk factors not captured in the FRAX model (e.g. falls, vitamin D deficiency, increased bone turnover, interval significant decline in bone density) and possible under - or over-estimation of fracture risk by FRAX. All patients should ensure an adequate intake of dietary calcium (1200 mg/d) and vitamin D (800 IU daily) unless contraindicated. FOLLOW-UP: People with diagnosed cases of osteoporosis or at high risk for fracture should have regular bone mineral density tests. For patients eligible for Medicare, routine testing is allowed once every 2 years. The testing frequency can be increased to one year for patients who have rapidly progressing disease, those who are receiving or discontinuing medical therapy to restore bone mass, or have additional risk factors. I have reviewed this report, and agree with the above findings. Mark A. Thornton Papas, M.D. [facility] FRAX* 10-year Probability of Fracture Based on femoral neck BMD: DualFemur (Right) Major Osteoporotic Fracture: 12.8% Hip Fracture:                3.1% Population:                  [place] (Caucasian) Risk Factors:                Secondary Osteoporosis *FRAX is a Materials engineer of the [employer] of [employer] for Metabolic Bone Disease, a [place] (WHO) [facility]. ASSESSMENT: The probability of a major osteoporotic fracture is 12.8 % within  the next ten years. The probability of a hip fracture is 3.1 % within the next ten years. I have reviewed this report and agree with the above findings. Mark A. Thornton Papas, M.D. [facility] Electronically Signed   By: Crist Infante.D.  On: 12/04/2015 10:33   Mm Lt Radioactive Seed Loc Mammo Guide  11/27/2015  CLINICAL DATA:  Preoperative needle localization for removal of left breast malignancy and atypical ductal hyperplasia. EXAM: MAMMOGRAPHIC GUIDED RADIOACTIVE SEED LOCALIZATION OF THE LEFT BREAST COMPARISON:  Previous exam(s). FINDINGS: Patient presents for radioactive seed localization prior to surgical excision of left breast malignancy. I met with the patient and we discussed the procedure of seed localization including benefits and alternatives. We discussed the high likelihood of a successful procedure. We discussed the risks of the procedure including infection, bleeding, tissue injury and further surgery. We discussed the low dose of radioactivity involved in the procedure. Informed, written consent was given. The usual time-out protocol was performed immediately prior to the procedure. Using mammographic guidance, sterile technique, 1% lidocaine and an I-125 radioactive seed, the ribbon shaped clip with mass was localized using a lateral approach. The follow-up mammogram images confirm the seed in the expected location and were marked for Dr. Donne Hazel. Follow-up survey of the patient confirms presence of the radioactive seed. Order number of I-125 seed:  [id number]. Total activity:  0.102 millicuries  Reference Date: 11/16/2015 The patient tolerated the procedure well and was released from the [facility]. She was given instructions regarding seed removal. IMPRESSION: Radioactive seed localization left breast. No apparent complications. Electronically Signed   By: Altamese Cabal M.D.   On: 11/27/2015 12:42   Mm Lt Plc Breast Loc Dev   1st Lesion  Inc Mammo Guide  11/27/2015  CLINICAL DATA:   Preoperative localization for removal of left breast malignancy and atypical ductal hyperplasia. EXAM: NEEDLE LOCALIZATION OF THE LEFT BREAST WITH MAMMO GUIDANCE COMPARISON:  Previous exams. FINDINGS: Patient presents for needle localization prior to surgical excision of left breast malignancy. I met with the patient and we discussed the procedure of needle localization including benefits and alternatives. We discussed the high likelihood of a successful procedure. We discussed the risks of the procedure, including infection, bleeding, tissue injury, and further surgery. Informed, written consent was given. The usual time-out protocol was performed immediately prior to the procedure. Using mammographic guidance, sterile technique, 1% lidocaine and a 9 cm modified Kopans needle, coil shaped clip was localized using lateral approach. The images were marked for Dr. Donne Hazel. IMPRESSION: Needle localization left breast. No apparent complications. Electronically Signed   By: Altamese Cabal M.D.   On: 11/27/2015 12:44    ASSESSMENT: 75 y.o. [place] woman status post left breast biopsy 10/31/2015 for a clinical T1b N0, stage IA  invasive ductal carcinoma, grade 1, estrogen and progesterone receptor positive, HER-2 nonamplified, with an MIB-1 of 10%  (a) biopsy of an area of calcifications 10/31/2015 showed usual ductal hyperplasia  (b) biopsy of a second area of calcifications 1.8 cm from the breast mass 11/06/2015 showed atypical ductal hyperplasia  (1) status post left lumpectomy 11/27/2015 for a pT2 cN0, stage IIA invasive ductal carcinoma, grade 1, repeat HER-2 again negative.  (2) adjuvant radiation in process  (3) adjuvant antiestrogen 2 follow local therapy  (4) the patient opted against genetic testing  PLAN: Autumn Frazier did remarkably well with her surgery. We reviewed her pathology today and she understands that her margins were close although negative. Specifically for DCIS a less than 1 mm margin  frequently would require re-resection, but since the margin is negative and at fascia, no further resection is needed. Also, since she will be receiving radiation, there is no correlation between closeness of the DCIS margin and risk of local recurrence,  Once she completes radiation she will start antiestrogen's and today we discussed the difference between tamoxifen and anastrozole in detail. She understands the possible toxicities, side effects and complications of these agents. She is deterred from the thought of tamoxifen because of concerns regarding endometrial carcinoma.  Accordingly we focused on anastrozole. The big issue there is, of course, is bone density loss. She already has moderate osteopenia. If she does tolerate anastrozole the plan will be to start her on denosumab every 6 months for the next 2 years. We also discussed the possible arthralgias and myalgias as well as other side effects of this agent.  The plan then will be for her to see me in about 3 months. She will have completed her local treatment by then. She will start anastrozole and if she can tolerate it, then 3 months later we will start the denosumab.  We also discussed genetics testing. She simply does not want to pursue that. She does not have any specific reason except that it's more than she wants to deal with right now. She knows that that option remains open to her in the future.  Autumn Frazier has a good understanding of this plan. She agrees with it. She will call with any problems that may develop before her next visit here.  Chauncey Cruel, MD   12/15/2015 10:24 AM Medical Oncology and Hematology [facility] [address] [place], [place] [address] Tel. [phone]    Fax. [phone]

## 2015-12-15 NOTE — Telephone Encounter (Signed)
appt made and avs printed °

## 2015-12-22 NOTE — Addendum Note (Signed)
Encounter addended by: Ernst Spell, RN on: 12/22/2015  9:29 AM<BR>     Documentation filed: Charges VN

## 2015-12-26 ENCOUNTER — Ambulatory Visit
Admission: RE | Admit: 2015-12-26 | Discharge: 2015-12-26 | Disposition: A | Discharge status: Home / Self Care | Payer: Commercial Managed Care - HMO | Source: Ambulatory Visit | Attending: Radiation Oncology | Admitting: Radiation Oncology

## 2015-12-26 DIAGNOSIS — R928 Other abnormal and inconclusive findings on diagnostic imaging of breast: Secondary | ICD-10-CM | POA: Diagnosis not present

## 2015-12-26 DIAGNOSIS — Z853 Personal history of malignant neoplasm of breast: Secondary | ICD-10-CM

## 2015-12-29 ENCOUNTER — Ambulatory Visit
Admission: RE | Admit: 2015-12-29 | Discharge: 2015-12-29 | Disposition: A | Discharge status: Home / Self Care | Payer: Commercial Managed Care - HMO | Source: Ambulatory Visit | Attending: Radiation Oncology | Admitting: Radiation Oncology

## 2015-12-29 DIAGNOSIS — C50512 Malignant neoplasm of lower-outer quadrant of left female breast: Secondary | ICD-10-CM

## 2015-12-29 DIAGNOSIS — Z510 Encounter for antineoplastic radiation therapy: Secondary | ICD-10-CM | POA: Diagnosis not present

## 2015-12-29 DIAGNOSIS — Z170 Estrogen receptor positive status [ER+]: Secondary | ICD-10-CM | POA: Diagnosis not present

## 2015-12-29 NOTE — Progress Notes (Signed)
  Radiation Oncology         [phone] ________________________________  Name: Autumn Frazier MRN: [id number]  Date: 12/29/2015  DOB: [birth year]  SIMULATION AND TREATMENT PLANNING NOTE    Outpatient  DIAGNOSIS:     ICD-9-CM ICD-10-CM   1. Breast cancer of lower-outer quadrant of left female breast ([place]) 174.5 C50.512     NARRATIVE:  The patient was brought to the [facility].  Identity was confirmed.  All relevant records and images related to the planned course of therapy were reviewed.  The patient freely provided informed written consent to proceed with treatment after reviewing the details related to the planned course of therapy. The consent form was witnessed and verified by the simulation staff.    Then, the patient was set-up in a stable reproducible supine position for radiation therapy with her ipsilateral arm over her head, and her upper body secured in a custom-made Vac-lok device.  CT images were obtained.  Surface markings were placed.  The CT images were loaded into the planning software.    TREATMENT PLANNING NOTE: Treatment planning then occurred.  The radiation prescription was entered and confirmed.     A total of 3 medically necessary complex treatment devices were fabricated and supervised by me: 2 fields with MLCs for custom blocks to protect heart, and lungs;  and, a Vac-lok. MORE COMPLEX DEVICES MAY BE MADE IN DOSIMETRY FOR FIELD IN FIELD BEAMS FOR DOSE HOMOGENEITY.  I have requested : 3D Simulation  I have requested a DVH of the following structures: lungs, heart, lumpectomy cavity.    The patient will receive 40.05 Gy in 15 fractions to the left breast with 2 tangential fields.   This will  be followed by a boost.  Optical Surface Tracking Plan:  Since intensity modulated radiotherapy (IMRT) and 3D conformal radiation treatment methods are predicated on accurate and precise positioning for treatment, intrafraction motion monitoring is medically  necessary to ensure accurate and safe treatment delivery. The ability to quantify intrafraction motion without excessive ionizing radiation dose can only be performed with optical surface tracking. Accordingly, surface imaging offers the opportunity to obtain 3D measurements of patient position throughout IMRT and 3D treatments without excessive radiation exposure. I am ordering optical surface tracking for this patient's upcoming course of radiotherapy.  ________________________________   Reference:  Ursula Alert, J, et al. Surface imaging-based analysis of intrafraction motion for breast radiotherapy patients.Journal of [place], n. 6, nov. 2014. ISSN [id number].  Available at: <http://www.jacmp.org/index.php/jacmp/article/view/4957>.    -----------------------------------  Eppie Gibson, MD

## 2016-01-01 DIAGNOSIS — Z510 Encounter for antineoplastic radiation therapy: Secondary | ICD-10-CM | POA: Diagnosis not present

## 2016-01-01 DIAGNOSIS — C50512 Malignant neoplasm of lower-outer quadrant of left female breast: Secondary | ICD-10-CM | POA: Diagnosis not present

## 2016-01-01 DIAGNOSIS — Z170 Estrogen receptor positive status [ER+]: Secondary | ICD-10-CM | POA: Diagnosis not present

## 2016-01-02 DIAGNOSIS — Z170 Estrogen receptor positive status [ER+]: Secondary | ICD-10-CM | POA: Diagnosis not present

## 2016-01-02 DIAGNOSIS — C50512 Malignant neoplasm of lower-outer quadrant of left female breast: Secondary | ICD-10-CM | POA: Diagnosis not present

## 2016-01-02 DIAGNOSIS — Z510 Encounter for antineoplastic radiation therapy: Secondary | ICD-10-CM | POA: Diagnosis not present

## 2016-01-08 ENCOUNTER — Ambulatory Visit
Admission: RE | Admit: 2016-01-08 | Discharge: 2016-01-08 | Disposition: A | Discharge status: Home / Self Care | Payer: Commercial Managed Care - HMO | Source: Ambulatory Visit | Attending: Radiation Oncology | Admitting: Radiation Oncology

## 2016-01-08 DIAGNOSIS — C50512 Malignant neoplasm of lower-outer quadrant of left female breast: Secondary | ICD-10-CM | POA: Diagnosis not present

## 2016-01-08 DIAGNOSIS — Z170 Estrogen receptor positive status [ER+]: Secondary | ICD-10-CM | POA: Diagnosis not present

## 2016-01-08 DIAGNOSIS — Z510 Encounter for antineoplastic radiation therapy: Secondary | ICD-10-CM | POA: Diagnosis not present

## 2016-01-09 ENCOUNTER — Ambulatory Visit
Admission: RE | Admit: 2016-01-09 | Discharge: 2016-01-09 | Disposition: A | Discharge status: Home / Self Care | Payer: Commercial Managed Care - HMO | Source: Ambulatory Visit | Attending: Radiation Oncology | Admitting: Radiation Oncology

## 2016-01-09 DIAGNOSIS — C50512 Malignant neoplasm of lower-outer quadrant of left female breast: Secondary | ICD-10-CM | POA: Diagnosis not present

## 2016-01-09 DIAGNOSIS — Z510 Encounter for antineoplastic radiation therapy: Secondary | ICD-10-CM | POA: Diagnosis not present

## 2016-01-09 DIAGNOSIS — Z170 Estrogen receptor positive status [ER+]: Secondary | ICD-10-CM | POA: Diagnosis not present

## 2016-01-09 MED ORDER — ALRA NON-METALLIC DEODORANT (RAD-ONC)
1.0000 "application " | Freq: Once | TOPICAL | Status: AC
  Administered 2016-01-09: 1 via TOPICAL

## 2016-01-09 MED ORDER — RADIAPLEXRX EX GEL
Freq: Once | CUTANEOUS | Status: AC
  Administered 2016-01-09: 10:00:00 via TOPICAL

## 2016-01-09 NOTE — Progress Notes (Signed)
Pt here for patient teaching.  Pt given Radiation and You booklet, skin care instructions, Alra deodorant and Radiaplex gel. Pt reports they have not watched the Radiation Therapy Education video, but were given the link to watch at home.  Reviewed areas of pertinence such as fatigue, skin changes, blurry vision, breast tenderness, breast swelling, cough, shortness of breath, earaches and taste changes . Pt able to give teach back of to pat skin, use unscented/gentle soap and drink plenty of water,apply Radiaplex bid, avoid applying anything to skin within 4 hours of treatment, avoid wearing an under wire bra and to use an electric razor if they must shave. Pt verbalizes understanding of information given and will contact nursing with any questions or concerns.     Http://rtanswers.org/treatmentinformation/whattoexpect/index

## 2016-01-10 ENCOUNTER — Ambulatory Visit
Admission: RE | Admit: 2016-01-10 | Discharge: 2016-01-10 | Disposition: A | Discharge status: Home / Self Care | Payer: Commercial Managed Care - HMO | Source: Ambulatory Visit | Attending: Radiation Oncology | Admitting: Radiation Oncology

## 2016-01-10 DIAGNOSIS — C50512 Malignant neoplasm of lower-outer quadrant of left female breast: Secondary | ICD-10-CM | POA: Diagnosis not present

## 2016-01-10 DIAGNOSIS — Z510 Encounter for antineoplastic radiation therapy: Secondary | ICD-10-CM | POA: Diagnosis not present

## 2016-01-10 DIAGNOSIS — Z170 Estrogen receptor positive status [ER+]: Secondary | ICD-10-CM | POA: Diagnosis not present

## 2016-01-11 ENCOUNTER — Ambulatory Visit
Admission: RE | Admit: 2016-01-11 | Discharge: 2016-01-11 | Disposition: A | Discharge status: Home / Self Care | Payer: Commercial Managed Care - HMO | Source: Ambulatory Visit | Attending: Radiation Oncology | Admitting: Radiation Oncology

## 2016-01-11 DIAGNOSIS — Z510 Encounter for antineoplastic radiation therapy: Secondary | ICD-10-CM | POA: Diagnosis not present

## 2016-01-11 DIAGNOSIS — Z170 Estrogen receptor positive status [ER+]: Secondary | ICD-10-CM | POA: Diagnosis not present

## 2016-01-11 DIAGNOSIS — C50512 Malignant neoplasm of lower-outer quadrant of left female breast: Secondary | ICD-10-CM | POA: Diagnosis not present

## 2016-01-12 ENCOUNTER — Ambulatory Visit
Admission: RE | Admit: 2016-01-12 | Discharge: 2016-01-12 | Disposition: A | Discharge status: Home / Self Care | Payer: Commercial Managed Care - HMO | Source: Ambulatory Visit | Attending: Radiation Oncology | Admitting: Radiation Oncology

## 2016-01-12 DIAGNOSIS — Z170 Estrogen receptor positive status [ER+]: Secondary | ICD-10-CM | POA: Diagnosis not present

## 2016-01-12 DIAGNOSIS — Z510 Encounter for antineoplastic radiation therapy: Secondary | ICD-10-CM | POA: Diagnosis not present

## 2016-01-12 DIAGNOSIS — C50512 Malignant neoplasm of lower-outer quadrant of left female breast: Secondary | ICD-10-CM | POA: Diagnosis not present

## 2016-01-15 ENCOUNTER — Ambulatory Visit
Admission: RE | Admit: 2016-01-15 | Discharge: 2016-01-15 | Disposition: A | Discharge status: Home / Self Care | Payer: Commercial Managed Care - HMO | Source: Ambulatory Visit | Attending: Radiation Oncology | Admitting: Radiation Oncology

## 2016-01-15 ENCOUNTER — Encounter: Payer: Self-pay | Admitting: Radiation Oncology

## 2016-01-15 VITALS — BP 125/73 | HR 81 | Temp 97.7°F | Ht 66.5 in | Wt 176.4 lb

## 2016-01-15 DIAGNOSIS — Z510 Encounter for antineoplastic radiation therapy: Secondary | ICD-10-CM | POA: Diagnosis not present

## 2016-01-15 DIAGNOSIS — Z170 Estrogen receptor positive status [ER+]: Secondary | ICD-10-CM | POA: Diagnosis not present

## 2016-01-15 DIAGNOSIS — C50512 Malignant neoplasm of lower-outer quadrant of left female breast: Secondary | ICD-10-CM | POA: Diagnosis not present

## 2016-01-15 NOTE — Progress Notes (Signed)
Autumn Frazier is here for her 5th fraction of radiation to her Left Breast. She denies fatigue or pain. She has had no changes to the skin over her Left Breast at this time. She is using the radiaplex cream twice daily as directed.   BP 125/73 mmHg  Pulse 81  Temp(Src) 97.7 F (36.5 C)  Ht 5' 6.5" (1.689 m)  Wt 176 lb 6.4 oz (80.015 kg)  BMI 28.05 kg/m2

## 2016-01-15 NOTE — Progress Notes (Signed)
   Weekly Management Note:  outpatient    ICD-9-CM ICD-10-CM   1. Breast cancer of lower-outer quadrant of left female breast (HCC) 174.5 C50.512     Current Dose:  13.35 Gy  Projected Dose: 50.05 Gy   Narrative:  The patient presents for routine under treatment assessment.  CBCT/MVCT images/Port film x-rays were reviewed.  The chart was checked. Doing well. No complaints.  Physical Findings:  height is 5' 6.5" (1.689 m) and weight is 176 lb 6.4 oz (80.015 kg). Her temperature is 97.7 F (36.5 C). Her blood pressure is 125/73 and her pulse is 81.   Wt Readings from Last 3 Encounters:  01/15/16 176 lb 6.4 oz (80.015 kg)  12/15/15 173 lb 14.4 oz (78.881 kg)  12/11/15 176 lb 12.8 oz (80.196 kg)   no skin changes over left breast  Impression:  The patient is tolerating radiotherapy.  Plan:  Continue radiotherapy as planned.    ________________________________   Eppie Gibson, M.D.

## 2016-01-16 ENCOUNTER — Ambulatory Visit
Admission: RE | Admit: 2016-01-16 | Discharge: 2016-01-16 | Disposition: A | Discharge status: Home / Self Care | Payer: Commercial Managed Care - HMO | Source: Ambulatory Visit | Attending: Radiation Oncology | Admitting: Radiation Oncology

## 2016-01-16 DIAGNOSIS — C50512 Malignant neoplasm of lower-outer quadrant of left female breast: Secondary | ICD-10-CM | POA: Diagnosis not present

## 2016-01-16 DIAGNOSIS — Z170 Estrogen receptor positive status [ER+]: Secondary | ICD-10-CM | POA: Diagnosis not present

## 2016-01-16 DIAGNOSIS — Z510 Encounter for antineoplastic radiation therapy: Secondary | ICD-10-CM | POA: Diagnosis not present

## 2016-01-17 ENCOUNTER — Telehealth: Payer: Self-pay | Admitting: *Deleted

## 2016-01-17 ENCOUNTER — Ambulatory Visit
Admission: RE | Admit: 2016-01-17 | Discharge: 2016-01-17 | Disposition: A | Discharge status: Home / Self Care | Payer: Commercial Managed Care - HMO | Source: Ambulatory Visit | Attending: Radiation Oncology | Admitting: Radiation Oncology

## 2016-01-17 ENCOUNTER — Encounter: Payer: Self-pay | Admitting: Radiation Oncology

## 2016-01-17 DIAGNOSIS — Z170 Estrogen receptor positive status [ER+]: Secondary | ICD-10-CM | POA: Diagnosis not present

## 2016-01-17 DIAGNOSIS — Z510 Encounter for antineoplastic radiation therapy: Secondary | ICD-10-CM | POA: Diagnosis not present

## 2016-01-17 DIAGNOSIS — C50512 Malignant neoplasm of lower-outer quadrant of left female breast: Secondary | ICD-10-CM | POA: Diagnosis not present

## 2016-01-17 NOTE — Telephone Encounter (Signed)
Left vm for pt to return call regarding needs during xrt. Contact information provided. 

## 2016-01-18 ENCOUNTER — Ambulatory Visit
Admission: RE | Admit: 2016-01-18 | Discharge: 2016-01-18 | Disposition: A | Discharge status: Home / Self Care | Payer: Commercial Managed Care - HMO | Source: Ambulatory Visit | Attending: Radiation Oncology | Admitting: Radiation Oncology

## 2016-01-18 DIAGNOSIS — Z170 Estrogen receptor positive status [ER+]: Secondary | ICD-10-CM | POA: Diagnosis not present

## 2016-01-18 DIAGNOSIS — Z510 Encounter for antineoplastic radiation therapy: Secondary | ICD-10-CM | POA: Diagnosis not present

## 2016-01-18 DIAGNOSIS — C50512 Malignant neoplasm of lower-outer quadrant of left female breast: Secondary | ICD-10-CM | POA: Diagnosis not present

## 2016-01-19 ENCOUNTER — Ambulatory Visit
Admission: RE | Admit: 2016-01-19 | Discharge: 2016-01-19 | Disposition: A | Discharge status: Home / Self Care | Payer: Commercial Managed Care - HMO | Source: Ambulatory Visit | Attending: Radiation Oncology | Admitting: Radiation Oncology

## 2016-01-19 DIAGNOSIS — Z170 Estrogen receptor positive status [ER+]: Secondary | ICD-10-CM | POA: Diagnosis not present

## 2016-01-19 DIAGNOSIS — C50512 Malignant neoplasm of lower-outer quadrant of left female breast: Secondary | ICD-10-CM | POA: Diagnosis not present

## 2016-01-19 DIAGNOSIS — Z510 Encounter for antineoplastic radiation therapy: Secondary | ICD-10-CM | POA: Diagnosis not present

## 2016-01-22 ENCOUNTER — Ambulatory Visit
Admission: RE | Admit: 2016-01-22 | Discharge: 2016-01-22 | Disposition: A | Discharge status: Home / Self Care | Payer: Commercial Managed Care - HMO | Source: Ambulatory Visit | Attending: Radiation Oncology | Admitting: Radiation Oncology

## 2016-01-22 ENCOUNTER — Encounter: Payer: Self-pay | Admitting: Radiation Oncology

## 2016-01-22 VITALS — BP 123/74 | HR 75 | Temp 98.0°F | Ht 66.5 in | Wt 176.6 lb

## 2016-01-22 DIAGNOSIS — C50512 Malignant neoplasm of lower-outer quadrant of left female breast: Secondary | ICD-10-CM | POA: Diagnosis not present

## 2016-01-22 DIAGNOSIS — Z170 Estrogen receptor positive status [ER+]: Secondary | ICD-10-CM | POA: Diagnosis not present

## 2016-01-22 DIAGNOSIS — Z510 Encounter for antineoplastic radiation therapy: Secondary | ICD-10-CM | POA: Diagnosis not present

## 2016-01-22 NOTE — Progress Notes (Signed)
   Weekly Management Note:  outpatient    ICD-9-CM ICD-10-CM   1. Breast cancer of lower-outer quadrant of left female breast (HCC) 174.5 C50.512     Current Dose:  26.76 Gy  Projected Dose: 50.05 Gy   Narrative:  The patient presents for routine under treatment assessment.  CBCT/MVCT images/Port film x-rays were reviewed.  The chart was checked. Doing well. No complaints.  Physical Findings:  height is 5' 6.5" (1.689 m) and weight is 176 lb 9.6 oz (80.105 kg). Her temperature is 98 F (36.7 C). Her blood pressure is 123/74 and her pulse is 75.   Wt Readings from Last 3 Encounters:  01/22/16 176 lb 9.6 oz (80.105 kg)  01/15/16 176 lb 6.4 oz (80.015 kg)  12/15/15 173 lb 14.4 oz (78.881 kg)   Mild hyperpigmentation over left breast  Impression:  The patient is tolerating radiotherapy.  Plan:  Continue radiotherapy as planned.    ________________________________   Eppie Gibson, M.D.

## 2016-01-22 NOTE — Progress Notes (Signed)
Autumn Frazier is here for her 10th fraction of radiation to her Left Breast. She denies fatigue and pain. The skin over her Left Breast is slightly red and hyperpigmented. She is using the Radiaplex cream twice daily as directed. She has no other concerns at this time.   BP 123/74 mmHg  Pulse 75  Temp(Src) 98 F (36.7 C)  Ht 5' 6.5" (1.689 m)  Wt 176 lb 9.6 oz (80.105 kg)  BMI 28.08 kg/m2

## 2016-01-23 ENCOUNTER — Ambulatory Visit
Admission: RE | Admit: 2016-01-23 | Discharge: 2016-01-23 | Disposition: A | Discharge status: Home / Self Care | Payer: Commercial Managed Care - HMO | Source: Ambulatory Visit | Attending: Radiation Oncology | Admitting: Radiation Oncology

## 2016-01-23 DIAGNOSIS — C50512 Malignant neoplasm of lower-outer quadrant of left female breast: Secondary | ICD-10-CM | POA: Diagnosis not present

## 2016-01-23 DIAGNOSIS — Z510 Encounter for antineoplastic radiation therapy: Secondary | ICD-10-CM | POA: Diagnosis not present

## 2016-01-23 DIAGNOSIS — Z170 Estrogen receptor positive status [ER+]: Secondary | ICD-10-CM | POA: Diagnosis not present

## 2016-01-24 ENCOUNTER — Ambulatory Visit
Admission: RE | Admit: 2016-01-24 | Discharge: 2016-01-24 | Disposition: A | Discharge status: Home / Self Care | Payer: Commercial Managed Care - HMO | Source: Ambulatory Visit | Attending: Radiation Oncology | Admitting: Radiation Oncology

## 2016-01-24 DIAGNOSIS — C50512 Malignant neoplasm of lower-outer quadrant of left female breast: Secondary | ICD-10-CM | POA: Diagnosis not present

## 2016-01-24 DIAGNOSIS — Z170 Estrogen receptor positive status [ER+]: Secondary | ICD-10-CM | POA: Diagnosis not present

## 2016-01-24 DIAGNOSIS — Z510 Encounter for antineoplastic radiation therapy: Secondary | ICD-10-CM | POA: Diagnosis not present

## 2016-01-25 ENCOUNTER — Ambulatory Visit
Admission: RE | Admit: 2016-01-25 | Discharge: 2016-01-25 | Disposition: A | Discharge status: Home / Self Care | Payer: Commercial Managed Care - HMO | Source: Ambulatory Visit | Attending: Radiation Oncology | Admitting: Radiation Oncology

## 2016-01-25 DIAGNOSIS — Z510 Encounter for antineoplastic radiation therapy: Secondary | ICD-10-CM | POA: Diagnosis not present

## 2016-01-25 DIAGNOSIS — C50512 Malignant neoplasm of lower-outer quadrant of left female breast: Secondary | ICD-10-CM | POA: Diagnosis not present

## 2016-01-25 DIAGNOSIS — Z170 Estrogen receptor positive status [ER+]: Secondary | ICD-10-CM | POA: Diagnosis not present

## 2016-01-26 ENCOUNTER — Ambulatory Visit
Admission: RE | Admit: 2016-01-26 | Discharge: 2016-01-26 | Disposition: A | Discharge status: Home / Self Care | Payer: Commercial Managed Care - HMO | Source: Ambulatory Visit | Attending: Radiation Oncology | Admitting: Radiation Oncology

## 2016-01-26 ENCOUNTER — Ambulatory Visit: Payer: Commercial Managed Care - HMO

## 2016-01-29 ENCOUNTER — Encounter: Payer: Self-pay | Admitting: Radiation Oncology

## 2016-01-29 ENCOUNTER — Ambulatory Visit: Payer: Commercial Managed Care - HMO

## 2016-01-29 ENCOUNTER — Ambulatory Visit
Admission: RE | Admit: 2016-01-29 | Discharge: 2016-01-29 | Disposition: A | Discharge status: Home / Self Care | Payer: Commercial Managed Care - HMO | Source: Ambulatory Visit | Attending: Radiation Oncology | Admitting: Radiation Oncology

## 2016-01-29 VITALS — BP 130/65 | HR 67 | Temp 98.0°F | Ht 66.5 in | Wt 177.1 lb

## 2016-01-29 DIAGNOSIS — C50512 Malignant neoplasm of lower-outer quadrant of left female breast: Secondary | ICD-10-CM

## 2016-01-29 DIAGNOSIS — Z170 Estrogen receptor positive status [ER+]: Secondary | ICD-10-CM | POA: Diagnosis not present

## 2016-01-29 DIAGNOSIS — Z510 Encounter for antineoplastic radiation therapy: Secondary | ICD-10-CM | POA: Diagnosis not present

## 2016-01-29 NOTE — Progress Notes (Signed)
Ms. Stuver is here for her 14th fraction of radiation to her Left Breast. She reports some fatigue. She spent the weekend at the beach, and was tired yesterday. Her breast is slightly red and hyperpigmented. She denies tenderness or itching.   BP 130/65 mmHg  Pulse 67  Temp(Src) 98 F (36.7 C)  Ht 5' 6.5" (1.689 m)  Wt 177 lb 1.6 oz (80.332 kg)  BMI 28.16 kg/m2

## 2016-01-29 NOTE — Progress Notes (Addendum)
   Weekly Management Note:  outpatient    ICD-9-CM ICD-10-CM   1. Breast cancer of lower-outer quadrant of left female breast (HCC) 174.5 C50.512     Current Dose:  37.4 Gy  Projected Dose: 50.05 Gy   Narrative:  The patient presents for routine under treatment assessment.  CBCT/MVCT images/Port film x-rays were reviewed.  The chart was checked. Doing well. No complaints.  Physical Findings:  height is 5' 6.5" (1.689 m) and weight is 177 lb 1.6 oz (80.332 kg). Her temperature is 98 F (36.7 C). Her blood pressure is 130/65 and her pulse is 67.   Wt Readings from Last 3 Encounters:  01/29/16 177 lb 1.6 oz (80.332 kg)  01/22/16 176 lb 9.6 oz (80.105 kg)  01/15/16 176 lb 6.4 oz (80.015 kg)   Modest hyperpigmentation over left breast; skin intact.  Impression:  The patient is tolerating radiotherapy.  Plan:  Continue radiotherapy as planned.    ________________________________   Eppie Gibson, M.D.

## 2016-01-30 ENCOUNTER — Ambulatory Visit: Payer: Commercial Managed Care - HMO

## 2016-01-30 ENCOUNTER — Ambulatory Visit
Admission: RE | Admit: 2016-01-30 | Discharge: 2016-01-30 | Disposition: A | Discharge status: Home / Self Care | Payer: Commercial Managed Care - HMO | Source: Ambulatory Visit | Attending: Radiation Oncology | Admitting: Radiation Oncology

## 2016-01-30 DIAGNOSIS — C50512 Malignant neoplasm of lower-outer quadrant of left female breast: Secondary | ICD-10-CM | POA: Diagnosis not present

## 2016-01-30 DIAGNOSIS — Z510 Encounter for antineoplastic radiation therapy: Secondary | ICD-10-CM | POA: Diagnosis not present

## 2016-01-30 DIAGNOSIS — Z170 Estrogen receptor positive status [ER+]: Secondary | ICD-10-CM | POA: Diagnosis not present

## 2016-01-31 ENCOUNTER — Ambulatory Visit: Payer: Commercial Managed Care - HMO

## 2016-01-31 ENCOUNTER — Ambulatory Visit
Admission: RE | Admit: 2016-01-31 | Discharge: 2016-01-31 | Disposition: A | Discharge status: Home / Self Care | Payer: Commercial Managed Care - HMO | Source: Ambulatory Visit | Attending: Radiation Oncology | Admitting: Radiation Oncology

## 2016-01-31 DIAGNOSIS — Z170 Estrogen receptor positive status [ER+]: Secondary | ICD-10-CM | POA: Diagnosis not present

## 2016-01-31 DIAGNOSIS — Z510 Encounter for antineoplastic radiation therapy: Secondary | ICD-10-CM | POA: Diagnosis not present

## 2016-01-31 DIAGNOSIS — C50512 Malignant neoplasm of lower-outer quadrant of left female breast: Secondary | ICD-10-CM | POA: Diagnosis not present

## 2016-02-01 ENCOUNTER — Ambulatory Visit: Payer: Commercial Managed Care - HMO

## 2016-02-01 ENCOUNTER — Ambulatory Visit
Admission: RE | Admit: 2016-02-01 | Discharge: 2016-02-01 | Disposition: A | Discharge status: Home / Self Care | Payer: Commercial Managed Care - HMO | Source: Ambulatory Visit | Attending: Radiation Oncology | Admitting: Radiation Oncology

## 2016-02-01 DIAGNOSIS — Z510 Encounter for antineoplastic radiation therapy: Secondary | ICD-10-CM | POA: Diagnosis not present

## 2016-02-01 DIAGNOSIS — Z170 Estrogen receptor positive status [ER+]: Secondary | ICD-10-CM | POA: Diagnosis not present

## 2016-02-01 DIAGNOSIS — C50512 Malignant neoplasm of lower-outer quadrant of left female breast: Secondary | ICD-10-CM | POA: Diagnosis not present

## 2016-02-02 ENCOUNTER — Ambulatory Visit: Payer: Commercial Managed Care - HMO

## 2016-02-02 ENCOUNTER — Ambulatory Visit
Admission: RE | Admit: 2016-02-02 | Discharge: 2016-02-02 | Disposition: A | Discharge status: Home / Self Care | Payer: Commercial Managed Care - HMO | Source: Ambulatory Visit | Attending: Radiation Oncology | Admitting: Radiation Oncology

## 2016-02-02 DIAGNOSIS — Z510 Encounter for antineoplastic radiation therapy: Secondary | ICD-10-CM | POA: Diagnosis not present

## 2016-02-02 DIAGNOSIS — C50512 Malignant neoplasm of lower-outer quadrant of left female breast: Secondary | ICD-10-CM | POA: Diagnosis not present

## 2016-02-02 DIAGNOSIS — Z170 Estrogen receptor positive status [ER+]: Secondary | ICD-10-CM | POA: Diagnosis not present

## 2016-02-05 ENCOUNTER — Encounter: Payer: Self-pay | Admitting: Radiation Oncology

## 2016-02-05 ENCOUNTER — Ambulatory Visit: Payer: Commercial Managed Care - HMO

## 2016-02-05 ENCOUNTER — Ambulatory Visit
Admission: RE | Admit: 2016-02-05 | Discharge: 2016-02-05 | Disposition: A | Discharge status: Home / Self Care | Payer: Commercial Managed Care - HMO | Source: Ambulatory Visit | Attending: Radiation Oncology | Admitting: Radiation Oncology

## 2016-02-05 VITALS — BP 141/75 | HR 70 | Temp 98.0°F | Ht 66.5 in | Wt 177.9 lb

## 2016-02-05 DIAGNOSIS — R21 Rash and other nonspecific skin eruption: Secondary | ICD-10-CM | POA: Insufficient documentation

## 2016-02-05 DIAGNOSIS — R5383 Other fatigue: Secondary | ICD-10-CM | POA: Insufficient documentation

## 2016-02-05 DIAGNOSIS — Z170 Estrogen receptor positive status [ER+]: Secondary | ICD-10-CM | POA: Diagnosis not present

## 2016-02-05 DIAGNOSIS — Z923 Personal history of irradiation: Secondary | ICD-10-CM | POA: Insufficient documentation

## 2016-02-05 DIAGNOSIS — L598 Other specified disorders of the skin and subcutaneous tissue related to radiation: Secondary | ICD-10-CM | POA: Insufficient documentation

## 2016-02-05 DIAGNOSIS — C50512 Malignant neoplasm of lower-outer quadrant of left female breast: Secondary | ICD-10-CM | POA: Diagnosis not present

## 2016-02-05 DIAGNOSIS — Z510 Encounter for antineoplastic radiation therapy: Secondary | ICD-10-CM | POA: Diagnosis not present

## 2016-02-05 MED ORDER — RADIAPLEXRX EX GEL
Freq: Once | CUTANEOUS | Status: AC
  Administered 2016-02-05: 09:00:00 via TOPICAL

## 2016-02-05 NOTE — Progress Notes (Signed)
Autumn Frazier is here for her 19th fraction of radiation to her Left Breast. She denies pain, except an occasional "twinge" in her Left Breast. She reports some fatigue over the weekend. Her breast is red, with some raised red bumps. She reports these areas do not itch at this time. She also has some hyperpigmentation to her Left Breast. She was given a follow up appointment for one month and a new tube of radiaplex cream to continue to use.  BP 141/75 mmHg  Pulse 70  Temp(Src) 98 F (36.7 C)  Ht 5' 6.5" (1.689 m)  Wt 177 lb 14.4 oz (80.695 kg)  BMI 28.29 kg/m2

## 2016-02-05 NOTE — Addendum Note (Signed)
Encounter addended by: Ernst Spell, RN on: 02/05/2016 10:47 AM<BR>     Documentation filed: Inpatient MAR

## 2016-02-05 NOTE — Progress Notes (Signed)
   Weekly Management Note:  outpatient    ICD-9-CM ICD-10-CM   1. Breast cancer of lower-outer quadrant of left female breast (HCC) 174.5 C50.512     Current Dose:  48.05 Gy  Projected Dose: 50.05 Gy   Narrative:  The patient presents for routine under treatment assessment.  CBCT/MVCT images/Port film x-rays were reviewed.  The chart was checked. Doing well.  Fatigued.  Rash in UIQ of left breast isn't itching.   Physical Findings:  height is 5' 6.5" (1.689 m) and weight is 177 lb 14.4 oz (80.695 kg). Her temperature is 98 F (36.7 C). Her blood pressure is 141/75 and her pulse is 70.   Wt Readings from Last 3 Encounters:  02/05/16 177 lb 14.4 oz (80.695 kg)  01/29/16 177 lb 1.6 oz (80.332 kg)  01/22/16 176 lb 9.6 oz (80.105 kg)   She has  hyperpigmentation over left breast; skin intact. Radiation dermatitis in UIQ left breast.  Impression:  The patient is tolerating radiotherapy.  Plan:  Continue radiotherapy as planned.  1 mo followup card given.  ________________________________   Eppie Gibson, M.D.

## 2016-02-06 ENCOUNTER — Encounter: Payer: Self-pay | Admitting: Radiation Oncology

## 2016-02-06 ENCOUNTER — Ambulatory Visit
Admission: RE | Admit: 2016-02-06 | Discharge: 2016-02-06 | Disposition: A | Discharge status: Home / Self Care | Payer: Commercial Managed Care - HMO | Source: Ambulatory Visit | Attending: Radiation Oncology | Admitting: Radiation Oncology

## 2016-02-06 DIAGNOSIS — Z510 Encounter for antineoplastic radiation therapy: Secondary | ICD-10-CM | POA: Diagnosis not present

## 2016-02-06 DIAGNOSIS — Z170 Estrogen receptor positive status [ER+]: Secondary | ICD-10-CM | POA: Diagnosis not present

## 2016-02-06 DIAGNOSIS — C50512 Malignant neoplasm of lower-outer quadrant of left female breast: Secondary | ICD-10-CM | POA: Diagnosis not present

## 2016-02-07 ENCOUNTER — Telehealth: Payer: Self-pay | Admitting: *Deleted

## 2016-02-07 NOTE — Telephone Encounter (Signed)
Spoke with patient to follow up after radiation completion.  She states she is doing well.  Encouraged her to call with any needs or concerns.

## 2016-02-08 DIAGNOSIS — E039 Hypothyroidism, unspecified: Secondary | ICD-10-CM | POA: Diagnosis not present

## 2016-02-09 ENCOUNTER — Encounter: Payer: Self-pay | Admitting: Radiation Oncology

## 2016-02-09 NOTE — Progress Notes (Signed)
[facility] End of Treatment Note  Name:Autumn Frazier  Date: 02/09/2016 [id number] DOB:05/11/1941     DIAGNOSIS: Stage II pT2N0M0 Left Breast LOQ Invasive Ductal Carcinoma with DCIS, ER+ / PR+ / Her2negative, Grade 1      INDICATION FOR TREATMENT: Curative   TREATMENT DATES: 01-09-16 to  02-06-16                            Site/dose:   1) Left Breast / 40.05 Gy in 15 fractions 2) Left Breast Boost / 10 Gy in 5 fractions  Beams/energy:  1) tangents, 3D conformal  / 10 and 6MV 2)  photons / 10 and 6MV  NARRATIVE: She tolerated treatment well with some fatigue and skin irritation.          PLAN: Routine followup in one month. Patient instructed to call if questions or worsening complaints in interim.   -----------------------------------  Eppie Gibson, MD

## 2016-02-20 ENCOUNTER — Other Ambulatory Visit: Payer: Self-pay | Admitting: Adult Health

## 2016-02-20 DIAGNOSIS — C50512 Malignant neoplasm of lower-outer quadrant of left female breast: Secondary | ICD-10-CM

## 2016-03-08 ENCOUNTER — Ambulatory Visit: Payer: Commercial Managed Care - HMO | Admitting: Radiation Oncology

## 2016-03-13 ENCOUNTER — Ambulatory Visit: Payer: Commercial Managed Care - HMO | Admitting: Oncology

## 2016-03-14 ENCOUNTER — Encounter: Payer: Self-pay | Admitting: Radiation Oncology

## 2016-03-15 NOTE — Progress Notes (Signed)
  Breast cancer of lower-outer quadrant of left female breast ([place]) 174.5 C50.512     Photon Boost Complex Simulation and Treatment Planning Note  Diagnosis: Breast Cancer  The patient's CT images from her free-breathing simulation were reviewed to plan her boost treatment to her left breast  lumpectomy cavity.  The boost to the lumpectomy cavity will be delivered with 2 photon fields using MLCs for custom blocks again heart and lungs, with 10 and 6 MV photon energy.  This constitutes 2 complex treatment devices. Isodose plan was reviewed and approved. 10 Gy in 5 fractions prescribed.  -----------------------------------  Eppie Gibson, MD

## 2016-03-18 ENCOUNTER — Ambulatory Visit (HOSPITAL_BASED_OUTPATIENT_CLINIC_OR_DEPARTMENT_OTHER): Payer: Commercial Managed Care - HMO | Admitting: Oncology

## 2016-03-18 ENCOUNTER — Telehealth: Payer: Self-pay | Admitting: Oncology

## 2016-03-18 VITALS — BP 134/81 | HR 76 | Temp 98.3°F | Resp 18 | Ht 66.5 in | Wt 175.6 lb

## 2016-03-18 DIAGNOSIS — C50512 Malignant neoplasm of lower-outer quadrant of left female breast: Secondary | ICD-10-CM | POA: Diagnosis not present

## 2016-03-18 DIAGNOSIS — M8580 Other specified disorders of bone density and structure, unspecified site: Secondary | ICD-10-CM

## 2016-03-18 DIAGNOSIS — M810 Age-related osteoporosis without current pathological fracture: Secondary | ICD-10-CM | POA: Insufficient documentation

## 2016-03-18 DIAGNOSIS — Z170 Estrogen receptor positive status [ER+]: Secondary | ICD-10-CM | POA: Diagnosis not present

## 2016-03-18 MED ORDER — ANASTROZOLE 1 MG PO TABS: 1.0000 mg | ORAL_TABLET | Freq: Every day | ORAL | Status: DC

## 2016-03-18 NOTE — Progress Notes (Signed)
[facility]  [phone] Fax:([phone]) [phone]     ID: [name] DOB: 1941-08-23  MR#: [id number]  VZS#:[id number]  Patient Care Team: Autumn Huddle, MD as PCP - General (Internal Medicine) Autumn Bookbinder, MD as Consulting Physician (General Surgery) Autumn Cruel, MD as Consulting Physician (Oncology) Autumn Gibson, MD as Attending Physician (Radiation Oncology) Autumn Cheese, NP as Nurse Practitioner (Hematology and Oncology) PCP: Autumn Screws, MD GYN: OTHER MD:  CHIEF COMPLAINT: Estrogen receptor positive breast cancer  CURRENT TREATMENT: to start anastrozole   BREAST CANCER HISTORY: From the original intake note:   "Autumn Frazier" had routine screening mammography with tomography of the [facility] 10/31/2015. A possible mass associated with calcifications was noted in the left breast. Accordingly the patient proceeded to digital diagnostic left mammography and ultrasonography the same day. Breast density was category B. In the left breast that was a small ill-defined mass at approximately 4:00 measuring 7 mm mammographically. Also there was a small group of heterogeneous calcifications in a linear configuration in the upper outer quadrant of the left breast, at the 12:30 o'clock position. The calcifications spanned a 0.4 cm. There were approximately 3 cm superior to the mass. There was a second group of heterogeneous calcifications approximately 1 cm posterior and lateral to the mass. These spanned 1.1 cm. The distance between both groups of calcifications (with a mass in between) was 4.5 cm.  I examined her was no palpable abnormality. By ultrasonography there was a small irregular hypoechoic mass at the 3:30 o'clock position of the left breast measuring 0.6 cm maximally. Ultrasound of the axilla was unremarkable.  On the same day the patient underwent biopsy of the left breast mass in question and also the calcifications at the 12:30 o'clock  position. The breast mass ([id number]) proved to be an invasive ductal carcinoma, grade 1, estrogen receptor 100% positive, progesterone receptor 30% positive, both with strong staining intensity, with an MIB-1 of 10%, and no HER-2/neu amplification, the signals ratio being 1.50 and the number Purcell 2.25.  The area of calcifications at 12:30 o'clock showed usual ductal hyperplasia without atypia.  On 11/06/2015 Autumn Frazier underwent biopsy of a second area of calcifications less than 2 cm away from the primary mass and this showed (S [id number]) atypical ductal hyperplasia.  Subsequent history is as detailed below.  INTERVAL HISTORY: Autumn Frazier of her estrogen receptor positive breast cancer. Since her last visit here she completed her radiation Frazier. She did terrific with these. She was not at all fatigue until the last week, and then only for about 2 days. She "never missed a beat". She did have erythema, but no desquamation.  REVIEW OF SYSTEMS: Autumn Frazier has some stress urinary incontinence issues which are chronic. She has no pain or soreness or sensitivity in the surgical breast a few a guided A detailed review of systems today was otherwise noncontributory  PAST MEDICAL HISTORY: Past Medical History  Diagnosis Date  . Breast cancer of lower-outer quadrant of left female breast ([place]) 11/02/2015  . Breast cancer ([place])   . Hx of radiation therapy 01/09/16- 02/06/16    Left Breast    PAST SURGICAL HISTORY: Past Surgical History  Procedure Laterality Date  . Tonsillectomy    . Herniated disc    . Radioactive seed guided mastectomy with axillary sentinel lymph node biopsy Left 11/27/2015    Procedure: LEFT BREAST SEED AND WIRE GUIDED LUMPECTOMY WITH LEFT AXILLARY LYMPH NODE BIOPSY;  Surgeon: Autumn Bookbinder,  MD;  Location: [facility];  Service: General;  Laterality: Left;    FAMILY HISTORY Family History  Problem Relation Age of Onset  . Brain cancer  Sister   . Ovarian cancer Maternal Aunt    the patient's father died from heart disease at the age of 40. The patient's mother died from heart failure at the age of 63. The patient had one brother who was shot in an accident at the age of 67. She has 1 sister. On the mother's side and aunt at age 97 was diagnosed with ovarian cancer. The patient's own sister was diagnosed with a brain tumor at the age of 86 and died 71 years later.  GYNECOLOGIC HISTORY:  No LMP recorded. Patient is postmenopausal. Menarche age 53, first live birth age 61, the patient is Viola P3. She stopped having periods in 1997. She never used hormone replacement. She never used oral contraceptives.  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 Her husband Autumn Frazier worked in [employer] fund[employer] for many years and then started the [employer] here in town. He is now retired. Daughter Autumn Frazier is a homemaker in [place]. Son Autumn Frazier the third lives in [place] were he works in [place] for [employer]. Daughter Autumn Frazier lives in a plan of where she works as an Immunologist. The patient has 10 grandchildren (7 blood and 3 step). She attends first [place]: [place] place   HEALTH MAINTENANCE: Social History  Substance Use Topics  . Smoking status: Former Research scientist (life sciences)  . Smokeless tobacco: Not on file  . Alcohol Use: 1.8 oz/week    3 Glasses of wine per week     Colonoscopy: 2011?  PAP:  Bone density: remote  Lipid panel:  No Known Allergies  Current Outpatient Prescriptions  Medication Sig Dispense Refill  . aspirin 81 MG tablet Take 81 mg by mouth daily.    Marland Kitchen b complex vitamins tablet Take 1 tablet by mouth daily.    . Biotin 5000 MCG TABS Take by mouth.    . Fluticasone-Salmeterol (ADVAIR) 100-50 MCG/DOSE AEPB Inhale 1 puff into the lungs every 12 (twelve) hours. 60 each 1  . levothyroxine (SYNTHROID, LEVOTHROID) 137 MCG tablet TK 1 T PO QD.  DOSE CHANGE  3  . levothyroxine (SYNTHROID, LEVOTHROID) 150 MCG tablet 150 mcg. Reported on 01/22/2016  3  . Vitamin D, Ergocalciferol, (DRISDOL) 50000 units CAPS capsule Take 50,000 Units by mouth every 7 (seven) days.     No current facility-administered medications for this visit.    OBJECTIVE: Middle-aged white woman Who appears well Filed Vitals:   03/18/16 1040  BP: 134/81  Pulse: 76  Temp: 98.3 F (36.8 C)  Resp: 18     Body mass index is 27.92 kg/(m^2).    ECOG FS:0 - Asymptomatic  Sclerae unicteric, EOMs intact Oropharynx clear and moist No cervical or supraclavicular adenopathy Lungs no rales or rhonchi Heart regular rate and rhythm Abd soft, nontender, positive bowel sounds MSK no focal spinal tenderness, no upper extremity lymphedema Neuro: nonfocal, well oriented, appropriate affect Breasts: Right breast is unremarkable. The left breast is status post lumpectomy followed by radiation. There is no erythema or hyperpigmentation. The cosmetic result is excellent. The left axilla is benign.   LAB RESULTS:  CMP     Component Value Date/Time   NA 142 11/08/2015 1224   NA 140 02/23/2012 0930   K 4.0 11/08/2015  1224   K 4.3 02/23/2012 0930   CL 106 02/23/2012 0930   CO2 27 11/08/2015 1224   CO2 20 02/23/2012 0930   GLUCOSE 83 11/08/2015 1224   GLUCOSE 101* 02/23/2012 0930   BUN 16.8 11/08/2015 1224   BUN 17 02/23/2012 0930   CREATININE 0.8 11/08/2015 1224   CREATININE 0.73 02/23/2012 0930   CALCIUM 9.3 11/08/2015 1224   CALCIUM 9.5 02/23/2012 0930   PROT 6.5 11/08/2015 1224   PROT 6.2 04/26/2008 0220   ALBUMIN 3.5 11/08/2015 1224   ALBUMIN 3.7 04/26/2008 0220   AST 14 11/08/2015 1224   AST 28 04/26/2008 0220   ALT 17 11/08/2015 1224   ALT 16 04/26/2008 0220   ALKPHOS 96 11/08/2015 1224   ALKPHOS 73 04/26/2008 0220   BILITOT 0.33 11/08/2015 1224   BILITOT 1.0 04/26/2008 0220   GFRNONAA 83* 02/23/2012 0930   GFRAA >90 02/23/2012 0930    INo results  found for: SPEP, UPEP  Lab Results  Component Value Date   WBC 7.5 11/08/2015   NEUTROABS 4.6 11/08/2015   HGB 13.2 11/08/2015   HCT 41.6 11/08/2015   MCV 90.2 11/08/2015   PLT 232 11/08/2015      Chemistry      Component Value Date/Time   NA 142 11/08/2015 1224   NA 140 02/23/2012 0930   K 4.0 11/08/2015 1224   K 4.3 02/23/2012 0930   CL 106 02/23/2012 0930   CO2 27 11/08/2015 1224   CO2 20 02/23/2012 0930   BUN 16.8 11/08/2015 1224   BUN 17 02/23/2012 0930   CREATININE 0.8 11/08/2015 1224   CREATININE 0.73 02/23/2012 0930      Component Value Date/Time   CALCIUM 9.3 11/08/2015 1224   CALCIUM 9.5 02/23/2012 0930   ALKPHOS 96 11/08/2015 1224   ALKPHOS 73 04/26/2008 0220   AST 14 11/08/2015 1224   AST 28 04/26/2008 0220   ALT 17 11/08/2015 1224   ALT 16 04/26/2008 0220   BILITOT 0.33 11/08/2015 1224   BILITOT 1.0 04/26/2008 0220       No results found for: LABCA2  No components found for: HALPF790  No results for input(s): INR in the last 168 hours.  Urinalysis    Component Value Date/Time   COLORURINE RED BIOCHEMICALS MAY BE AFFECTED BY COLOR* 04/26/2008 0455   APPEARANCEUR CLOUDY* 04/26/2008 0455   LABSPEC 1.021 04/26/2008 0455   PHURINE 7.0 04/26/2008 0455   GLUCOSEU NEGATIVE 04/26/2008 0455   HGBUR LARGE* 04/26/2008 0455   BILIRUBINUR NEGATIVE 04/26/2008 0455   KETONESUR 15* 04/26/2008 0455   PROTEINUR 30* 04/26/2008 0455   UROBILINOGEN 1.0 04/26/2008 0455   NITRITE NEGATIVE 04/26/2008 0455   LEUKOCYTESUR MODERATE* 04/26/2008 0455      ELIGIBLE FOR AVAILABLE RESEARCH PROTOCOL: PALLAS  STUDIES: No results found.  ASSESSMENT: 75 y.o. [place] woman status post left breast lower outer quadrant biopsy 10/31/2015 for a clinical T1b N0, stage IA  invasive ductal carcinoma, grade 1, estrogen and progesterone receptor positive, HER-2 nonamplified, with an MIB-1 of 10%  (a) biopsy of an area of calcifications 10/31/2015 showed usual ductal  hyperplasia  (b) biopsy of a second area of calcifications 1.8 cm from the breast mass 11/06/2015 showed atypical ductal hyperplasia  (1) status post left lumpectomy 11/27/2015 for a pT2 cN0, stage IIA invasive ductal carcinoma, grade 1, repeat HER-2 again negative.  (2) adjuvant radiation 01-09-16 to 02-06-16: Left Breast / 40.05 Gy in 15 fractions, Left Breast Boost / 10 Gy in  5 fractions  (3) anastrozole started 03/18/2016  (a) bone density 12/04/2015 shows a T score of -1.9  (4) the patient opted against genetic testing  PLAN: Autumn Frazier and she is now ready to consider anti-estrogens.  We discussed the difference between tamoxifen and anastrozole in detail. She understands that anastrozole and the aromatase inhibitors in general work by blocking estrogen production. Accordingly vaginal dryness, decrease in bone density, and of course hot flashes can result. The aromatase inhibitors can also negatively affect the cholesterol profile, although that is a minor effect. One out of 5 women on aromatase inhibitors we will feel "old and achy". This arthralgia/myalgia syndrome, which resembles fibromyalgia clinically, does resolve with stopping the medications. Accordingly this is not a reason to not try an aromatase inhibitor but it is a frequent reason to stop it (in other words 20% of women will not be able to tolerate these medications).  Tamoxifen on the other hand does not block estrogen production. It does not "take away a woman's estrogen". It blocks the estrogen receptor in breast cells. Like anastrozole, it can also cause hot flashes. As opposed to anastrozole, tamoxifen has many estrogen-like effects. It is technically an estrogen receptor modulator. This means that in some tissues tamoxifen works like estrogen-- for example it helps strengthen the bones. It tends to improve the cholesterol profile. It can cause thickening of the endometrial lining,  and even endometrial polyps or rarely cancer of the uterus.(The risk of uterine cancer due to tamoxifen is one additional cancer per thousand women year). It can cause vaginal wetness or stickiness. It can cause blood clots through this estrogen-like effect--the risk of blood clots with tamoxifen is exactly the same as with birth control pills or hormone replacement.  Neither of these agents causes mood changes or weight gain, despite the popular belief that they can have these side effects. We have data from studies comparing either of these drugs with placebo, and in those cases the control group had the same amount of weight gain and depression as the group that took the drug.  After much discussion she decided we would start with anastrozole we note that she already has osteopenia. I think she would be a good candidate for denosumab/Prolia. She is going to discuss that with her dentist tomorrow. Today we did discuss the possible toxicities, side effects and complications of that agent including the rare cases of osteonecrosis of the jaw.  She is going to see me again the first week in October. If she tolerates anastrozole well, the plan will be to continue that for 5 years. Otherwise we will switch to tamoxifen. We will likely start the Prolia at the next visit here.  She knows to call for any problems that may develop before her next visit.   Autumn Cruel, MD   03/18/2016 10:56 AM Medical Oncology and Hematology [facility] [address] [place], [place] [address] Tel. [phone]    Fax. [phone]

## 2016-03-18 NOTE — Telephone Encounter (Signed)
appt made and avs printed °

## 2016-03-20 ENCOUNTER — Ambulatory Visit
Admission: RE | Admit: 2016-03-20 | Discharge: 2016-03-20 | Disposition: A | Discharge status: Home / Self Care | Payer: Commercial Managed Care - HMO | Source: Ambulatory Visit | Attending: Radiation Oncology | Admitting: Radiation Oncology

## 2016-03-20 ENCOUNTER — Encounter: Payer: Self-pay | Admitting: Radiation Oncology

## 2016-03-20 VITALS — BP 137/66 | HR 98 | Temp 97.9°F | Ht 66.5 in | Wt 177.8 lb

## 2016-03-20 DIAGNOSIS — Z170 Estrogen receptor positive status [ER+]: Secondary | ICD-10-CM | POA: Diagnosis not present

## 2016-03-20 DIAGNOSIS — C50512 Malignant neoplasm of lower-outer quadrant of left female breast: Secondary | ICD-10-CM

## 2016-03-20 HISTORY — DX: Personal history of irradiation: Z92.3

## 2016-03-20 NOTE — Progress Notes (Signed)
Radiation Oncology         [phone] ________________________________  Name: Autumn Frazier MRN: [id number]  Date: 03/20/2016  DOB: 01/07/1941  Follow-Up Visit Note  Outpatient  CC: Henrine Screws, MD  Rolm Bookbinder, MD      ICD-9-CM ICD-10-CM   1. Breast cancer of lower-outer quadrant of left female breast ([place]) 174.5 C50.512     Diagnosis: Stage II pT2N0M0 Left Breast LOQ Invasive Ductal Carcinoma with DCIS, ER+ / PR+ / Her2negative, Grade 1     Indication for Treatment: Curative Radiation Treatment Dates: 01-09-16 to  02-06-16                       Site/dose:   1) Left Breast / 40.05 Gy in 15 fractions 2) Left Breast Boost / 10 Gy in 5 fractions   Narrative: Autumn Frazier presents for follow up of radiation completed 02/06/16 to her Left Breast. She denies pain or fatigue.   She is still using the Radiaplex cream and will switch to a Vitamin E cream when finished. She picked up her prescription for her Anastrozole today, and will begin taking it tomorrow. She has been made aware of her Survivorship appointment for 04/25/16.                              ALLERGIES:  has No Known Allergies.  Meds: Current Outpatient Prescriptions  Medication Sig Dispense Refill  . anastrozole (ARIMIDEX) 1 MG tablet Take 1 tablet (1 mg total) by mouth daily. 90 tablet 4  . aspirin 81 MG tablet Take 81 mg by mouth daily.    Marland Kitchen b complex vitamins tablet Take 1 tablet by mouth daily.    . Biotin 5000 MCG TABS Take by mouth.    . Fluticasone-Salmeterol (ADVAIR) 100-50 MCG/DOSE AEPB Inhale 1 puff into the lungs every 12 (twelve) hours. 60 each 1  . levothyroxine (SYNTHROID, LEVOTHROID) 137 MCG tablet Patient is taking 125mcg daily  3  . Vitamin D, Ergocalciferol, (DRISDOL) 50000 units CAPS capsule Take 50,000 Units by mouth every 7 (seven) days.     No current facility-administered medications for this encounter.    Physical Findings: The patient is in no acute distress. Patient is alert and  oriented.  height is 5' 6.5" (1.689 m) and weight is 177 lb 12.8 oz (80.65 kg). Her temperature is 97.9 F (36.6 C). Her blood pressure is 137/66 and her pulse is 98. .     On breast exam, the skin over the left breast has healed well with very little pigment changes remaining.    Lab Findings: Lab Results  Component Value Date   WBC 7.5 11/08/2015   HGB 13.2 11/08/2015   HCT 41.6 11/08/2015   MCV 90.2 11/08/2015   PLT 232 11/08/2015    Radiographic Findings: No results found.  Impression/Plan: She is healing from radiotherapy. Apply vit E lotion daily to left breast for further healing. I encouraged her to continue with yearly mammography and followup with medical oncology. I will see her back on an as-needed basis. I have encouraged her to call if she has any issues or concerns in the future. I wished her the very best.   _____________________________________   Eppie Gibson, MD  This document serves as a record of services personally performed by Eppie Gibson, MD. It was created on her behalf by Derek Mound, a trained medical scribe. The creation of this  record is based on the scribe's personal observations and the provider's statements to them. This document has been checked and approved by the attending provider.

## 2016-03-20 NOTE — Addendum Note (Signed)
Encounter addended by: Ernst Spell, RN on: 03/20/2016  1:33 PM<BR>     Documentation filed: Charges VN

## 2016-03-20 NOTE — Progress Notes (Signed)
Autumn Frazier presents for follow up of radiation completed 02/06/16 to her Left Breast. She denies pain or fatigue. The skin to her Left Breast is normal appearing. She is still using the Radiaplex cream and will switch to a Vitamin E cream when finished. She picked up her prescription for her Anastrozole today, and will begin taking it tomorrow. She has been made aware of her Survivorship appointment for 04/25/16.  BP 137/66 mmHg  Pulse 98  Temp(Src) 97.9 F (36.6 C)  Ht 5' 6.5" (1.689 m)  Wt 177 lb 12.8 oz (80.65 kg)  BMI 28.27 kg/m2   Wt Readings from Last 3 Encounters:  03/20/16 177 lb 12.8 oz (80.65 kg)  03/18/16 175 lb 9.6 oz (79.652 kg)  02/05/16 177 lb 14.4 oz (80.695 kg)

## 2016-03-22 DIAGNOSIS — L821 Other seborrheic keratosis: Secondary | ICD-10-CM | POA: Diagnosis not present

## 2016-03-22 DIAGNOSIS — L812 Freckles: Secondary | ICD-10-CM | POA: Diagnosis not present

## 2016-04-02 ENCOUNTER — Encounter: Payer: Self-pay | Admitting: Medical Oncology

## 2016-04-11 DIAGNOSIS — E039 Hypothyroidism, unspecified: Secondary | ICD-10-CM | POA: Diagnosis not present

## 2016-04-25 ENCOUNTER — Encounter: Payer: Commercial Managed Care - HMO | Admitting: Adult Health

## 2016-06-24 ENCOUNTER — Other Ambulatory Visit: Payer: Commercial Managed Care - HMO

## 2016-06-24 ENCOUNTER — Other Ambulatory Visit: Payer: Self-pay | Admitting: *Deleted

## 2016-06-24 ENCOUNTER — Ambulatory Visit: Payer: Commercial Managed Care - HMO

## 2016-06-24 ENCOUNTER — Ambulatory Visit (HOSPITAL_BASED_OUTPATIENT_CLINIC_OR_DEPARTMENT_OTHER): Payer: Commercial Managed Care - HMO | Admitting: Oncology

## 2016-06-24 VITALS — BP 146/73 | HR 82 | Temp 97.7°F | Resp 18 | Ht 66.5 in | Wt 179.4 lb

## 2016-06-24 DIAGNOSIS — Z170 Estrogen receptor positive status [ER+]: Secondary | ICD-10-CM | POA: Diagnosis not present

## 2016-06-24 DIAGNOSIS — Z79811 Long term (current) use of aromatase inhibitors: Secondary | ICD-10-CM

## 2016-06-24 DIAGNOSIS — C50512 Malignant neoplasm of lower-outer quadrant of left female breast: Secondary | ICD-10-CM

## 2016-06-24 NOTE — Progress Notes (Signed)
[facility]  [phone] [phone]     ID: LITHA LAMARTINA DOB: [birth year]  MR#: [id number]  GLO#:[id number]  Patient Care Team: Josetta Huddle, MD as PCP - General (Internal Medicine) Rolm Bookbinder, MD as Consulting Physician (General Surgery) Chauncey Cruel, MD as Consulting Physician (Oncology) Eppie Gibson, MD as Attending Physician (Radiation Oncology) Sylvan Cheese, NP as Nurse Practitioner (Hematology and Oncology) PCP: Henrine Screws, MD GYN: OTHER MD:  CHIEF COMPLAINT: Estrogen receptor positive breast cancer  CURRENT TREATMENT: anastrozole   BREAST CANCER HISTORY: From the original intake note:   "Autumn Frazier" had routine screening mammography with tomography of the Breast Center 10/31/2015. A possible mass associated with calcifications was noted in the left breast. Accordingly the patient proceeded to digital diagnostic left mammography and ultrasonography the same day. Breast density was category B. In the left breast that was a small ill-defined mass at approximately 4:00 measuring 7 mm mammographically. Also there was a small group of heterogeneous calcifications in a linear configuration in the upper outer quadrant of the left breast, at the 12:30 o'clock position. The calcifications spanned a 0.4 cm. There were approximately 3 cm superior to the mass. There was a second group of heterogeneous calcifications approximately 1 cm posterior and lateral to the mass. These spanned 1.1 cm. The distance between both groups of calcifications (with a mass in between) was 4.5 cm.  I examined her was no palpable abnormality. By ultrasonography there was a small irregular hypoechoic mass at the 3:30 o'clock position of the left breast measuring 0.6 cm maximally. Ultrasound of the axilla was unremarkable.  On the same day the patient underwent biopsy of the left breast mass in question and also the calcifications at the 12:30 o'clock position.  The breast mass ([id number]) proved to be an invasive ductal carcinoma, grade 1, estrogen receptor 100% positive, progesterone receptor 30% positive, both with strong staining intensity, with an MIB-1 of 10%, and no HER-2/neu amplification, the signals ratio being 1.50 and the number Purcell 2.25.  The area of calcifications at 12:30 o'clock showed usual ductal hyperplasia without atypia.  On 11/06/2015 Autumn Frazier underwent biopsy of a second area of calcifications less than 2 cm away from the primary mass and this showed (S [id number]) atypical ductal hyperplasia.  Subsequent history is as detailed below.  INTERVAL HISTORY: Autumn Frazier returns today for follow-up of her breast cancer. She has been on anastrozole now approximately 3 months. She is doing remarkably well with this. Specifically she has not had hot flashes or vaginal dryness problems. She obtains a drug at a good price.  REVIEW OF SYSTEMS: Autumn Frazier exercises by walking about 3 times a week, about 30 minutes at a time. She is otherwise very active. A detailed review of systems today was benign  PAST MEDICAL HISTORY: Past Medical History:  Diagnosis Date  . Breast cancer ([place])   . Breast cancer of lower-outer quadrant of left female breast ([place]) 11/02/2015  . Hx of radiation therapy 01/09/16- 02/06/16   Left Breast    PAST SURGICAL HISTORY: Past Surgical History:  Procedure Laterality Date  . herniated disc    . RADIOACTIVE SEED GUIDED MASTECTOMY WITH AXILLARY SENTINEL LYMPH NODE BIOPSY Left 11/27/2015   Procedure: LEFT BREAST SEED AND WIRE GUIDED LUMPECTOMY WITH LEFT AXILLARY LYMPH NODE BIOPSY;  Surgeon: Rolm Bookbinder, MD;  Location: [facility];  Service: General;  Laterality: Left;  . TONSILLECTOMY      FAMILY HISTORY Family History  Problem Relation  Age of Onset  . Brain cancer Sister   . Ovarian cancer Maternal Aunt    the patient's father died from heart disease at the age of 17. The patient's mother died from heart  failure at the age of 9. The patient had one brother who was shot in an accident at the age of 49. She has 1 sister. On the mother's side and aunt at age 72 was diagnosed with ovarian cancer. The patient's own sister was diagnosed with a brain tumor at the age of 30 and died 40 years later.  GYNECOLOGIC HISTORY:  No LMP recorded. Patient is postmenopausal. Menarche age 75, first live birth age 26, the patient is [place] P3. She stopped having periods in 1997. She never used hormone replacement. She never used oral contraceptives.  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 Her husband Silvestre Moment worked in [employer] fund[employer] for many years and then started the [employer] here in town. He is now retired. Daughter AMANDINE COVINO is a homemaker in [place]. Son GUSTAVO MEDITZ the third lives in [place] were he works in [place] for [employer]. Daughter Blane Ohara lives in a plan of where she works as an Immunologist. The patient has 10 grandchildren (7 blood and 3 step). She attends [employer]   HEALTH MAINTENANCE: Social History  Substance Use Topics  . Smoking status: Former Research scientist (life sciences)  . Smokeless tobacco: Not on file  . Alcohol use 1.8 oz/week    3 Glasses of wine per week     Colonoscopy: 2011?  PAP:  Bone density: remote  Lipid panel:  No Known Allergies  Current Outpatient Prescriptions  Medication Sig Dispense Refill  . anastrozole (ARIMIDEX) 1 MG tablet Take 1 tablet (1 mg total) by mouth daily. 90 tablet 4  . aspirin 81 MG tablet Take 81 mg by mouth daily.    Marland Kitchen b complex vitamins tablet Take 1 tablet by mouth daily.    . Biotin 5000 MCG TABS Take by mouth.    . Fluticasone-Salmeterol (ADVAIR) 100-50 MCG/DOSE AEPB Inhale 1 puff into the lungs every 12 (twelve) hours. 60 each 1  . levothyroxine (SYNTHROID, LEVOTHROID) 137 MCG tablet Patient is taking 166mg daily  3  . Vitamin D, Ergocalciferol,  (DRISDOL) 50000 units CAPS capsule Take 50,000 Units by mouth every 7 (seven) days.     No current facility-administered medications for this visit.     Objective: Middle-aged white woman in no acute distress nBP: (!) 146/73  Pulse: 82  Resp: 18  Temp: 97.7 F (36.5 C)     Body mass index is 28.52 kg/m.    ECOG FS:0 - Asymptomatic  Sclerae unicteric, pupils round and equal Oropharynx clear and moist-- no thrush or other lesions No cervical or supraclavicular adenopathy Lungs no rales or rhonchi Heart regular rate and rhythm Abd soft, nontender, positive bowel sounds MSK no focal spinal tenderness, no upper extremity lymphedema Neuro: nonfocal, well oriented, appropriate affect Breasts: There are no suspicious findings in the right breast. The left breast is status post lumpectomy and radiation. There is no evidence of local recurrence. Both axillae are benign   LAB RESULTS:  CMP     Component Value Date/Time   NA 142 11/08/2015 1224   K 4.0 11/08/2015 1224   CL 106 02/23/2012 0930   CO2 27 11/08/2015 1224   GLUCOSE 83 11/08/2015 1224   BUN 16.8 11/08/2015 1224  CREATININE 0.8 11/08/2015 1224   CALCIUM 9.3 11/08/2015 1224   PROT 6.5 11/08/2015 1224   ALBUMIN 3.5 11/08/2015 1224   AST 14 11/08/2015 1224   ALT 17 11/08/2015 1224   ALKPHOS 96 11/08/2015 1224   BILITOT 0.33 11/08/2015 1224   GFRNONAA 83 (L) 02/23/2012 0930   GFRAA >90 02/23/2012 0930    INo results found for: SPEP, UPEP  Lab Results  Component Value Date   WBC 7.5 11/08/2015   NEUTROABS 4.6 11/08/2015   HGB 13.2 11/08/2015   HCT 41.6 11/08/2015   MCV 90.2 11/08/2015   PLT 232 11/08/2015      Chemistry      Component Value Date/Time   NA 142 11/08/2015 1224   K 4.0 11/08/2015 1224   CL 106 02/23/2012 0930   CO2 27 11/08/2015 1224   BUN 16.8 11/08/2015 1224   CREATININE 0.8 11/08/2015 1224      Component Value Date/Time   CALCIUM 9.3 11/08/2015 1224   ALKPHOS 96 11/08/2015 1224   AST  14 11/08/2015 1224   ALT 17 11/08/2015 1224   BILITOT 0.33 11/08/2015 1224       No results found for: LABCA2  No components found for: LABCA125  No results for input(s): INR in the last 168 hours.  Urinalysis    Component Value Date/Time   COLORURINE RED BIOCHEMICALS MAY BE AFFECTED BY COLOR (A) 04/26/2008 0455   APPEARANCEUR CLOUDY (A) 04/26/2008 0455   LABSPEC 1.021 04/26/2008 0455   PHURINE 7.0 04/26/2008 0455   GLUCOSEU NEGATIVE 04/26/2008 0455   HGBUR LARGE (A) 04/26/2008 0455   BILIRUBINUR NEGATIVE 04/26/2008 0455   KETONESUR 15 (A) 04/26/2008 0455   PROTEINUR 30 (A) 04/26/2008 0455   UROBILINOGEN 1.0 04/26/2008 0455   NITRITE NEGATIVE 04/26/2008 0455   LEUKOCYTESUR MODERATE (A) 04/26/2008 0455    STUDIES: No results found.  ASSESSMENT: 75 y.o. [place] woman status post left breast lower outer quadrant biopsy 10/31/2015 for a clinical T1b N0, stage IA  invasive ductal carcinoma, grade 1, estrogen and progesterone receptor positive, HER-2 nonamplified, with an MIB-1 of 10%  (a) biopsy of an area of calcifications 10/31/2015 showed usual ductal hyperplasia  (b) biopsy of a second area of calcifications 1.8 cm from the breast mass 11/06/2015 showed atypical ductal hyperplasia  (1) status post left lumpectomy 11/27/2015 for a pT2 cN0, stage IIA invasive ductal carcinoma, grade 1, repeat HER-2 again negative.  (2) adjuvant radiation 01-09-16 to 02-06-16: Left Breast / 40.05 Gy in 15 fractions, Left Breast Boost / 10 Gy in 5 fractions  (3) anastrozole started 03/18/2016  (a) bone density 12/04/2015 shows a T score of -1.9  (4) the patient opted against genetic testing  PLAN: Autumn Frazier is tolerating the anastrozole remarkably well. The plan will be to continue that at least for 2 years at which point we will consider switching to tamoxifen.  She is "scared" of the possibility of osteonecrosis of the jaw from bisphosphonates or denosumab. She would prefer to not receive  those medications if at all possible.  Accordingly we talked about optimizing her osteoporosis prevention program. She is currently walking 30 minutes 3 times a week. I gave her information on the trails to recovery program. She is already on good vitamin D and calcium supplementation.  We will repeat a bone density in March 2019 before deciding whether or not to switch to tamoxifen around that time  Otherwise she will see me again in 6 months. She knows to call for  any problems that may develop before that visit.  Chauncey Cruel, MD   06/24/2016 11:35 AM Medical Oncology and Hematology [facility] [address] [place],  [address] Tel. [phone]    Fax. [phone]

## 2016-09-18 ENCOUNTER — Other Ambulatory Visit: Payer: Self-pay | Admitting: Radiation Oncology

## 2016-09-18 DIAGNOSIS — Z853 Personal history of malignant neoplasm of breast: Secondary | ICD-10-CM

## 2016-10-29 DIAGNOSIS — I10 Essential (primary) hypertension: Secondary | ICD-10-CM | POA: Diagnosis not present

## 2016-10-29 DIAGNOSIS — Z23 Encounter for immunization: Secondary | ICD-10-CM | POA: Diagnosis not present

## 2016-10-29 DIAGNOSIS — K219 Gastro-esophageal reflux disease without esophagitis: Secondary | ICD-10-CM | POA: Diagnosis not present

## 2016-10-29 DIAGNOSIS — E039 Hypothyroidism, unspecified: Secondary | ICD-10-CM | POA: Diagnosis not present

## 2016-10-29 DIAGNOSIS — J309 Allergic rhinitis, unspecified: Secondary | ICD-10-CM | POA: Diagnosis not present

## 2016-10-29 DIAGNOSIS — Z79899 Other long term (current) drug therapy: Secondary | ICD-10-CM | POA: Diagnosis not present

## 2016-10-29 DIAGNOSIS — E559 Vitamin D deficiency, unspecified: Secondary | ICD-10-CM | POA: Diagnosis not present

## 2016-10-29 DIAGNOSIS — Z1389 Encounter for screening for other disorder: Secondary | ICD-10-CM | POA: Diagnosis not present

## 2016-10-29 DIAGNOSIS — Z0001 Encounter for general adult medical examination with abnormal findings: Secondary | ICD-10-CM | POA: Diagnosis not present

## 2016-10-29 DIAGNOSIS — J4520 Mild intermittent asthma, uncomplicated: Secondary | ICD-10-CM | POA: Diagnosis not present

## 2016-11-04 ENCOUNTER — Ambulatory Visit
Admission: RE | Admit: 2016-11-04 | Discharge: 2016-11-04 | Disposition: A | Discharge status: Home / Self Care | Payer: Commercial Managed Care - HMO | Source: Ambulatory Visit | Attending: Radiation Oncology | Admitting: Radiation Oncology

## 2016-11-04 DIAGNOSIS — Z853 Personal history of malignant neoplasm of breast: Secondary | ICD-10-CM

## 2016-11-04 DIAGNOSIS — R928 Other abnormal and inconclusive findings on diagnostic imaging of breast: Secondary | ICD-10-CM | POA: Diagnosis not present

## 2016-11-29 DIAGNOSIS — H52202 Unspecified astigmatism, left eye: Secondary | ICD-10-CM | POA: Diagnosis not present

## 2016-11-29 DIAGNOSIS — H2522 Age-related cataract, morgagnian type, left eye: Secondary | ICD-10-CM | POA: Diagnosis not present

## 2016-11-29 DIAGNOSIS — H4720 Unspecified optic atrophy: Secondary | ICD-10-CM | POA: Diagnosis not present

## 2016-11-29 DIAGNOSIS — H47323 Drusen of optic disc, bilateral: Secondary | ICD-10-CM | POA: Diagnosis not present

## 2016-12-18 ENCOUNTER — Telehealth: Payer: Self-pay | Admitting: Oncology

## 2016-12-18 NOTE — Telephone Encounter (Signed)
Patient called to reschedule appointments per expectected to have a surgery that week.

## 2016-12-23 ENCOUNTER — Ambulatory Visit: Payer: Commercial Managed Care - HMO | Admitting: Oncology

## 2016-12-23 ENCOUNTER — Other Ambulatory Visit: Payer: Commercial Managed Care - HMO

## 2016-12-24 DIAGNOSIS — H268 Other specified cataract: Secondary | ICD-10-CM | POA: Diagnosis not present

## 2016-12-24 DIAGNOSIS — H52202 Unspecified astigmatism, left eye: Secondary | ICD-10-CM | POA: Diagnosis not present

## 2016-12-24 DIAGNOSIS — H25812 Combined forms of age-related cataract, left eye: Secondary | ICD-10-CM | POA: Diagnosis not present

## 2016-12-27 DIAGNOSIS — E038 Other specified hypothyroidism: Secondary | ICD-10-CM | POA: Diagnosis not present

## 2017-01-06 ENCOUNTER — Other Ambulatory Visit: Payer: Self-pay | Admitting: Oncology

## 2017-01-06 ENCOUNTER — Telehealth: Payer: Self-pay | Admitting: Oncology

## 2017-01-06 DIAGNOSIS — C50512 Malignant neoplasm of lower-outer quadrant of left female breast: Secondary | ICD-10-CM | POA: Diagnosis not present

## 2017-01-06 NOTE — Telephone Encounter (Signed)
Pt husband to message in re pt r/s May appt to 10/8 at 11 am per GM 4/16 LOS

## 2017-01-14 ENCOUNTER — Emergency Department (HOSPITAL_COMMUNITY)
Admission: EM | Admit: 2017-01-14 | Discharge: 2017-01-14 | Disposition: A | Discharge status: Home / Self Care | Payer: Medicare HMO | Attending: Emergency Medicine | Admitting: Emergency Medicine

## 2017-01-14 ENCOUNTER — Encounter (HOSPITAL_COMMUNITY): Payer: Self-pay | Admitting: *Deleted

## 2017-01-14 DIAGNOSIS — M542 Cervicalgia: Secondary | ICD-10-CM | POA: Diagnosis not present

## 2017-01-14 DIAGNOSIS — Z87891 Personal history of nicotine dependence: Secondary | ICD-10-CM | POA: Insufficient documentation

## 2017-01-14 DIAGNOSIS — Z853 Personal history of malignant neoplasm of breast: Secondary | ICD-10-CM | POA: Insufficient documentation

## 2017-01-14 DIAGNOSIS — Z7982 Long term (current) use of aspirin: Secondary | ICD-10-CM | POA: Diagnosis not present

## 2017-01-14 MED ORDER — CYCLOBENZAPRINE HCL 10 MG PO TABS: 10.0000 mg | ORAL_TABLET | Freq: Two times a day (BID) | ORAL | 0 refills | Status: DC | PRN

## 2017-01-14 NOTE — ED Provider Notes (Signed)
[facility] DEPT Provider Note   CSN: [id number] Arrival date & time: 01/14/17  1352  By signing my name below, I, Autumn Frazier, attest that this documentation has been prepared under the direction and in the presence of Malvin Johns, MD. Electronically Signed: Reinaldo Meeker, Scribe. 01/14/2017. 3:43 PM.  History   Chief Complaint Chief Complaint  Patient presents with  . Neck Pain   The history is provided by the patient and medical records. No language interpreter was used.    HPI Comments:  Autumn Frazier is a 76 y.o. female with a PMhx of Breast CA, who presents to the Emergency Department complaining of intermittent, non-radiating, left-sided neck pain onset this morning about 10:00 AM. Pt reports waking up this morning with her neck feeling sore. Over the following three hours, the pain increased alongside of intermittent, multiple, muscle spasms throughout the day. She states the spasm "felt like a labor pain" and was 10/10 painful during the spasm. No recent injury or past h/o neck issues. She tried advil and a heat compress at home with no relief of her pain. Movement of her neck exacerbates the pain. Pt denies current pain, fever, weakness, numbness, and any other complaints at this time.   Past Medical History:  Diagnosis Date  . Breast cancer ([place])   . Breast cancer of lower-outer quadrant of left female breast ([place]) 11/02/2015  . Hx of radiation therapy 01/09/16- 02/06/16   Left Breast   Patient Active Problem List   Diagnosis Date Noted  . Osteopenia determined by x-ray 03/18/2016  . Genetic testing 12/08/2015  . Breast cancer of lower-outer quadrant of left female breast ([place]) 11/02/2015   Past Surgical History:  Procedure Laterality Date  . BREAST BIOPSY Left 11/06/2015   CA  . BREAST LUMPECTOMY Left   . herniated disc    . RADIOACTIVE SEED GUIDED MASTECTOMY WITH AXILLARY SENTINEL LYMPH NODE BIOPSY Left 11/27/2015   Procedure: LEFT BREAST SEED AND WIRE GUIDED  LUMPECTOMY WITH LEFT AXILLARY LYMPH NODE BIOPSY;  Surgeon: Rolm Bookbinder, MD;  Location: [facility];  Service: General;  Laterality: Left;  . TONSILLECTOMY     OB History    No data available     Home Medications    Prior to Admission medications   Medication Sig Start Date End Date Taking? Authorizing Provider  anastrozole (ARIMIDEX) 1 MG tablet Take 1 tablet (1 mg total) by mouth daily. 03/18/16   Chauncey Cruel, MD  aspirin 81 MG tablet Take 81 mg by mouth daily.    Historical Provider, MD  b complex vitamins tablet Take 1 tablet by mouth daily.    Historical Provider, MD  Biotin 5000 MCG TABS Take by mouth.    Historical Provider, MD  cyclobenzaprine (FLEXERIL) 10 MG tablet Take 1 tablet (10 mg total) by mouth 2 (two) times daily as needed for muscle spasms. 01/14/17   Malvin Johns, MD  Fluticasone-Salmeterol (ADVAIR) 100-50 MCG/DOSE AEPB Inhale 1 puff into the lungs every 12 (twelve) hours. 08/17/12   Neena Rhymes, MD  levothyroxine (SYNTHROID, LEVOTHROID) 137 MCG tablet Patient is taking 157mcg daily 11/09/15   Historical Provider, MD  Vitamin D, Ergocalciferol, (DRISDOL) 50000 units CAPS capsule Take 50,000 Units by mouth every 7 (seven) days.    Historical Provider, MD    Family History Family History  Problem Relation Age of Onset  . Brain cancer Sister   . Ovarian cancer Maternal Aunt     Social History Social  History  Substance Use Topics  . Smoking status: Former Research scientist (life sciences)  . Smokeless tobacco: Not on file  . Alcohol use 1.8 oz/week    3 Glasses of wine per week     Allergies   Patient has no known allergies.   Review of Systems Review of Systems  Constitutional: Negative for fever.  Gastrointestinal: Negative for nausea and vomiting.  Musculoskeletal: Positive for myalgias and neck pain. Negative for arthralgias, back pain and joint swelling.  Skin: Negative for wound.  Neurological: Negative for weakness, numbness and headaches.      Physical Exam Updated Vital Signs BP (!) 156/82 (BP Location: Right Arm)   Pulse 84   Temp 97.6 F (36.4 C) (Oral)   Resp 18   SpO2 96%   Physical Exam  Constitutional: She is oriented to person, place, and time. She appears well-developed and well-nourished.  HENT:  Head: Normocephalic and atraumatic.  Neck: Normal range of motion. Neck supple.  Cardiovascular: Normal rate.   Pulmonary/Chest: Effort normal.  Musculoskeletal: She exhibits tenderness.  No TTP of the cervical spine. Normal motor function and sensation of upper extremities. Bilateral radial pulses intact.   Neurological: She is alert and oriented to person, place, and time.  Skin: Skin is warm and dry.  Psychiatric: She has a normal mood and affect.    ED Treatments / Results  DIAGNOSTIC STUDIES:  Oxygen Saturation is 96% on RA, normal by my interpretation.    COORDINATION OF CARE:  3:42 PM Discussed treatment plan with pt at bedside including EKG with a muscle relaxant and pt agreed to plan.  Labs (all labs ordered are listed, but only abnormal results are displayed) Labs Reviewed - No data to display  EKG  EKG Interpretation  Date/Time:  Tuesday January 14 2017 14:19:45 EDT Ventricular Rate:  79 PR Interval:  144 QRS Duration: 78 QT Interval:  376 QTC Calculation: 431 R Axis:   15 Text Interpretation:  Sinus rhythm with occasional Premature ventricular complexes Otherwise normal ECG since last tracing no significant change Confirmed by Erilyn Pearman  MD, Sheldon Amara ([id number]) on 01/14/2017 4:12:42 PM       Radiology No results found.  Procedures Procedures (including critical care time)  Medications Ordered in ED Medications - No data to display   Initial Impression / Assessment and Plan / ED Course  I have reviewed the triage vital signs and the nursing notes.  Pertinent labs & imaging results that were available during my care of the patient were reviewed by me and considered in my medical  decision making (see chart for details).    Patient presents with pain to left side of her neck. It's not reproducible on palpation but it occurs anytime she turns her head to the left. She has no neurologic deficits or radicular symptoms. I feel this is musculoskeletal. Her EKG doesn't show any ischemic changes. She has no other cardiac symptoms. She requests a muscle relaxer. She states that she normally doesn't have problem taking medications. I will prescribe her Flexeril but I did advise her to use caution that it may cause increased sleepiness and unsteady gait. She will also continue taking her ibuprofen. She states that she normally does not have problem with NSAIDs. I advised her take with food and stop if she has any stomach irritation. I encouraged her have close follow-up with her PCP. Return precautions were given.   Final Clinical Impressions(s) / ED Diagnoses   Final diagnoses:  Neck pain    New  Prescriptions Discharge Medication List as of 01/14/2017  3:54 PM    START taking these medications   Details  cyclobenzaprine (FLEXERIL) 10 MG tablet Take 1 tablet (10 mg total) by mouth 2 (two) times daily as needed for muscle spasms., Starting Tue 01/14/2017, Print        I personally performed the services described in this documentation, which was scribed in my presence.  The recorded information has been reviewed and considered.     Malvin Johns, MD 01/14/17 [phone]

## 2017-01-14 NOTE — ED Triage Notes (Signed)
Pt states that she began having intermittent left neck pain. Pt states that it feels like a muscle spasm.

## 2017-01-21 ENCOUNTER — Other Ambulatory Visit: Payer: Commercial Managed Care - HMO

## 2017-01-21 ENCOUNTER — Ambulatory Visit: Payer: Commercial Managed Care - HMO | Admitting: Oncology

## 2017-02-27 DIAGNOSIS — R319 Hematuria, unspecified: Secondary | ICD-10-CM | POA: Diagnosis not present

## 2017-02-27 DIAGNOSIS — R109 Unspecified abdominal pain: Secondary | ICD-10-CM | POA: Diagnosis not present

## 2017-02-27 DIAGNOSIS — N920 Excessive and frequent menstruation with regular cycle: Secondary | ICD-10-CM | POA: Diagnosis not present

## 2017-02-27 DIAGNOSIS — R8290 Unspecified abnormal findings in urine: Secondary | ICD-10-CM | POA: Diagnosis not present

## 2017-03-06 DIAGNOSIS — N950 Postmenopausal bleeding: Secondary | ICD-10-CM | POA: Diagnosis not present

## 2017-03-06 DIAGNOSIS — R32 Unspecified urinary incontinence: Secondary | ICD-10-CM | POA: Diagnosis not present

## 2017-03-11 ENCOUNTER — Other Ambulatory Visit: Payer: Self-pay | Admitting: Nurse Practitioner

## 2017-03-11 DIAGNOSIS — N8500 Endometrial hyperplasia, unspecified: Secondary | ICD-10-CM | POA: Diagnosis not present

## 2017-03-11 DIAGNOSIS — N950 Postmenopausal bleeding: Secondary | ICD-10-CM | POA: Diagnosis not present

## 2017-03-11 DIAGNOSIS — N858 Other specified noninflammatory disorders of uterus: Secondary | ICD-10-CM | POA: Diagnosis not present

## 2017-03-17 DIAGNOSIS — Z923 Personal history of irradiation: Secondary | ICD-10-CM | POA: Diagnosis not present

## 2017-03-17 DIAGNOSIS — N950 Postmenopausal bleeding: Secondary | ICD-10-CM | POA: Diagnosis not present

## 2017-03-17 DIAGNOSIS — Z8041 Family history of malignant neoplasm of ovary: Secondary | ICD-10-CM | POA: Diagnosis not present

## 2017-03-17 DIAGNOSIS — Z9889 Other specified postprocedural states: Secondary | ICD-10-CM | POA: Diagnosis not present

## 2017-03-17 DIAGNOSIS — R87619 Unspecified abnormal cytological findings in specimens from cervix uteri: Secondary | ICD-10-CM | POA: Diagnosis not present

## 2017-03-17 DIAGNOSIS — Z5181 Encounter for therapeutic drug level monitoring: Secondary | ICD-10-CM | POA: Diagnosis not present

## 2017-03-17 DIAGNOSIS — Z79811 Long term (current) use of aromatase inhibitors: Secondary | ICD-10-CM | POA: Diagnosis not present

## 2017-03-17 DIAGNOSIS — I10 Essential (primary) hypertension: Secondary | ICD-10-CM | POA: Diagnosis not present

## 2017-03-17 DIAGNOSIS — C50912 Malignant neoplasm of unspecified site of left female breast: Secondary | ICD-10-CM | POA: Diagnosis not present

## 2017-03-18 DIAGNOSIS — N8500 Endometrial hyperplasia, unspecified: Secondary | ICD-10-CM | POA: Diagnosis not present

## 2017-03-25 DIAGNOSIS — Z79899 Other long term (current) drug therapy: Secondary | ICD-10-CM | POA: Diagnosis not present

## 2017-03-25 DIAGNOSIS — Z01818 Encounter for other preprocedural examination: Secondary | ICD-10-CM | POA: Diagnosis not present

## 2017-03-25 DIAGNOSIS — Z7982 Long term (current) use of aspirin: Secondary | ICD-10-CM | POA: Diagnosis not present

## 2017-03-27 DIAGNOSIS — E039 Hypothyroidism, unspecified: Secondary | ICD-10-CM | POA: Diagnosis not present

## 2017-03-31 DIAGNOSIS — R87619 Unspecified abnormal cytological findings in specimens from cervix uteri: Secondary | ICD-10-CM | POA: Diagnosis not present

## 2017-03-31 DIAGNOSIS — Z79899 Other long term (current) drug therapy: Secondary | ICD-10-CM | POA: Diagnosis not present

## 2017-03-31 DIAGNOSIS — Z01812 Encounter for preprocedural laboratory examination: Secondary | ICD-10-CM | POA: Diagnosis not present

## 2017-03-31 DIAGNOSIS — Z7982 Long term (current) use of aspirin: Secondary | ICD-10-CM | POA: Diagnosis not present

## 2017-04-01 DIAGNOSIS — C541 Malignant neoplasm of endometrium: Secondary | ICD-10-CM | POA: Diagnosis not present

## 2017-04-01 DIAGNOSIS — N6019 Diffuse cystic mastopathy of unspecified breast: Secondary | ICD-10-CM | POA: Diagnosis not present

## 2017-04-01 DIAGNOSIS — E039 Hypothyroidism, unspecified: Secondary | ICD-10-CM | POA: Diagnosis not present

## 2017-04-01 DIAGNOSIS — Z853 Personal history of malignant neoplasm of breast: Secondary | ICD-10-CM | POA: Diagnosis not present

## 2017-04-01 DIAGNOSIS — I10 Essential (primary) hypertension: Secondary | ICD-10-CM | POA: Diagnosis not present

## 2017-04-01 DIAGNOSIS — Z8041 Family history of malignant neoplasm of ovary: Secondary | ICD-10-CM | POA: Diagnosis not present

## 2017-04-01 DIAGNOSIS — C775 Secondary and unspecified malignant neoplasm of intrapelvic lymph nodes: Secondary | ICD-10-CM | POA: Diagnosis not present

## 2017-04-01 DIAGNOSIS — Z923 Personal history of irradiation: Secondary | ICD-10-CM | POA: Diagnosis not present

## 2017-04-01 HISTORY — PX: ROBOTIC ASSISTED TOTAL HYSTERECTOMY WITH BILATERAL SALPINGO OOPHERECTOMY: SHX6086

## 2017-04-02 DIAGNOSIS — E039 Hypothyroidism, unspecified: Secondary | ICD-10-CM | POA: Diagnosis not present

## 2017-04-02 DIAGNOSIS — I10 Essential (primary) hypertension: Secondary | ICD-10-CM | POA: Diagnosis not present

## 2017-04-02 DIAGNOSIS — N6019 Diffuse cystic mastopathy of unspecified breast: Secondary | ICD-10-CM | POA: Diagnosis not present

## 2017-04-02 DIAGNOSIS — Z853 Personal history of malignant neoplasm of breast: Secondary | ICD-10-CM | POA: Diagnosis not present

## 2017-04-02 DIAGNOSIS — Z923 Personal history of irradiation: Secondary | ICD-10-CM | POA: Diagnosis not present

## 2017-04-02 DIAGNOSIS — C775 Secondary and unspecified malignant neoplasm of intrapelvic lymph nodes: Secondary | ICD-10-CM | POA: Diagnosis not present

## 2017-04-02 DIAGNOSIS — Z8041 Family history of malignant neoplasm of ovary: Secondary | ICD-10-CM | POA: Diagnosis not present

## 2017-04-02 DIAGNOSIS — C541 Malignant neoplasm of endometrium: Secondary | ICD-10-CM | POA: Diagnosis not present

## 2017-04-10 DIAGNOSIS — Z90710 Acquired absence of both cervix and uterus: Secondary | ICD-10-CM | POA: Diagnosis not present

## 2017-04-10 DIAGNOSIS — C541 Malignant neoplasm of endometrium: Secondary | ICD-10-CM | POA: Diagnosis not present

## 2017-04-10 DIAGNOSIS — Z9079 Acquired absence of other genital organ(s): Secondary | ICD-10-CM | POA: Diagnosis not present

## 2017-04-10 DIAGNOSIS — E039 Hypothyroidism, unspecified: Secondary | ICD-10-CM | POA: Diagnosis not present

## 2017-04-10 DIAGNOSIS — C55 Malignant neoplasm of uterus, part unspecified: Secondary | ICD-10-CM | POA: Diagnosis not present

## 2017-04-10 DIAGNOSIS — I10 Essential (primary) hypertension: Secondary | ICD-10-CM | POA: Diagnosis not present

## 2017-04-10 DIAGNOSIS — Z90722 Acquired absence of ovaries, bilateral: Secondary | ICD-10-CM | POA: Diagnosis not present

## 2017-04-14 DIAGNOSIS — C50912 Malignant neoplasm of unspecified site of left female breast: Secondary | ICD-10-CM | POA: Diagnosis not present

## 2017-04-14 DIAGNOSIS — Z853 Personal history of malignant neoplasm of breast: Secondary | ICD-10-CM | POA: Diagnosis not present

## 2017-04-15 DIAGNOSIS — C55 Malignant neoplasm of uterus, part unspecified: Secondary | ICD-10-CM | POA: Diagnosis not present

## 2017-04-15 DIAGNOSIS — I10 Essential (primary) hypertension: Secondary | ICD-10-CM | POA: Diagnosis not present

## 2017-04-15 DIAGNOSIS — C541 Malignant neoplasm of endometrium: Secondary | ICD-10-CM | POA: Diagnosis not present

## 2017-04-15 DIAGNOSIS — E039 Hypothyroidism, unspecified: Secondary | ICD-10-CM | POA: Diagnosis not present

## 2017-04-22 DIAGNOSIS — I10 Essential (primary) hypertension: Secondary | ICD-10-CM | POA: Diagnosis not present

## 2017-04-22 DIAGNOSIS — Z79899 Other long term (current) drug therapy: Secondary | ICD-10-CM | POA: Diagnosis not present

## 2017-04-22 DIAGNOSIS — C55 Malignant neoplasm of uterus, part unspecified: Secondary | ICD-10-CM | POA: Diagnosis not present

## 2017-04-22 DIAGNOSIS — C541 Malignant neoplasm of endometrium: Secondary | ICD-10-CM | POA: Diagnosis not present

## 2017-04-22 DIAGNOSIS — Z136 Encounter for screening for cardiovascular disorders: Secondary | ICD-10-CM | POA: Diagnosis not present

## 2017-04-23 DIAGNOSIS — C541 Malignant neoplasm of endometrium: Secondary | ICD-10-CM | POA: Diagnosis not present

## 2017-04-23 DIAGNOSIS — Z452 Encounter for adjustment and management of vascular access device: Secondary | ICD-10-CM | POA: Diagnosis not present

## 2017-04-23 DIAGNOSIS — C50912 Malignant neoplasm of unspecified site of left female breast: Secondary | ICD-10-CM | POA: Diagnosis not present

## 2017-04-23 DIAGNOSIS — C55 Malignant neoplasm of uterus, part unspecified: Secondary | ICD-10-CM | POA: Diagnosis not present

## 2017-04-23 DIAGNOSIS — C799 Secondary malignant neoplasm of unspecified site: Secondary | ICD-10-CM | POA: Diagnosis not present

## 2017-04-23 DIAGNOSIS — Z9221 Personal history of antineoplastic chemotherapy: Secondary | ICD-10-CM

## 2017-04-23 DIAGNOSIS — Z853 Personal history of malignant neoplasm of breast: Secondary | ICD-10-CM | POA: Diagnosis not present

## 2017-04-23 HISTORY — DX: Personal history of antineoplastic chemotherapy: Z92.21

## 2017-04-24 DIAGNOSIS — E039 Hypothyroidism, unspecified: Secondary | ICD-10-CM | POA: Diagnosis not present

## 2017-04-24 DIAGNOSIS — Z5111 Encounter for antineoplastic chemotherapy: Secondary | ICD-10-CM | POA: Diagnosis not present

## 2017-04-24 DIAGNOSIS — I10 Essential (primary) hypertension: Secondary | ICD-10-CM | POA: Diagnosis not present

## 2017-04-24 DIAGNOSIS — C541 Malignant neoplasm of endometrium: Secondary | ICD-10-CM | POA: Diagnosis not present

## 2017-05-08 DIAGNOSIS — I10 Essential (primary) hypertension: Secondary | ICD-10-CM | POA: Diagnosis not present

## 2017-05-08 DIAGNOSIS — C574 Malignant neoplasm of uterine adnexa, unspecified: Secondary | ICD-10-CM | POA: Diagnosis not present

## 2017-05-08 DIAGNOSIS — Z90722 Acquired absence of ovaries, bilateral: Secondary | ICD-10-CM | POA: Diagnosis not present

## 2017-05-08 DIAGNOSIS — Z79811 Long term (current) use of aromatase inhibitors: Secondary | ICD-10-CM | POA: Diagnosis not present

## 2017-05-08 DIAGNOSIS — E039 Hypothyroidism, unspecified: Secondary | ICD-10-CM | POA: Diagnosis not present

## 2017-05-08 DIAGNOSIS — Z9079 Acquired absence of other genital organ(s): Secondary | ICD-10-CM | POA: Diagnosis not present

## 2017-05-08 DIAGNOSIS — Z5181 Encounter for therapeutic drug level monitoring: Secondary | ICD-10-CM | POA: Diagnosis not present

## 2017-05-08 DIAGNOSIS — C541 Malignant neoplasm of endometrium: Secondary | ICD-10-CM | POA: Diagnosis not present

## 2017-05-08 DIAGNOSIS — C775 Secondary and unspecified malignant neoplasm of intrapelvic lymph nodes: Secondary | ICD-10-CM | POA: Diagnosis not present

## 2017-05-08 DIAGNOSIS — Z90710 Acquired absence of both cervix and uterus: Secondary | ICD-10-CM | POA: Diagnosis not present

## 2017-05-08 DIAGNOSIS — Z5111 Encounter for antineoplastic chemotherapy: Secondary | ICD-10-CM | POA: Diagnosis not present

## 2017-05-08 DIAGNOSIS — C50912 Malignant neoplasm of unspecified site of left female breast: Secondary | ICD-10-CM | POA: Diagnosis not present

## 2017-05-08 DIAGNOSIS — Z9889 Other specified postprocedural states: Secondary | ICD-10-CM | POA: Diagnosis not present

## 2017-05-09 DIAGNOSIS — K7689 Other specified diseases of liver: Secondary | ICD-10-CM | POA: Diagnosis not present

## 2017-05-09 DIAGNOSIS — C541 Malignant neoplasm of endometrium: Secondary | ICD-10-CM | POA: Diagnosis not present

## 2017-05-09 DIAGNOSIS — N281 Cyst of kidney, acquired: Secondary | ICD-10-CM | POA: Diagnosis not present

## 2017-05-09 DIAGNOSIS — K5790 Diverticulosis of intestine, part unspecified, without perforation or abscess without bleeding: Secondary | ICD-10-CM | POA: Diagnosis not present

## 2017-05-15 DIAGNOSIS — Z5111 Encounter for antineoplastic chemotherapy: Secondary | ICD-10-CM | POA: Diagnosis not present

## 2017-05-15 DIAGNOSIS — E039 Hypothyroidism, unspecified: Secondary | ICD-10-CM | POA: Diagnosis not present

## 2017-05-15 DIAGNOSIS — C541 Malignant neoplasm of endometrium: Secondary | ICD-10-CM | POA: Diagnosis not present

## 2017-05-15 DIAGNOSIS — I10 Essential (primary) hypertension: Secondary | ICD-10-CM | POA: Diagnosis not present

## 2017-05-20 IMAGING — US US BREAST BX W LOC DEV 1ST LESION IMG BX SPEC US GUIDE*L*
1 series · 13 of 16 positions shown · non-contrast
Comparison: Previous exam(s).

ADDENDUM:
The pathology associated with the left breast ultrasound-guided core
biopsy of the small mass located at the 3:30 o'clock position
demonstrated invasive ductal carcinoma with DCIS felt to be grade 1.
This is concordant. The pathology associated with the small group of
calcifications located within the left breast at the 12:30 o'clock
position (anterior and superior to the mass) demonstrated
fibrocystic changes with calcifications and adenosis but no
malignancy or atypia. This is also concordant. I have discussed the
findings with patient and her husband and answered their questions.
The patient's biopsy site is clean and dry without hematoma
formation or signs of infection. She is mild to moderately tender.

The patient is currently scheduled on [REDACTED] 11/06/2015 for an
additional stereotactic biopsy of the 11 mm group of
microcalcifications located posterior and slightly lateral to the
mass. The patient is also scheduled for the breast cancer multi
disciplinary clinic on 11/08/2015. Post biopsy wound care
instructions were reviewed with patient. Also, Queenie Gundersen, nurse
navigator reviewed educational materials with the patient. The
patient was encouraged to call the [REDACTED] for additional
questions or concerns.
CLINICAL DATA: Left breast mass.
EXAM:
ULTRASOUND GUIDED LEFT BREAST CORE NEEDLE BIOPSY

[Series 1: us breast bx w loc dev 1st lesion img bx spec us g · 0.06mm/px · 13 of 16 slices shown]
[im 1/16]
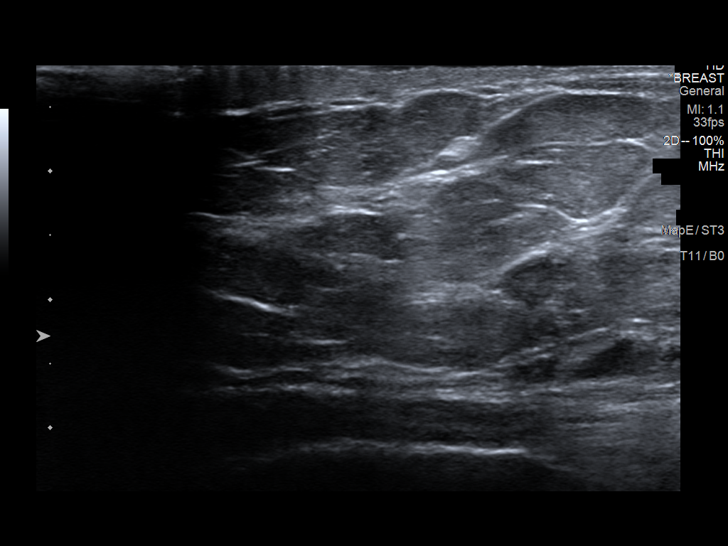
[im 2/16]
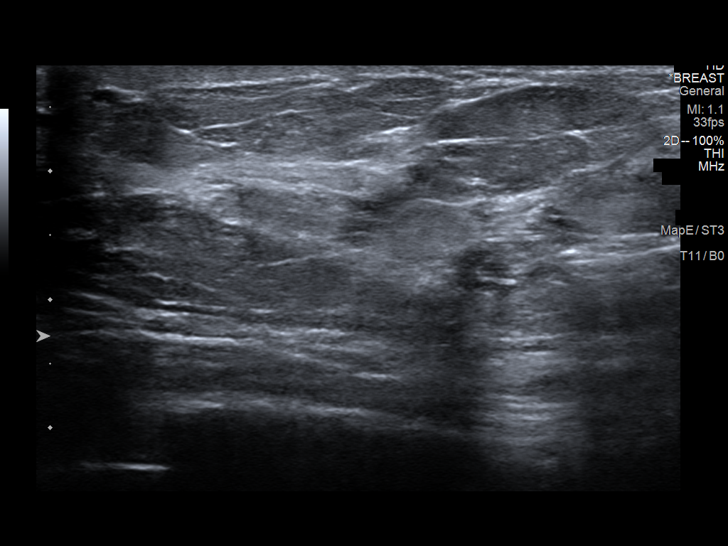
[im 4/16]
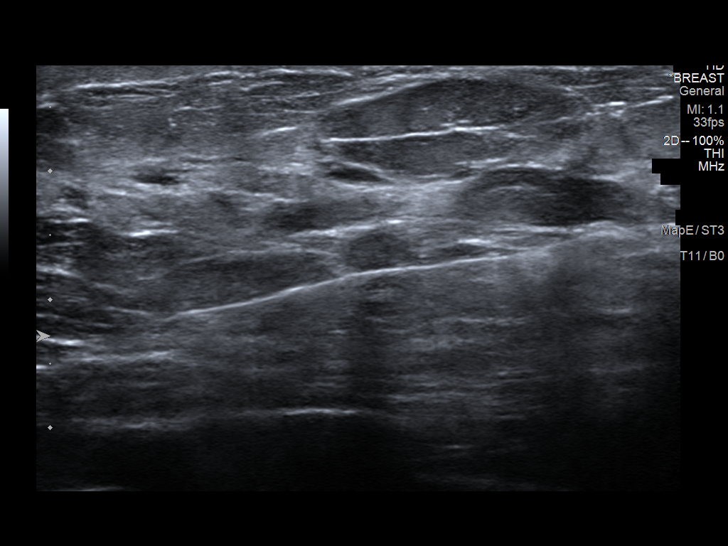
[im 5/16]
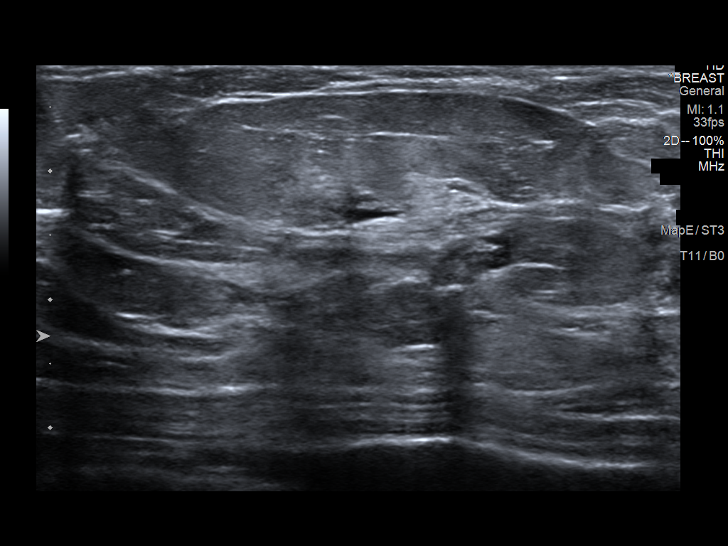
[im 6/16]
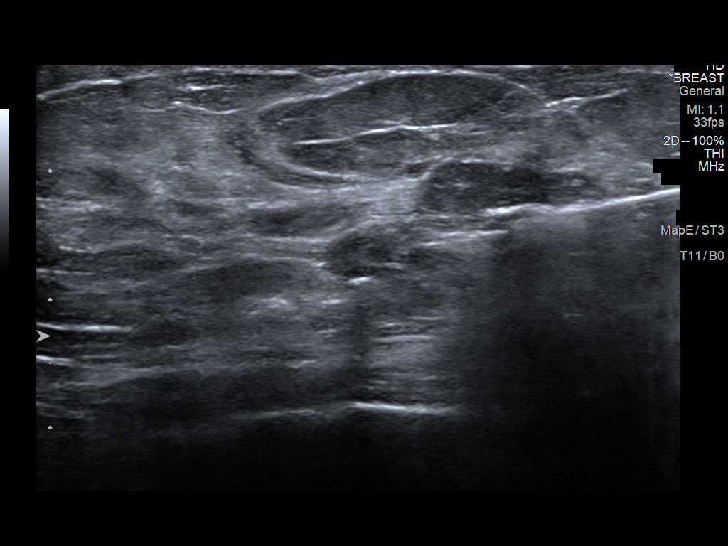
[im 7/16]
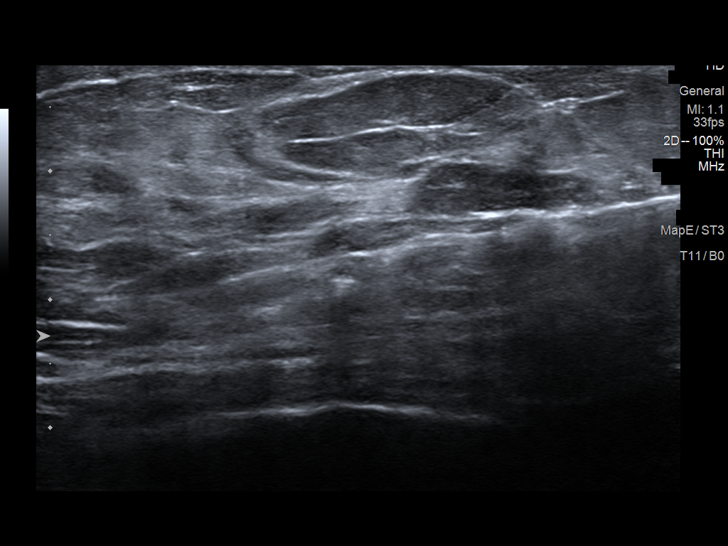
[im 9/16]
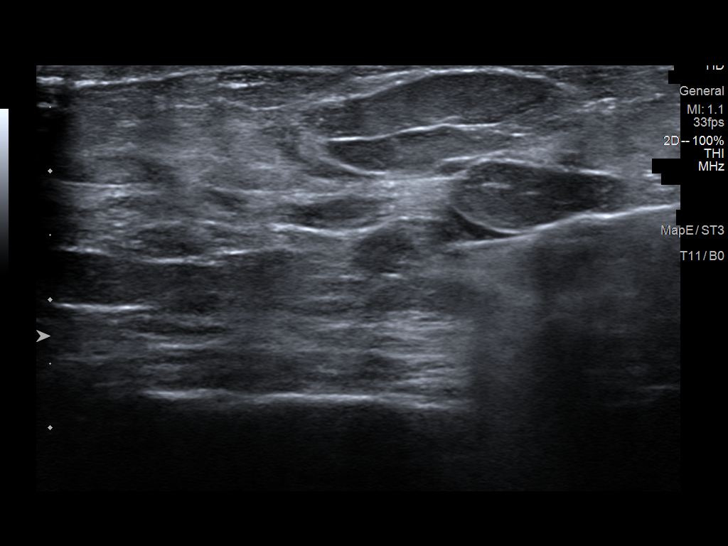
[im 10/16]
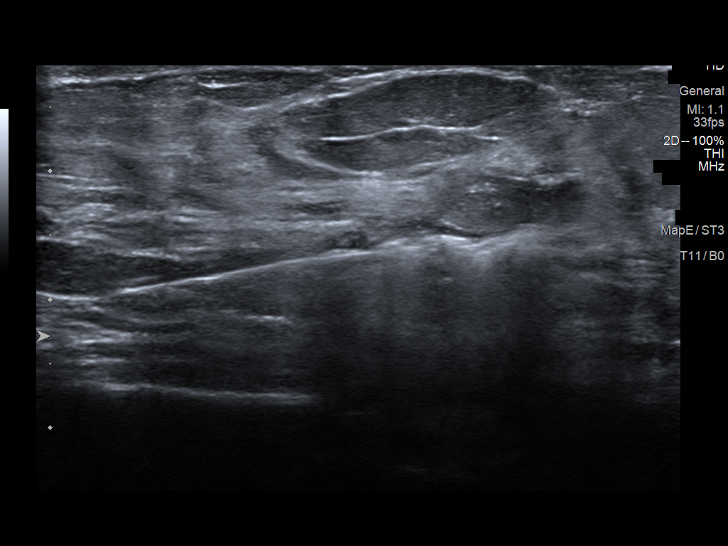
[im 11/16]
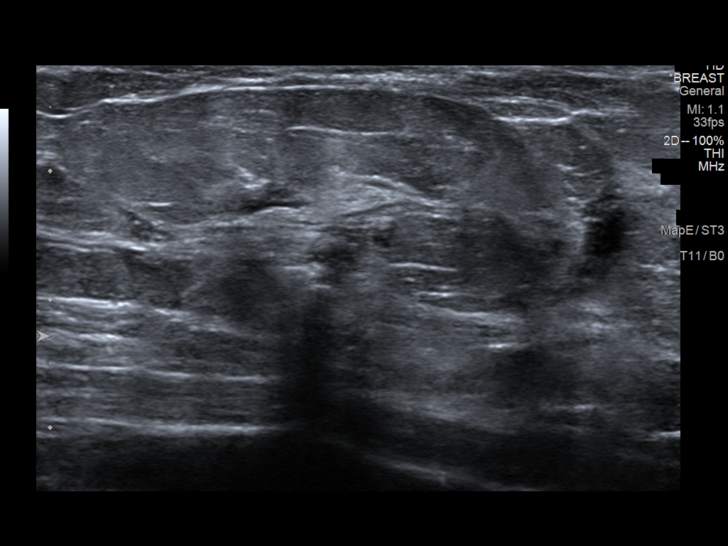
[im 12/16]
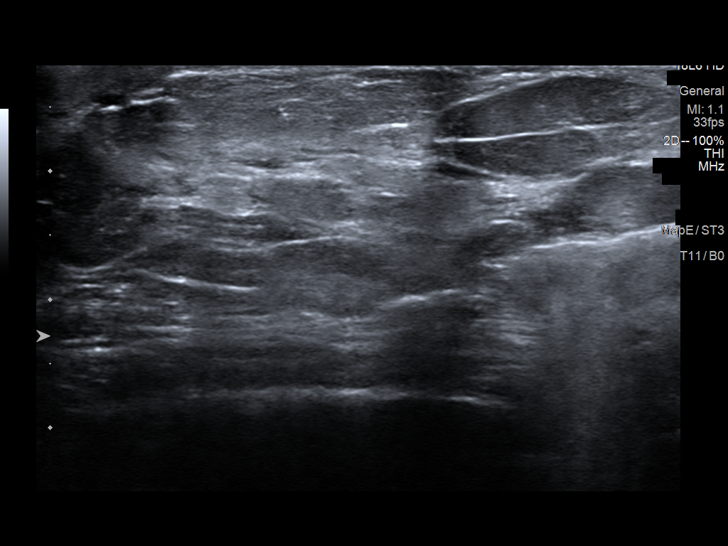
[im 13/16]
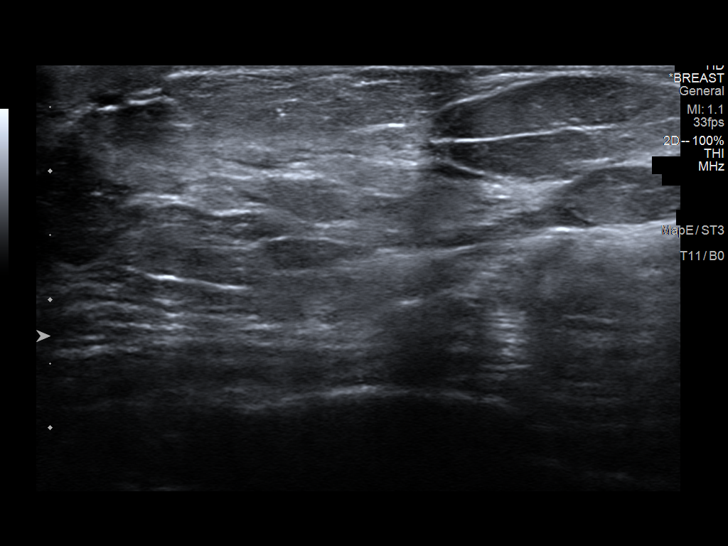
[im 15/16]
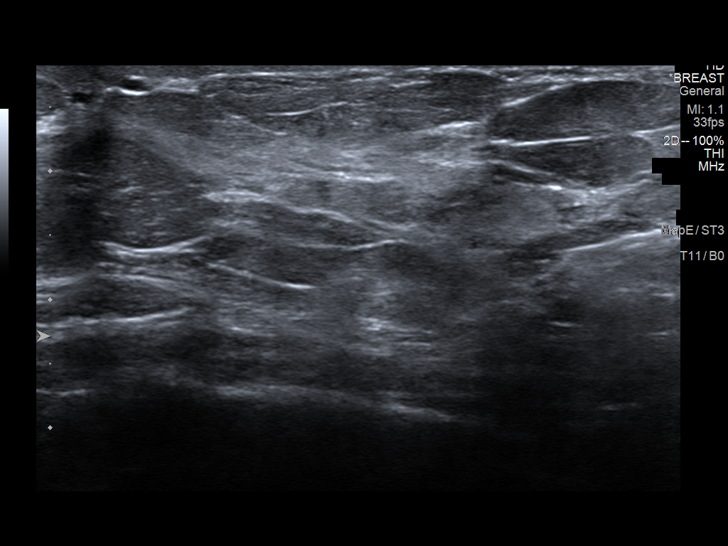
[im 16/16]
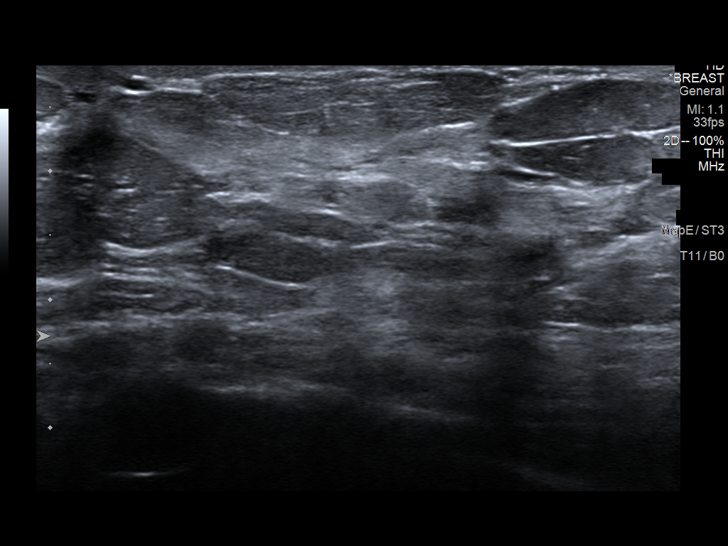

[13 of 16 positions shown; findings below may reference images not displayed]



Using sterile technique and 1% Lidocaine as local anesthetic, under
direct ultrasound visualization, a 14 gauge Genna device was
used to perform biopsy of the small irregular mass located within
the left breast at the 3:30 o'clock position 2 cm from the nipple
using a lateral approach. At the conclusion of the procedure a
ribbon shaped tissue marker clip was deployed into the biopsy
cavity. Follow up 2 view mammogram was performed and dictated
separately.
IMPRESSION: Ultrasound guided biopsy of the left breast mass located at the 3:30
o'clock position 2 cm from the nipple as discussed above. No
apparent complications.

## 2017-05-29 ENCOUNTER — Other Ambulatory Visit: Payer: Self-pay | Admitting: Oncology

## 2017-05-29 ENCOUNTER — Telehealth: Payer: Self-pay

## 2017-05-29 DIAGNOSIS — Z90722 Acquired absence of ovaries, bilateral: Secondary | ICD-10-CM | POA: Diagnosis not present

## 2017-05-29 DIAGNOSIS — I10 Essential (primary) hypertension: Secondary | ICD-10-CM | POA: Diagnosis not present

## 2017-05-29 DIAGNOSIS — R110 Nausea: Secondary | ICD-10-CM | POA: Diagnosis not present

## 2017-05-29 DIAGNOSIS — Z9079 Acquired absence of other genital organ(s): Secondary | ICD-10-CM | POA: Diagnosis not present

## 2017-05-29 DIAGNOSIS — E039 Hypothyroidism, unspecified: Secondary | ICD-10-CM | POA: Diagnosis not present

## 2017-05-29 DIAGNOSIS — T451X5A Adverse effect of antineoplastic and immunosuppressive drugs, initial encounter: Secondary | ICD-10-CM | POA: Diagnosis not present

## 2017-05-29 DIAGNOSIS — Z90710 Acquired absence of both cervix and uterus: Secondary | ICD-10-CM | POA: Diagnosis not present

## 2017-05-29 DIAGNOSIS — C541 Malignant neoplasm of endometrium: Secondary | ICD-10-CM | POA: Diagnosis not present

## 2017-05-29 DIAGNOSIS — Z8041 Family history of malignant neoplasm of ovary: Secondary | ICD-10-CM | POA: Diagnosis not present

## 2017-05-29 DIAGNOSIS — G629 Polyneuropathy, unspecified: Secondary | ICD-10-CM | POA: Diagnosis not present

## 2017-05-29 DIAGNOSIS — T451X5D Adverse effect of antineoplastic and immunosuppressive drugs, subsequent encounter: Secondary | ICD-10-CM | POA: Diagnosis not present

## 2017-05-29 DIAGNOSIS — K5900 Constipation, unspecified: Secondary | ICD-10-CM | POA: Diagnosis not present

## 2017-05-29 DIAGNOSIS — C574 Malignant neoplasm of uterine adnexa, unspecified: Secondary | ICD-10-CM | POA: Diagnosis not present

## 2017-05-29 NOTE — Telephone Encounter (Signed)
Called PT to inform her of her script refill, no voicemail available.

## 2017-06-05 DIAGNOSIS — C541 Malignant neoplasm of endometrium: Secondary | ICD-10-CM | POA: Diagnosis not present

## 2017-06-05 DIAGNOSIS — Z5111 Encounter for antineoplastic chemotherapy: Secondary | ICD-10-CM | POA: Diagnosis not present

## 2017-06-19 DIAGNOSIS — Z5111 Encounter for antineoplastic chemotherapy: Secondary | ICD-10-CM | POA: Diagnosis not present

## 2017-06-19 DIAGNOSIS — C541 Malignant neoplasm of endometrium: Secondary | ICD-10-CM | POA: Diagnosis not present

## 2017-06-23 ENCOUNTER — Encounter: Payer: Self-pay | Admitting: Radiation Oncology

## 2017-06-26 DIAGNOSIS — Z79899 Other long term (current) drug therapy: Secondary | ICD-10-CM | POA: Diagnosis not present

## 2017-06-26 DIAGNOSIS — C775 Secondary and unspecified malignant neoplasm of intrapelvic lymph nodes: Secondary | ICD-10-CM | POA: Diagnosis not present

## 2017-06-26 DIAGNOSIS — F329 Major depressive disorder, single episode, unspecified: Secondary | ICD-10-CM | POA: Diagnosis not present

## 2017-06-26 DIAGNOSIS — Z90722 Acquired absence of ovaries, bilateral: Secondary | ICD-10-CM | POA: Diagnosis not present

## 2017-06-26 DIAGNOSIS — C541 Malignant neoplasm of endometrium: Secondary | ICD-10-CM | POA: Diagnosis not present

## 2017-06-26 DIAGNOSIS — Z5181 Encounter for therapeutic drug level monitoring: Secondary | ICD-10-CM | POA: Diagnosis not present

## 2017-06-26 DIAGNOSIS — Z5111 Encounter for antineoplastic chemotherapy: Secondary | ICD-10-CM | POA: Diagnosis not present

## 2017-06-26 DIAGNOSIS — C50912 Malignant neoplasm of unspecified site of left female breast: Secondary | ICD-10-CM | POA: Diagnosis not present

## 2017-06-26 DIAGNOSIS — I10 Essential (primary) hypertension: Secondary | ICD-10-CM | POA: Diagnosis not present

## 2017-06-26 DIAGNOSIS — E039 Hypothyroidism, unspecified: Secondary | ICD-10-CM | POA: Diagnosis not present

## 2017-06-26 DIAGNOSIS — F4321 Adjustment disorder with depressed mood: Secondary | ICD-10-CM | POA: Diagnosis not present

## 2017-06-26 DIAGNOSIS — Z79811 Long term (current) use of aromatase inhibitors: Secondary | ICD-10-CM | POA: Diagnosis not present

## 2017-06-26 DIAGNOSIS — G629 Polyneuropathy, unspecified: Secondary | ICD-10-CM | POA: Diagnosis not present

## 2017-06-26 DIAGNOSIS — C574 Malignant neoplasm of uterine adnexa, unspecified: Secondary | ICD-10-CM | POA: Diagnosis not present

## 2017-06-26 DIAGNOSIS — Z90710 Acquired absence of both cervix and uterus: Secondary | ICD-10-CM | POA: Diagnosis not present

## 2017-06-30 ENCOUNTER — Ambulatory Visit (HOSPITAL_BASED_OUTPATIENT_CLINIC_OR_DEPARTMENT_OTHER): Payer: Medicare HMO | Admitting: Oncology

## 2017-06-30 ENCOUNTER — Institutional Professional Consult (permissible substitution): Payer: Self-pay | Admitting: Radiation Oncology

## 2017-06-30 ENCOUNTER — Other Ambulatory Visit (HOSPITAL_BASED_OUTPATIENT_CLINIC_OR_DEPARTMENT_OTHER): Payer: Medicare HMO

## 2017-06-30 ENCOUNTER — Telehealth: Payer: Self-pay | Admitting: Oncology

## 2017-06-30 VITALS — BP 154/103 | HR 99 | Temp 98.5°F | Resp 17 | Ht 66.5 in | Wt 164.3 lb

## 2017-06-30 DIAGNOSIS — C541 Malignant neoplasm of endometrium: Secondary | ICD-10-CM | POA: Diagnosis not present

## 2017-06-30 DIAGNOSIS — C50512 Malignant neoplasm of lower-outer quadrant of left female breast: Secondary | ICD-10-CM

## 2017-06-30 DIAGNOSIS — Z79811 Long term (current) use of aromatase inhibitors: Secondary | ICD-10-CM

## 2017-06-30 DIAGNOSIS — Z170 Estrogen receptor positive status [ER+]: Secondary | ICD-10-CM

## 2017-06-30 DIAGNOSIS — M8580 Other specified disorders of bone density and structure, unspecified site: Secondary | ICD-10-CM

## 2017-06-30 LAB — CBC WITH DIFFERENTIAL/PLATELET
BASO%: 0.2 % (ref 0.0–2.0)
Basophils Absolute: 0 10*3/uL (ref 0.0–0.1)
EOS ABS: 0.1 10*3/uL (ref 0.0–0.5)
EOS%: 1.1 % (ref 0.0–7.0)
HCT: 33 % — ABNORMAL LOW (ref 34.8–46.6)
HEMOGLOBIN: 10.6 g/dL — AB (ref 11.6–15.9)
LYMPH%: 28.8 % (ref 14.0–49.7)
MCH: 30.8 pg (ref 25.1–34.0)
MCHC: 32.1 g/dL (ref 31.5–36.0)
MCV: 95.9 fL (ref 79.5–101.0)
MONO#: 0.4 10*3/uL (ref 0.1–0.9)
MONO%: 9.2 % (ref 0.0–14.0)
NEUT%: 60.7 % (ref 38.4–76.8)
NEUTROS ABS: 2.7 10*3/uL (ref 1.5–6.5)
Platelets: 114 10*3/uL — ABNORMAL LOW (ref 145–400)
RBC: 3.44 10*6/uL — AB (ref 3.70–5.45)
RDW: 17.5 % — AB (ref 11.2–14.5)
WBC: 4.4 10*3/uL (ref 3.9–10.3)
lymph#: 1.3 10*3/uL (ref 0.9–3.3)

## 2017-06-30 LAB — COMPREHENSIVE METABOLIC PANEL
ALBUMIN: 3.7 g/dL (ref 3.5–5.0)
ALK PHOS: 73 U/L (ref 40–150)
ALT: 17 U/L (ref 0–55)
AST: 19 U/L (ref 5–34)
Anion Gap: 8 mEq/L (ref 3–11)
BUN: 13.2 mg/dL (ref 7.0–26.0)
CO2: 25 mEq/L (ref 22–29)
CREATININE: 0.9 mg/dL (ref 0.6–1.1)
Calcium: 9.5 mg/dL (ref 8.4–10.4)
Chloride: 107 mEq/L (ref 98–109)
EGFR: 65 mL/min/{1.73_m2} — AB (ref >90)
GLUCOSE: 104 mg/dL (ref 70–140)
Potassium: 4.1 mEq/L (ref 3.5–5.1)
Sodium: 140 mEq/L (ref 136–145)
TOTAL PROTEIN: 6.6 g/dL (ref 6.4–8.3)
Total Bilirubin: 0.44 mg/dL (ref 0.20–1.20)

## 2017-06-30 NOTE — Progress Notes (Signed)
[facility]  [phone] [phone]     ID: Autumn Frazier DOB: 12-18-1940  MR#: [id number]  NGE#:[id number]  Patient Care Team: Josetta Huddle, MD as PCP - General (Internal Medicine) Rolm Bookbinder, MD as Consulting Physician (General Surgery) Chauncey Cruel, MD as Consulting Physician (Oncology) Eppie Gibson, MD as Attending Physician (Radiation Oncology) Sylvan Cheese, NP as Nurse Practitioner (Hematology and Oncology) PCP: Henrine Screws, MD GYN: OTHER MD:   CHIEF COMPLAINT: Estrogen receptor positive breast cancer; endometrial cancer  CURRENT TREATMENT: anastrozole   BREAST CANCER HISTORY: From the original intake note:   "Autumn Frazier" had routine screening mammography with tomography of the [facility] 10/31/2015. A possible mass associated with calcifications was noted in the left breast. Accordingly the patient proceeded to digital diagnostic left mammography and ultrasonography the same day. Breast density was category B. In the left breast that was a small ill-defined mass at approximately 4:00 measuring 7 mm mammographically. Also there was a small group of heterogeneous calcifications in a linear configuration in the upper outer quadrant of the left breast, at the 12:30 o'clock position. The calcifications spanned a 0.4 cm. There were approximately 3 cm superior to the mass. There was a second group of heterogeneous calcifications approximately 1 cm posterior and lateral to the mass. These spanned 1.1 cm. The distance between both groups of calcifications (with a mass in between) was 4.5 cm.  I examined her was no palpable abnormality. By ultrasonography there was a small irregular hypoechoic mass at the 3:30 o'clock position of the left breast measuring 0.6 cm maximally. Ultrasound of the axilla was unremarkable.  On the same day the patient underwent biopsy of the left breast mass in question and also the calcifications at the  12:30 o'clock position. The breast mass ([id number]) proved to be an invasive ductal carcinoma, grade 1, estrogen receptor 100% positive, progesterone receptor 30% positive, both with strong staining intensity, with an MIB-1 of 10%, and no HER-2/neu amplification, the signals ratio being 1.50 and the number Pur[place] 2.25.  The area of calcifications at 12:30 o'clock showed usual ductal hyperplasia without atypia.  On 11/06/2015 Autumn Frazier underwent biopsy of a second area of calcifications less than 2 cm away from the primary mass and this showed (S [id number]) atypical ductal hyperplasia.  Subsequent history is as detailed below.  INTERVAL HISTORY: Autumn Frazier returns today for follow-up of her estrogen receptor positive breast cancer. She continues on anastrozole, with remarkably good tolerance. Hot flashes and vaginal dryness are not a major issue. She never developed the arthralgias or myalgias that many patients can experience on this medication. She obtains it at a good price.  However since her last visit here she had some spotting, brought this to medical attention, and had an endometrial biopsy 03/11/2017 which was nondiagnostic, but transvaginal ultrasound at the same time showed a fluid filled endometrial cavity and a 3.6 cm hyperechoic mass on the posterior wall. There was also a separate 1.17 m hyperechoic mass on the anterior wall. Neither of these were vascular. Altogether the endometrium measured 4.35 cm. There was also a questionable cervical mass.  The patient was referred to Dr. Polly Cobia and after appropriate discussion underwent robotic hysterectomy with bilateral salpingo-oophorectomy and sentinel lymph node sampling on 04/01/2017. The final pathology (S and [id number]) showed 2 areas of high-grade endometrial adenocarcinoma, serous type, measuring 2.5 and 1.5 cm, with no definitive myometrial invasion seen. The ovaries and fallopian tubes were benign. However 2 right obturator lymph nodes  and 2 left  obturator lymph nodes were all involved by disease.  Additional tests on the tumor tissue found retained nuclear expression for all 4 mismatch repair proteins tested (ML H1, PMS 2, [place] 2, MSH 6) and estrogen receptor positivity in the week intermediate range at 90%, with less than 1% progesterone receptor, but HER-2/neu 3+ by immunohistochemistry.  She is being key dated with paclitaxel, carboplatin, and trastuzumab, started 04/24/2017, with 3 cycles given before radiation, and further treatment to follow radiation. She has completed the preradiation face and will be seeing Dr. Sondra Come here later this week to get the radiation portion going.  REVIEW OF SYSTEMS: Autumn Frazier tolerated her chemotherapy remarkably well. She does feel very tired the first week, pretty much stays in bed a lot, has some nausea, and some joint pain. Of course she lost her hair. By the second week she is feeling better. She reports that she ambulates as her form of exercise. She states that she plays Bridge with friends as well. She denies unusual headaches, visual changes, vomiting, or dizziness. There has been no unusual cough, phlegm production, or pleurisy. This been no change in bowel or bladder habits. She denies unexplained fatigue or unexplained weight loss, bleeding, rash, or fever. A detailed review of systems was otherwise negative.   PAST MEDICAL HISTORY: Past Medical History:  Diagnosis Date  . Breast cancer ([place])   . Breast cancer of lower-outer quadrant of left female breast ([facility]) 11/02/2015  . Hx of radiation therapy 01/09/16- 02/06/16   Left Breast    PAST SURGICAL HISTORY: Past Surgical History:  Procedure Laterality Date  . BREAST BIOPSY Left 11/06/2015   [place]  . BREAST LUMPECTOMY Left   . herniated disc    . RADIOACTIVE SEED GUIDED MASTECTOMY WITH AXILLARY SENTINEL LYMPH NODE BIOPSY Left 11/27/2015   Procedure: LEFT BREAST SEED AND WIRE GUIDED LUMPECTOMY WITH LEFT AXILLARY LYMPH NODE BIOPSY;  Surgeon: Rolm Bookbinder, MD;  Location: [facility];  Service: General;  Laterality: Left;  . TONSILLECTOMY     FAMILY HISTORY Family History  Problem Relation Age of Onset  . Brain cancer Sister   . Ovarian cancer Maternal Aunt     the patient's father died from heart disease at the age of 23. The patient's mother died from heart failure at the age of 7. The patient had one brother who was shot in an accident at the age of 22. She has 1 sister. On the mother's side and aunt at age 42 was diagnosed with ovarian cancer. The patient's own sister was diagnosed with a brain tumor at the age of 41 and died 68 years later.  GYNECOLOGIC HISTORY:  No LMP recorded. Patient is postmenopausal. Menarche age 58, first live birth age 53, the patient is [place] P3. She stopped having periods in 1997. She never used hormone replacement. She never used oral contraceptives.  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 Her husband Silvestre Moment worked in [employer] fundraiser for many years and then started the [employer] here in town. He is now retired. Daughter LANDYN LORINCZ is a homemaker in [place]. Son TANZANIA BASHAM the third lives in [place] were he works in [place] for [employer]. Daughter Blane Ohara lives in a plan of where she works as an Immunologist. The patient has 10 grandchildren (7 blood and 3 step). She attends [employer]   HEALTH MAINTENANCE: Social History  Social History  . Marital status: Married    Spouse name: N/A  . Number of children: N/A  . Years of education: N/A   Social History Main Topics  . Smoking status: Former Research scientist (life sciences)  . Smokeless tobacco: Not on file  . Alcohol use 1.8 oz/week    3 Glasses of wine per week  . Drug use: No  . Sexual activity: Not on file   Other Topics Concern  . Not on file   Social History Narrative  . No narrative on file    Colonoscopy: 2011?  PAP:  Bone  density: remote  Lipid panel:  No Known Allergies  Current Outpatient Prescriptions  Medication Sig Dispense Refill  . anastrozole (ARIMIDEX) 1 MG tablet TAKE 1 TABLET(1 MG) BY MOUTH DAILY 90 tablet 0  . aspirin 81 MG tablet Take 81 mg by mouth daily.    Marland Kitchen b complex vitamins tablet Take 1 tablet by mouth daily.    . Biotin 5000 MCG TABS Take by mouth.    . cyclobenzaprine (FLEXERIL) 10 MG tablet Take 1 tablet (10 mg total) by mouth 2 (two) times daily as needed for muscle spasms. 20 tablet 0  . escitalopram (LEXAPRO) 10 MG tablet Take by mouth.    . Fluticasone-Salmeterol (ADVAIR) 100-50 MCG/DOSE AEPB Inhale 1 puff into the lungs every 12 (twelve) hours. 60 each 1  . gabapentin (NEURONTIN) 300 MG capsule TK ONE C PO  HS.  2  . Levothyroxine Sodium 88 MCG CAPS Patient is taking 29mg daily  3  . Vitamin D, Ergocalciferol, (DRISDOL) 50000 units CAPS capsule Take 50,000 Units by mouth every 7 (seven) days.     No current facility-administered medications for this visit.     Objective: Middle-aged white woman Who appears stated age  V76   06/30/17 1105  BP: (!) 154/103  Pulse: 99  Resp: 17  Temp: 98.5 F (36.9 C)  SpO2: 98%   Body mass index is 26.12 kg/m.  . Wt Readings from Last 3 Encounters:  06/30/17 164 lb 4.8 oz (74.5 kg)  06/24/16 179 lb 6.4 oz (81.4 kg)  03/20/16 177 lb 12.8 oz (80.6 kg)   ECOG: 1 - Symptomatic but completely ambulatory  Sclerae unicteric, EOMs intact Oropharynx clear and moist No cervical or supraclavicular adenopathy Lungs no rales or rhonchi Heart regular rate and rhythm Abd soft, nontender, positive bowel sounds, no masses palpated MSK no focal spinal tenderness, no upper extremity lymphedema Neuro: nonfocal, well oriented, appropriate affect Breasts: The right breast is benign. The left breast is undergone lumpectomy and radiation with no evidence of local recurrence. Both axillae are benign.   LAB RESULTS:  CMP  Recent Results  (from the past 2160 hour(s))  CBC with Differential/Platelet     Status: Abnormal   Collection Time: 06/30/17 10:48 AM  Result Value Ref Range   WBC 4.4 3.9 - 10.3 10e3/uL   NEUT# 2.7 1.5 - 6.5 10e3/uL   HGB 10.6 (L) 11.6 - 15.9 g/dL   HCT 33.0 (L) 34.8 - 46.6 %   Platelets 114 (L) 145 - 400 10e3/uL   MCV 95.9 79.5 - 101.0 fL   MCH 30.8 25.1 - 34.0 pg   MCHC 32.1 31.5 - 36.0 g/dL   RBC 3.44 (L) 3.70 - 5.45 10e6/uL   RDW 17.5 (H) 11.2 - 14.5 %   lymph# 1.3 0.9 - 3.3 10e3/uL   MONO# 0.4 0.1 - 0.9 10e3/uL   Eosinophils Absolute 0.1 0.0 - 0.5 10e3/uL  Basophils Absolute 0.0 0.0 - 0.1 10e3/uL   NEUT% 60.7 38.4 - 76.8 %   LYMPH% 28.8 14.0 - 49.7 %   MONO% 9.2 0.0 - 14.0 %   EOS% 1.1 0.0 - 7.0 %   BASO% 0.2 0.0 - 2.0 %  Comprehensive metabolic panel     Status: Abnormal   Collection Time: 06/30/17 10:48 AM  Result Value Ref Range   Sodium 140 136 - 145 mEq/L   Potassium 4.1 3.5 - 5.1 mEq/L   Chloride 107 98 - 109 mEq/L   CO2 25 22 - 29 mEq/L   Glucose 104 70 - 140 mg/dl    Comment: Glucose reference range is for nonfasting patients. Fasting glucose reference range is 70- 100.   BUN 13.2 7.0 - 26.0 mg/dL   Creatinine 0.9 0.6 - 1.1 mg/dL   Total Bilirubin 0.44 0.20 - 1.20 mg/dL   Alkaline Phosphatase 73 40 - 150 U/L   AST 19 5 - 34 U/L   ALT 17 0 - 55 U/L   Total Protein 6.6 6.4 - 8.3 g/dL   Albumin 3.7 3.5 - 5.0 g/dL   Calcium 9.5 8.4 - 10.4 mg/dL   Anion Gap 8 3 - 11 mEq/L   EGFR 65 (L) >90 ml/min/1.73 m2    Comment: eGFR is calculated using the CKD-EPI Creatinine Equation (2009)    No results found for: TOTALPROTELP, ALBUMINELP, A1GS, A2GS, BETS, BETA2SER, GAMS, MSPIKE, SPEI  No results found for: TOTALPROTELP, ALBUMINELP, A2GS, BETS, BETA2SER, GAMS, MSPIKE, SPEI   No results found for: LABCA2   No components found for: OZDGU440  No results for input(s): INR in the last 168 hours.  Urinalysis    Component Value Date/Time   COLORURINE RED BIOCHEMICALS MAY BE  AFFECTED BY COLOR (A) 04/26/2008 0455   APPEARANCEUR CLOUDY (A) 04/26/2008 0455   LABSPEC 1.021 04/26/2008 0455   PHURINE 7.0 04/26/2008 0455   GLUCOSEU NEGATIVE 04/26/2008 0455   HGBUR LARGE (A) 04/26/2008 0455   BILIRUBINUR NEGATIVE 04/26/2008 0455   KETONESUR 15 (A) 04/26/2008 0455   PROTEINUR 30 (A) 04/26/2008 0455   UROBILINOGEN 1.0 04/26/2008 0455   NITRITE NEGATIVE 04/26/2008 0455   LEUKOCYTESUR MODERATE (A) 04/26/2008 0455     STUDIES: Outside CT scan of the abdomen and pelvis obtained 05/09/2017 showed no evidence of metastatic disease.  ASSESSMENT: 76 y.o.  woman status post left breast lower outer quadrant biopsy 10/31/2015 for a clinical T1b N0, stage IA  invasive ductal carcinoma, grade 1, estrogen and progesterone receptor positive, HER-2 nonamplified, with an MIB-1 of 10%  (a) biopsy of an area of calcifications 10/31/2015 showed usual ductal hyperplasia  (b) biopsy of a second area of calcifications 1.8 cm from the breast mass 11/06/2015 showed atypical ductal hyperplasia  (1) status post left lumpectomy 11/27/2015 for a pT2 cN0, stage IIA invasive ductal carcinoma, grade 1, repeat HER-2 again negative.  (2) adjuvant radiation 01-09-16 to 02-06-16: Left Breast / 40.05 Gy in 15 fractions, Left Breast Boost / 10 Gy in 5 fractions  (3) anastrozole started 03/18/2016  (a) bone density 12/04/2015 shows a T score of -1.9  (4) the patient opted against genetic testing  (5) endometrial carcinoma, high grade,  pT1a pN1, status post hysterectomy with bilateral salpingo-oophorectomy 04/01/2017  (a) adjuvant chemotherapy with carboplatin, paclitaxel, and trastuzumab started 04/24/2017  (b) adjuvant radiation pending  PLAN: Autumn Frazier  is now about a year and a half out from definitive surgery for her breast cancer with no evidence of disease  recurrence. This is favorable.  She is tolerating anastrozole remarkably well and the plan will be to continue that for a minimum  of 5 years.  I reassured her that anastrozole, unlike tamoxifen, is not associated with the development of endometrial cancer.  We discussed the fact that she does qualify for genetics testing. At this point she prefers to demur.  She is being followed closely by GYN ONC and radiation. I'm going to see her again in one year. Of course she will have her mammography and a repeat bone density before that visit  She knows to call for any other issues that may develop before then.  Chauncey Cruel, MD  06/30/17 1:10 PM  Medical Oncology and Hematology [facility] [address] [place], [place] [address] Tel. [phone]    Fax. [phone]  This document serves as a record of services personally performed by Lurline Del, MD. It was created on her behalf by Steva Colder, a trained medical scribe. The creation of this record is based on the scribe's personal observations and the provider's statements to them. This document has been checked and approved by the attending provider.

## 2017-06-30 NOTE — Telephone Encounter (Signed)
Mailed schedule for October 2019. Per patient request schedule mailed.

## 2017-07-01 ENCOUNTER — Encounter: Payer: Self-pay | Admitting: Radiation Oncology

## 2017-07-02 ENCOUNTER — Ambulatory Visit: Payer: Medicare HMO

## 2017-07-02 ENCOUNTER — Ambulatory Visit
Admission: RE | Admit: 2017-07-02 | Discharge: 2017-07-02 | Disposition: A | Discharge status: Home / Self Care | Payer: Medicare HMO | Source: Ambulatory Visit | Attending: Radiation Oncology | Admitting: Radiation Oncology

## 2017-07-02 ENCOUNTER — Encounter: Payer: Self-pay | Admitting: Radiation Oncology

## 2017-07-02 ENCOUNTER — Ambulatory Visit
Admission: RE | Admit: 2017-07-02 | Discharge: 2017-07-02 | Disposition: A | Discharge status: Home / Self Care | Payer: Commercial Managed Care - HMO | Source: Ambulatory Visit | Attending: Radiation Oncology | Admitting: Radiation Oncology

## 2017-07-02 DIAGNOSIS — Z923 Personal history of irradiation: Secondary | ICD-10-CM | POA: Diagnosis not present

## 2017-07-02 DIAGNOSIS — Z87891 Personal history of nicotine dependence: Secondary | ICD-10-CM | POA: Insufficient documentation

## 2017-07-02 DIAGNOSIS — Z8542 Personal history of malignant neoplasm of other parts of uterus: Secondary | ICD-10-CM | POA: Insufficient documentation

## 2017-07-02 DIAGNOSIS — N950 Postmenopausal bleeding: Secondary | ICD-10-CM | POA: Insufficient documentation

## 2017-07-02 DIAGNOSIS — Z9012 Acquired absence of left breast and nipple: Secondary | ICD-10-CM | POA: Diagnosis not present

## 2017-07-02 DIAGNOSIS — C50512 Malignant neoplasm of lower-outer quadrant of left female breast: Secondary | ICD-10-CM | POA: Diagnosis not present

## 2017-07-02 DIAGNOSIS — Z79899 Other long term (current) drug therapy: Secondary | ICD-10-CM | POA: Diagnosis not present

## 2017-07-02 DIAGNOSIS — Z853 Personal history of malignant neoplasm of breast: Secondary | ICD-10-CM | POA: Diagnosis not present

## 2017-07-02 DIAGNOSIS — Z8041 Family history of malignant neoplasm of ovary: Secondary | ICD-10-CM | POA: Diagnosis not present

## 2017-07-02 DIAGNOSIS — Z808 Family history of malignant neoplasm of other organs or systems: Secondary | ICD-10-CM | POA: Insufficient documentation

## 2017-07-02 DIAGNOSIS — Z7982 Long term (current) use of aspirin: Secondary | ICD-10-CM | POA: Insufficient documentation

## 2017-07-02 DIAGNOSIS — Z79811 Long term (current) use of aromatase inhibitors: Secondary | ICD-10-CM | POA: Diagnosis not present

## 2017-07-02 DIAGNOSIS — Z510 Encounter for antineoplastic radiation therapy: Secondary | ICD-10-CM | POA: Diagnosis not present

## 2017-07-02 DIAGNOSIS — Z90710 Acquired absence of both cervix and uterus: Secondary | ICD-10-CM | POA: Insufficient documentation

## 2017-07-02 DIAGNOSIS — G620 Drug-induced polyneuropathy: Secondary | ICD-10-CM | POA: Diagnosis not present

## 2017-07-02 DIAGNOSIS — C541 Malignant neoplasm of endometrium: Secondary | ICD-10-CM

## 2017-07-02 DIAGNOSIS — Z171 Estrogen receptor negative status [ER-]: Secondary | ICD-10-CM | POA: Diagnosis not present

## 2017-07-02 HISTORY — DX: Malignant neoplasm of endometrium: C54.1

## 2017-07-02 NOTE — Progress Notes (Signed)
Has armband been applied?    Does patient have an allergy to IV contrast dye?: No.   Has patient ever received premedication for IV contrast dye?: No.   Does patient take metformin?: No.  If patient does take metformin when was the last dose: n/a  Date of lab work: June 30, 2017 BUN: 13.2  CR: 0.9  IV site: right chest porta cath accessed.  Blood return noted.  Has IV site been added to flowsheet?  Yes.    BP 133/87   Pulse (!) 101   Temp 98.5 F (36.9 C)   Wt 160 lb (72.6 kg)   SpO2 98% Comment: room air  BMI 25.44 kg/m

## 2017-07-02 NOTE — Progress Notes (Signed)
GYN Location of Tumor / Histology: endometrial cancer  Autumn Frazier presented with symptoms of: post menopausal bleeding  Biopsies revealed:   04/01/17  The endometrial adenocarcinoma was analyzed by [facility] for DNA mismatch repair proteins.  The neoplasm retained nuclear expression of all four mismatch repair proteins, MLH1, PMS2, MSH2, MSH6.    Estrogen receptors (ER): Weak-intermediate, >90% Progesterone receptors (PR): Intermediate, <1% Her2/neu: 3+ by immunohistochemistry.  Controls worked appropriately.   Final Diagnosis 1.     Endometrium, curettage:             High-grade endometrial adenocarcinoma, consistent with serous type.  2.     Uterus with bilateral ovaries and fallopian tubes; hysterectomy and bilateral salpingo-oophorectomy:             Chronic cervicitis with atrophic changes.             High-grade endometrial adenocarcinoma, consistent with serous type.             See synoptic report.             No definite invasion into myometrium is seen.             Benign bilateral ovaries and fallopian tubes.  3.     Sentinel lymph nodes, right obturator region, biopsy:             Metastatic high-grade endometrioid adenocarcinoma to two of two (2 of 2) lymph nodes.  4.     Sentinel lymph nodes, left obturator region, biopsy:             Metastatic high-grade endometrial adenocarcinoma to two of two (2 of 2) lymph nodes.    Past/Anticipated interventions by Gyn/Onc surgery, if any: 04/01/17 - ROBOTIC HYSTERECTOMY WITH BSO, SENTINEL NODE MAPPING AND D&C by Dr. Polly Cobia.  Past/Anticipated interventions by medical oncology, if any: adjuvant treatment and began cycle #1 of Taxol, carboplatin, and Herceptin on April 24, 2017. She completed cycle #3 of this regimen on June 05, 2017. At this time, she will take a break from the carboplatin and Taxol, but will continue with Herceptin every 3 weeks during radiation. Upon completion of her radiation, we will then proceed back with 3  additional rounds of Taxol carbo and Herceptin in, if she is continuing to do well, then proceed on with Herceptin maintenance therapy.   Weight changes, if any: has lost about 12 lbs during chemotherapy  Bowel/Bladder complaints, if any: no  Nausea/Vomiting, if any: no  Pain issues, if any:  no  SAFETY ISSUES:  Prior radiation? Yes - 01/09/16- 02/06/16 left breast by Dr. Isidore Moos.  Pacemaker/ICD? no  Possible current pregnancy? no  Is the patient on methotrexate? no  Current Complaints / other details:  Patient is here with her husband.  BP (!) 146/100 (BP Location: Right Arm, Patient Position: Sitting)   Pulse 88   Temp 98.4 F (36.9 C) (Oral)   Ht 5' 6.5" (1.689 m)   Wt 162 lb 9.6 oz (73.8 kg)   SpO2 98%   BMI 25.85 kg/m    Wt Readings from Last 3 Encounters:  07/02/17 162 lb 9.6 oz (73.8 kg)  06/30/17 164 lb 4.8 oz (74.5 kg)  06/24/16 179 lb 6.4 oz (81.4 kg)

## 2017-07-02 NOTE — Progress Notes (Signed)
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 female who was referred to [place] by [facility] for evaluation of endometrial cancer. She presents today with her husband to discuss the role of radiation therapy in the treatment of her disease.    Initially, the pt presented to her PCP's office for postmenopausal bleeding. This was around the time of mid-June. She complained of a three week history of spotting and "watery" discharge. She underwent endometrial biopsy on 03/11/17 which demonstrated a very scant atypical epithelium. Vaginal [place] was obtained which showed an endometrial cavity with fluid and 3.6x2.3cm hyperechoic mass on the posterior wall and a 1.1x1cm hyperechoic mass on the anterior wall. Both of the masses were visualized as avascular. The endometrium measured at 4.35cm. Additionally, a questionable cervical mass of 2.3x1.5cm was also seen. She denies any pelvic pain prior to her diagnosis.   Following this, she was seen by Dr Polly Cobia of Gynecological Oncology at [facility] who recommended robotic hysterectomy with bilateral salpingo-oophorectomy and sentinel node biopsies. This occurred on 04/01/17 and a D&C was performed just prior to beginning the hysterectomy. Surgical pathology from this resulted in identification of high-grade endometrial adenocarcinoma, consistent with serous type. Metastatic disease was observed in 4 of 4 lymph nodes surveyed along both the left and right obturator region. Histological assay revealed that her carcinoma was ER(+) weak-intermediate at >90% and PR(+) intermediate at <1%. The endometrial cancer also showed  HER-2/neu positivity 3+ by immunohistochemistry.  No definitive myometrial invasion was seen. The ovary and fallopian tubes were benign.   She denies any leg swelling or perivaginal or perirectal numbness following her surgery. She reports that she recovered well from this surgery overall.   She was then given paclitaxel, carboplatin, and trastuzumab, started 04/24/2017, with 3 cycles given in total prior to beginning radiation therapy. She received her last dosage of chemotherapy on 06/26/17; she has recovered from this very well and she notes that her symptoms including neuropathy have significantly been improving over the past several days. She denies any chemotherapy-related headaches.   Of note, she also has a h/o breast cancer of the LOQ, ER(+) 100%, PR(+) 30%, both with strong staining intensity, HER-2(-). This was first seen on screening mammography on 10/30/16. To date, she has underwent lumpectomy with surgical pathology staging at pT2 cN0, stage IIA, grade I. She underwent adjuvant radiation from 01/09/16 to 02/06/16 of the left breast receiving 40.05 Gy in 15 fractions initially and boost of the left breast receiving 10 Gy in 5 fractions. Pt has maintained f/u w/ Dr Jana Hakim routinely, last being seen on 06/30/17. She is continuing to take Anastrozole with great tolerance.   Overall, she is doing well and in great spirits ready to begin her adjuvant radiation therapy. 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 She denies headaches, leg swelling, vaginal/suprapubic pain, or any other associated symptoms/complaints.   PREVIOUS RADIATION THERAPY: Yes, she underwent adjuvant radiation from 01/09/16 to 02/06/16 of the left breast receiving 40.05 Gy in 15 fractions initially and boost of the left breast receiving 10 Gy in 5 fractions. (  Dr Eppie Gibson)  PAST MEDICAL HISTORY:  has a past medical history of Breast  cancer ([place]); Breast cancer of lower-outer quadrant of left female breast ([place]) (11/02/2015); Endometrial cancer [facility]); and radiation therapy (01/09/16- 02/06/16).    PAST SURGICAL HISTORY: Past Surgical History:  Procedure Laterality Date  . BREAST BIOPSY Left 11/06/2015   [place]  . BREAST LUMPECTOMY Left   . herniated disc    . RADIOACTIVE SEED GUIDED MASTECTOMY WITH AXILLARY SENTINEL LYMPH NODE BIOPSY Left 11/27/2015   Procedure: LEFT BREAST SEED AND WIRE GUIDED LUMPECTOMY WITH LEFT AXILLARY LYMPH NODE BIOPSY;  Surgeon: Rolm Bookbinder, MD;  Location: [facility];  Service: General;  Laterality: Left;  . ROBOTIC ASSISTED TOTAL HYSTERECTOMY WITH BILATERAL SALPINGO OOPHERECTOMY  04/01/2017   ROBOTIC HYSTERECTOMY WITH BSO, SENTINEL NODE MAPPING AND D&C by Dr. Polly Cobia  . TONSILLECTOMY      FAMILY HISTORY: family history includes Brain cancer in her sister; Ovarian cancer in her maternal aunt.  SOCIAL HISTORY:  reports that she quit smoking about 35 years ago. Her smoking use included Cigarettes. She has never used smokeless tobacco. She reports that she drinks about 1.8 oz of alcohol per week . She reports that she does not use drugs.  ALLERGIES: Patient has no known allergies.  MEDICATIONS:  Current Outpatient Prescriptions  Medication Sig Dispense Refill  . acetaminophen (TYLENOL) 500 MG tablet Take 500 mg by mouth every 6 (six) hours as needed.    Marland Kitchen anastrozole (ARIMIDEX) 1 MG tablet TAKE 1 TABLET(1 MG) BY MOUTH DAILY 90 tablet 0  . aspirin 81 MG tablet Take 81 mg by mouth daily.    Marland Kitchen b complex vitamins tablet Take 1 tablet by mouth daily.    . Fluticasone-Salmeterol (ADVAIR) 100-50 MCG/DOSE AEPB Inhale 1 puff into the lungs every 12 (twelve) hours. 60 each 1  . gabapentin (NEURONTIN) 300 MG capsule TK ONE C PO  HS.  2  . Levothyroxine Sodium 88 MCG CAPS Patient is taking 13mg daily  3  . Vitamin D, Ergocalciferol, (DRISDOL) 50000 units CAPS capsule Take 50,000 Units by mouth  every 7 (seven) days.    . Biotin 5000 MCG TABS Take by mouth.    . cyclobenzaprine (FLEXERIL) 10 MG tablet Take 1 tablet (10 mg total) by mouth 2 (two) times daily as needed for muscle spasms. (Patient not taking: Reported on 07/02/2017) 20 tablet 0  . escitalopram (LEXAPRO) 10 MG tablet Take by mouth.     No current facility-administered medications for this encounter.     REVIEW OF SYSTEMS:  REVIEW OF SYSTEMS: A 10+ POINT REVIEW OF SYSTEMS WAS OBTAINED including neurology, dermatology, psychiatry, cardiac, respiratory, lymph, extremities, GI, GU, musculoskeletal, constitutional, reproductive, HEENT. All pertinent positives are noted in the HPI. All others are negative.   PHYSICAL EXAM:  height is 5' 6.5" (1.689 m) and weight is 162 lb 9.6 oz (73.8 kg). Her oral temperature is 98.4 F (36.9 C). Her blood pressure is 146/100 (abnormal) and her pulse is 88. Her oxygen saturation is 98%.   General: Alert and oriented, in no acute distress HEENT: Head is normocephalic. Extraocular movements are intact. Oropharynx is clear. Neck: Neck is supple, no palpable cervical or supraclavicular lymphadenopathy. Heart: Regular in rate and rhythm with no murmurs, rubs, or gallops. Chest: Clear to auscultation bilaterally, with no rhonchi, wheezes, or rales. Abdomen: Soft, nontender, nondistended, with no rigidity or guarding. Small scars from laparoscopic procedure, all healed well Extremities: No cyanosis or edema. Lymphatics: see Neck  Exam Skin: No concerning lesions. Musculoskeletal: symmetric strength and muscle tone throughout. Neurologic: Cranial nerves II through XII are grossly intact. No obvious focalities. Speech is fluent. Coordination is intact. Psychiatric: Judgment and insight are intact. Affect is appropriate. Pelvic: Pelvic exam deferred to simulation and planning day at pt's request.   ECOG = 1  LABORATORY DATA:  Lab Results  Component Value Date   WBC 4.4 06/30/2017   HGB 10.6 (L)  06/30/2017   HCT 33.0 (L) 06/30/2017   MCV 95.9 06/30/2017   PLT 114 (L) 06/30/2017   NEUTROABS 2.7 06/30/2017   Lab Results  Component Value Date   NA 140 06/30/2017   K 4.1 06/30/2017   CL 106 02/23/2012   CO2 25 06/30/2017   GLUCOSE 104 06/30/2017   CREATININE 0.9 06/30/2017   CALCIUM 9.5 06/30/2017    RADIOGRAPHY: No results found.    IMPRESSION: OLIVIANA MCGAHEE is a wonderful 76 y.o. female who was referred to [place] by Dr Polly Cobia of [facility] to discuss the potential role of radiation therapy in the management of her advanced, high-grade endometrial cancer.   I discussed with the patient and her husband the potential side effects and toxicities of radiation therapy including mild pain with bowel movements, diarrhea, irritation, urinary urgency, dysuria, and radiation-related skin changes to the perirectal area over her field of treatment. She would like to proceed with treatment at this time. We also discussed symptom management should she develop side effects during her course of treatment.   Pt would be at risk with recurrence in the pelvis and I would agree with Dr Shelba Flake recommendation for adjuvant pelvic radiation therapy. Given the findings of high grade serous histology and lymphovascular space invasion I would also recommend high-dose brachytherapy directed at the proximal vagina as part of her radiation treatment. The pt would be able to resume her chemotherapy during her three vaginal brachytherapy sessions as desired. (pending Dr. Shelba Flake approval)  She will undergo CT Simulation and planning tomorrow at 3pm with plan to begin her treatments in October 22nd.    PLAN: She will undergo CT Simulation and planning tomorrow at 3pm with plan to begin her treatments in October 22nd. I recommend intensity-modulated radiation therapy (IMRT) for her external beam radiation therapy to limit dose to small bowel.   I spent 55 minutes minutes face to face with the patient and more than 50%  of that time was spent in counseling and/or coordination of care.   ------------------------------------------------  Blair Promise, PhD, MD   This document serves as a record of services personally performed by Gery Pray, MD. It was created on his behalf by Reola Mosher, a trained medical scribe. The creation of this record is based on the scribe's personal observations and the provider's statements to them. This document has been checked and approved by the attending provider.

## 2017-07-03 ENCOUNTER — Ambulatory Visit
Admission: RE | Admit: 2017-07-03 | Discharge: 2017-07-03 | Disposition: A | Discharge status: Home / Self Care | Payer: Medicare HMO | Source: Ambulatory Visit | Attending: Radiation Oncology | Admitting: Radiation Oncology

## 2017-07-03 VITALS — BP 133/87 | HR 101 | Temp 98.5°F | Wt 160.0 lb

## 2017-07-03 DIAGNOSIS — Z90710 Acquired absence of both cervix and uterus: Secondary | ICD-10-CM | POA: Diagnosis not present

## 2017-07-03 DIAGNOSIS — Z8542 Personal history of malignant neoplasm of other parts of uterus: Secondary | ICD-10-CM | POA: Diagnosis not present

## 2017-07-03 DIAGNOSIS — C541 Malignant neoplasm of endometrium: Secondary | ICD-10-CM

## 2017-07-03 DIAGNOSIS — Z9012 Acquired absence of left breast and nipple: Secondary | ICD-10-CM | POA: Diagnosis not present

## 2017-07-03 DIAGNOSIS — Z510 Encounter for antineoplastic radiation therapy: Secondary | ICD-10-CM | POA: Diagnosis not present

## 2017-07-03 DIAGNOSIS — N950 Postmenopausal bleeding: Secondary | ICD-10-CM | POA: Diagnosis not present

## 2017-07-03 DIAGNOSIS — Z853 Personal history of malignant neoplasm of breast: Secondary | ICD-10-CM | POA: Diagnosis not present

## 2017-07-03 DIAGNOSIS — C50512 Malignant neoplasm of lower-outer quadrant of left female breast: Secondary | ICD-10-CM | POA: Diagnosis not present

## 2017-07-03 DIAGNOSIS — Z923 Personal history of irradiation: Secondary | ICD-10-CM | POA: Diagnosis not present

## 2017-07-03 DIAGNOSIS — Z8041 Family history of malignant neoplasm of ovary: Secondary | ICD-10-CM | POA: Diagnosis not present

## 2017-07-03 DIAGNOSIS — G620 Drug-induced polyneuropathy: Secondary | ICD-10-CM | POA: Diagnosis not present

## 2017-07-03 MED ORDER — HEPARIN SOD (PORK) LOCK FLUSH 100 UNIT/ML IV SOLN
500.0000 [IU] | Freq: Once | INTRAVENOUS | Status: AC
  Administered 2017-07-03: 500 [IU] via INTRAVENOUS

## 2017-07-03 MED ORDER — SODIUM CHLORIDE 0.9% FLUSH
10.0000 mL | Freq: Once | INTRAVENOUS | Status: AC
  Administered 2017-07-03: 10 mL via INTRAVENOUS

## 2017-07-03 MED ORDER — LIDOCAINE-PRILOCAINE 2.5-2.5 % EX CREA
TOPICAL_CREAM | Freq: Once | CUTANEOUS | Status: AC
  Administered 2017-07-03: 14:00:00 via TOPICAL
  Filled 2017-07-03: qty 5

## 2017-07-03 MED ORDER — SODIUM CHLORIDE 0.9 % IJ SOLN
10.0000 mL | Freq: Once | INTRAMUSCULAR | Status: DC
Start: 2017-07-03 — End: 2017-07-03

## 2017-07-03 NOTE — Progress Notes (Signed)
  Radiation Oncology         (336) [phone] ________________________________  Name: CURTISTINE PETTITT MRN: [id number]  Date: 07/03/2017  DOB: [birth year]  SIMULATION AND TREATMENT PLANNING NOTE    ICD-10-CM   1. 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  Identity was confirmed.  All relevant records and images related to the planned course of therapy were reviewed.  The patient freely provided informed written consent to proceed with treatment after reviewing the details related to the planned course of therapy. The consent form was witnessed and verified by the simulation staff.  Then, the patient was set-up in a stable reproducible supine position for radiation therapy.  CT images were obtained.  Surface markings were placed.  The CT images were loaded into the planning software.  Then the target and avoidance structures were contoured.  Treatment planning then occurred.  The radiation prescription was entered and confirmed. Then, I designed and supervised the construction of a total of 2 medically necessary complex treatment devices.  I have requested : Intensity Modulated Radiotherapy (IMRT) is medically necessary for this case for the following reason:  Small bowel sparing.  I have ordered: Dose calc.   PLAN:  The patient will receive 45 Gy in 25 fractions. She will receive boost to the vaginal cuff with HDR Brachytherapy. She will receive 3 high dose treatments with a dose of 6 Gy to the mucosal surface to a total of 18 Gy.     -----------------------------------  Blair Promise, PhD, MD   This document serves as a record of services personally performed by Gery Pray, MD. It was created on her behalf by Marlowe Kays, a trained medical scribe. The creation of this record is based on the scribe's  personal observations and the provider's statements to them. This document has been checked and approved by the attending provider.

## 2017-07-07 DIAGNOSIS — C541 Malignant neoplasm of endometrium: Secondary | ICD-10-CM | POA: Diagnosis not present

## 2017-07-07 DIAGNOSIS — Z79899 Other long term (current) drug therapy: Secondary | ICD-10-CM | POA: Diagnosis not present

## 2017-07-09 DIAGNOSIS — N950 Postmenopausal bleeding: Secondary | ICD-10-CM | POA: Diagnosis not present

## 2017-07-09 DIAGNOSIS — Z8542 Personal history of malignant neoplasm of other parts of uterus: Secondary | ICD-10-CM | POA: Diagnosis not present

## 2017-07-09 DIAGNOSIS — Z8041 Family history of malignant neoplasm of ovary: Secondary | ICD-10-CM | POA: Diagnosis not present

## 2017-07-09 DIAGNOSIS — Z923 Personal history of irradiation: Secondary | ICD-10-CM | POA: Diagnosis not present

## 2017-07-09 DIAGNOSIS — Z9012 Acquired absence of left breast and nipple: Secondary | ICD-10-CM | POA: Diagnosis not present

## 2017-07-09 DIAGNOSIS — Z510 Encounter for antineoplastic radiation therapy: Secondary | ICD-10-CM | POA: Diagnosis not present

## 2017-07-09 DIAGNOSIS — C50512 Malignant neoplasm of lower-outer quadrant of left female breast: Secondary | ICD-10-CM | POA: Diagnosis not present

## 2017-07-09 DIAGNOSIS — Z90710 Acquired absence of both cervix and uterus: Secondary | ICD-10-CM | POA: Diagnosis not present

## 2017-07-09 DIAGNOSIS — G620 Drug-induced polyneuropathy: Secondary | ICD-10-CM | POA: Diagnosis not present

## 2017-07-09 DIAGNOSIS — C541 Malignant neoplasm of endometrium: Secondary | ICD-10-CM | POA: Diagnosis not present

## 2017-07-09 DIAGNOSIS — Z853 Personal history of malignant neoplasm of breast: Secondary | ICD-10-CM | POA: Diagnosis not present

## 2017-07-10 DIAGNOSIS — Z79899 Other long term (current) drug therapy: Secondary | ICD-10-CM | POA: Diagnosis not present

## 2017-07-10 DIAGNOSIS — G629 Polyneuropathy, unspecified: Secondary | ICD-10-CM | POA: Diagnosis not present

## 2017-07-10 DIAGNOSIS — E039 Hypothyroidism, unspecified: Secondary | ICD-10-CM | POA: Diagnosis not present

## 2017-07-10 DIAGNOSIS — Z5111 Encounter for antineoplastic chemotherapy: Secondary | ICD-10-CM | POA: Diagnosis not present

## 2017-07-10 DIAGNOSIS — I10 Essential (primary) hypertension: Secondary | ICD-10-CM | POA: Diagnosis not present

## 2017-07-10 DIAGNOSIS — C541 Malignant neoplasm of endometrium: Secondary | ICD-10-CM | POA: Diagnosis not present

## 2017-07-10 DIAGNOSIS — F329 Major depressive disorder, single episode, unspecified: Secondary | ICD-10-CM | POA: Diagnosis not present

## 2017-07-14 ENCOUNTER — Ambulatory Visit
Admission: RE | Admit: 2017-07-14 | Discharge: 2017-07-14 | Disposition: A | Discharge status: Home / Self Care | Payer: Medicare HMO | Source: Ambulatory Visit | Attending: Radiation Oncology | Admitting: Radiation Oncology

## 2017-07-14 DIAGNOSIS — Z90710 Acquired absence of both cervix and uterus: Secondary | ICD-10-CM | POA: Diagnosis not present

## 2017-07-14 DIAGNOSIS — Z923 Personal history of irradiation: Secondary | ICD-10-CM | POA: Diagnosis not present

## 2017-07-14 DIAGNOSIS — C50512 Malignant neoplasm of lower-outer quadrant of left female breast: Secondary | ICD-10-CM | POA: Diagnosis not present

## 2017-07-14 DIAGNOSIS — C541 Malignant neoplasm of endometrium: Secondary | ICD-10-CM

## 2017-07-14 DIAGNOSIS — Z510 Encounter for antineoplastic radiation therapy: Secondary | ICD-10-CM | POA: Diagnosis not present

## 2017-07-14 DIAGNOSIS — N950 Postmenopausal bleeding: Secondary | ICD-10-CM | POA: Diagnosis not present

## 2017-07-14 DIAGNOSIS — Z8041 Family history of malignant neoplasm of ovary: Secondary | ICD-10-CM | POA: Diagnosis not present

## 2017-07-14 DIAGNOSIS — Z9012 Acquired absence of left breast and nipple: Secondary | ICD-10-CM | POA: Diagnosis not present

## 2017-07-14 DIAGNOSIS — Z8542 Personal history of malignant neoplasm of other parts of uterus: Secondary | ICD-10-CM | POA: Diagnosis not present

## 2017-07-14 DIAGNOSIS — Z853 Personal history of malignant neoplasm of breast: Secondary | ICD-10-CM | POA: Diagnosis not present

## 2017-07-14 DIAGNOSIS — G620 Drug-induced polyneuropathy: Secondary | ICD-10-CM | POA: Diagnosis not present

## 2017-07-14 NOTE — Progress Notes (Signed)
  Radiation Oncology         (336) [phone] ________________________________  Name: Autumn Frazier MRN: [id number]  Date: 07/14/2017  DOB: [birth year]  Simulation Verification Note    ICD-10-CM   1. Endometrial cancer ([place]) C54.1     Status: outpatient  NARRATIVE: The patient was brought to the treatment unit and placed in the planned treatment position. The clinical setup was verified. Then port films were obtained and uploaded to the radiation oncology medical record software.  The treatment beams were carefully compared against the planned radiation fields. The position location and shape of the radiation fields was reviewed. They targeted volume of tissue appears to be appropriately covered by the radiation beams. Organs at risk appear to be excluded as planned.  Based on my personal review, I approved the simulation verification. The patient's treatment will proceed as planned.  -----------------------------------  Blair Promise, PhD, MD

## 2017-07-15 ENCOUNTER — Ambulatory Visit
Admission: RE | Admit: 2017-07-15 | Discharge: 2017-07-15 | Disposition: A | Discharge status: Home / Self Care | Payer: Medicare HMO | Source: Ambulatory Visit | Attending: Radiation Oncology | Admitting: Radiation Oncology

## 2017-07-15 ENCOUNTER — Other Ambulatory Visit: Payer: Self-pay | Admitting: Radiation Oncology

## 2017-07-15 DIAGNOSIS — C541 Malignant neoplasm of endometrium: Secondary | ICD-10-CM

## 2017-07-15 DIAGNOSIS — C50512 Malignant neoplasm of lower-outer quadrant of left female breast: Secondary | ICD-10-CM | POA: Diagnosis not present

## 2017-07-15 DIAGNOSIS — Z90710 Acquired absence of both cervix and uterus: Secondary | ICD-10-CM | POA: Diagnosis not present

## 2017-07-15 DIAGNOSIS — N950 Postmenopausal bleeding: Secondary | ICD-10-CM | POA: Diagnosis not present

## 2017-07-15 DIAGNOSIS — Z853 Personal history of malignant neoplasm of breast: Secondary | ICD-10-CM | POA: Diagnosis not present

## 2017-07-15 DIAGNOSIS — Z510 Encounter for antineoplastic radiation therapy: Secondary | ICD-10-CM | POA: Diagnosis not present

## 2017-07-15 DIAGNOSIS — Z8542 Personal history of malignant neoplasm of other parts of uterus: Secondary | ICD-10-CM | POA: Diagnosis not present

## 2017-07-15 DIAGNOSIS — Z9012 Acquired absence of left breast and nipple: Secondary | ICD-10-CM | POA: Diagnosis not present

## 2017-07-15 DIAGNOSIS — G620 Drug-induced polyneuropathy: Secondary | ICD-10-CM | POA: Diagnosis not present

## 2017-07-15 DIAGNOSIS — Z8041 Family history of malignant neoplasm of ovary: Secondary | ICD-10-CM | POA: Diagnosis not present

## 2017-07-15 DIAGNOSIS — Z923 Personal history of irradiation: Secondary | ICD-10-CM | POA: Diagnosis not present

## 2017-07-15 MED ORDER — LORAZEPAM 0.5 MG PO TABS: 0.5000 mg | ORAL_TABLET | Freq: Three times a day (TID) | ORAL | 0 refills | Status: DC

## 2017-07-15 NOTE — Progress Notes (Signed)
Pt here for patient teaching.  Pt given Radiation and You booklet. Reviewed areas of pertinence such as diarrhea, fatigue, nausea and vomiting, skin changes and urinary and bladder changes . Pt able to give teach back of to pat skin, use unscented/gentle soap, have Imodium on hand and drink plenty of water,avoid applying anything to skin within 4 hours of treatment. Pt demonstrated understanding and verbalizes understanding of information given and will contact nursing with any questions or concerns.          

## 2017-07-16 ENCOUNTER — Ambulatory Visit
Admission: RE | Admit: 2017-07-16 | Discharge: 2017-07-16 | Disposition: A | Discharge status: Home / Self Care | Payer: Medicare HMO | Source: Ambulatory Visit | Attending: Radiation Oncology | Admitting: Radiation Oncology

## 2017-07-16 DIAGNOSIS — Z9012 Acquired absence of left breast and nipple: Secondary | ICD-10-CM | POA: Diagnosis not present

## 2017-07-16 DIAGNOSIS — G620 Drug-induced polyneuropathy: Secondary | ICD-10-CM | POA: Diagnosis not present

## 2017-07-16 DIAGNOSIS — C541 Malignant neoplasm of endometrium: Secondary | ICD-10-CM | POA: Diagnosis not present

## 2017-07-16 DIAGNOSIS — Z8041 Family history of malignant neoplasm of ovary: Secondary | ICD-10-CM | POA: Diagnosis not present

## 2017-07-16 DIAGNOSIS — Z90710 Acquired absence of both cervix and uterus: Secondary | ICD-10-CM | POA: Diagnosis not present

## 2017-07-16 DIAGNOSIS — Z923 Personal history of irradiation: Secondary | ICD-10-CM | POA: Diagnosis not present

## 2017-07-16 DIAGNOSIS — C50512 Malignant neoplasm of lower-outer quadrant of left female breast: Secondary | ICD-10-CM | POA: Diagnosis not present

## 2017-07-16 DIAGNOSIS — Z510 Encounter for antineoplastic radiation therapy: Secondary | ICD-10-CM | POA: Diagnosis not present

## 2017-07-16 DIAGNOSIS — N950 Postmenopausal bleeding: Secondary | ICD-10-CM | POA: Diagnosis not present

## 2017-07-16 DIAGNOSIS — Z8542 Personal history of malignant neoplasm of other parts of uterus: Secondary | ICD-10-CM | POA: Diagnosis not present

## 2017-07-16 DIAGNOSIS — Z853 Personal history of malignant neoplasm of breast: Secondary | ICD-10-CM | POA: Diagnosis not present

## 2017-07-17 ENCOUNTER — Ambulatory Visit
Admission: RE | Admit: 2017-07-17 | Discharge: 2017-07-17 | Disposition: A | Discharge status: Home / Self Care | Payer: Medicare HMO | Source: Ambulatory Visit | Attending: Radiation Oncology | Admitting: Radiation Oncology

## 2017-07-17 DIAGNOSIS — C50512 Malignant neoplasm of lower-outer quadrant of left female breast: Secondary | ICD-10-CM | POA: Diagnosis not present

## 2017-07-17 DIAGNOSIS — Z90710 Acquired absence of both cervix and uterus: Secondary | ICD-10-CM | POA: Diagnosis not present

## 2017-07-17 DIAGNOSIS — N950 Postmenopausal bleeding: Secondary | ICD-10-CM | POA: Diagnosis not present

## 2017-07-17 DIAGNOSIS — G620 Drug-induced polyneuropathy: Secondary | ICD-10-CM | POA: Diagnosis not present

## 2017-07-17 DIAGNOSIS — Z510 Encounter for antineoplastic radiation therapy: Secondary | ICD-10-CM | POA: Diagnosis not present

## 2017-07-17 DIAGNOSIS — Z853 Personal history of malignant neoplasm of breast: Secondary | ICD-10-CM | POA: Diagnosis not present

## 2017-07-17 DIAGNOSIS — Z923 Personal history of irradiation: Secondary | ICD-10-CM | POA: Diagnosis not present

## 2017-07-17 DIAGNOSIS — Z8542 Personal history of malignant neoplasm of other parts of uterus: Secondary | ICD-10-CM | POA: Diagnosis not present

## 2017-07-17 DIAGNOSIS — Z9012 Acquired absence of left breast and nipple: Secondary | ICD-10-CM | POA: Diagnosis not present

## 2017-07-17 DIAGNOSIS — Z8041 Family history of malignant neoplasm of ovary: Secondary | ICD-10-CM | POA: Diagnosis not present

## 2017-07-17 DIAGNOSIS — C541 Malignant neoplasm of endometrium: Secondary | ICD-10-CM | POA: Diagnosis not present

## 2017-07-18 ENCOUNTER — Ambulatory Visit
Admission: RE | Admit: 2017-07-18 | Discharge: 2017-07-18 | Disposition: A | Discharge status: Home / Self Care | Payer: Medicare HMO | Source: Ambulatory Visit | Attending: Radiation Oncology | Admitting: Radiation Oncology

## 2017-07-18 DIAGNOSIS — Z90710 Acquired absence of both cervix and uterus: Secondary | ICD-10-CM | POA: Diagnosis not present

## 2017-07-18 DIAGNOSIS — N950 Postmenopausal bleeding: Secondary | ICD-10-CM | POA: Diagnosis not present

## 2017-07-18 DIAGNOSIS — Z8542 Personal history of malignant neoplasm of other parts of uterus: Secondary | ICD-10-CM | POA: Diagnosis not present

## 2017-07-18 DIAGNOSIS — Z9012 Acquired absence of left breast and nipple: Secondary | ICD-10-CM | POA: Diagnosis not present

## 2017-07-18 DIAGNOSIS — G620 Drug-induced polyneuropathy: Secondary | ICD-10-CM | POA: Diagnosis not present

## 2017-07-18 DIAGNOSIS — Z853 Personal history of malignant neoplasm of breast: Secondary | ICD-10-CM | POA: Diagnosis not present

## 2017-07-18 DIAGNOSIS — C50512 Malignant neoplasm of lower-outer quadrant of left female breast: Secondary | ICD-10-CM | POA: Diagnosis not present

## 2017-07-18 DIAGNOSIS — C541 Malignant neoplasm of endometrium: Secondary | ICD-10-CM | POA: Diagnosis not present

## 2017-07-18 DIAGNOSIS — Z510 Encounter for antineoplastic radiation therapy: Secondary | ICD-10-CM | POA: Diagnosis not present

## 2017-07-18 DIAGNOSIS — Z923 Personal history of irradiation: Secondary | ICD-10-CM | POA: Diagnosis not present

## 2017-07-18 DIAGNOSIS — Z8041 Family history of malignant neoplasm of ovary: Secondary | ICD-10-CM | POA: Diagnosis not present

## 2017-07-21 ENCOUNTER — Ambulatory Visit
Admission: RE | Admit: 2017-07-21 | Discharge: 2017-07-21 | Disposition: A | Discharge status: Home / Self Care | Payer: Medicare HMO | Source: Ambulatory Visit | Attending: Radiation Oncology | Admitting: Radiation Oncology

## 2017-07-21 DIAGNOSIS — Z853 Personal history of malignant neoplasm of breast: Secondary | ICD-10-CM | POA: Diagnosis not present

## 2017-07-21 DIAGNOSIS — Z923 Personal history of irradiation: Secondary | ICD-10-CM | POA: Diagnosis not present

## 2017-07-21 DIAGNOSIS — Z8542 Personal history of malignant neoplasm of other parts of uterus: Secondary | ICD-10-CM | POA: Diagnosis not present

## 2017-07-21 DIAGNOSIS — N950 Postmenopausal bleeding: Secondary | ICD-10-CM | POA: Diagnosis not present

## 2017-07-21 DIAGNOSIS — C50512 Malignant neoplasm of lower-outer quadrant of left female breast: Secondary | ICD-10-CM | POA: Diagnosis not present

## 2017-07-21 DIAGNOSIS — Z510 Encounter for antineoplastic radiation therapy: Secondary | ICD-10-CM | POA: Diagnosis not present

## 2017-07-21 DIAGNOSIS — C541 Malignant neoplasm of endometrium: Secondary | ICD-10-CM | POA: Diagnosis not present

## 2017-07-21 DIAGNOSIS — Z90710 Acquired absence of both cervix and uterus: Secondary | ICD-10-CM | POA: Diagnosis not present

## 2017-07-21 DIAGNOSIS — Z8041 Family history of malignant neoplasm of ovary: Secondary | ICD-10-CM | POA: Diagnosis not present

## 2017-07-21 DIAGNOSIS — G620 Drug-induced polyneuropathy: Secondary | ICD-10-CM | POA: Diagnosis not present

## 2017-07-21 DIAGNOSIS — Z9012 Acquired absence of left breast and nipple: Secondary | ICD-10-CM | POA: Diagnosis not present

## 2017-07-22 ENCOUNTER — Ambulatory Visit
Admission: RE | Admit: 2017-07-22 | Discharge: 2017-07-22 | Disposition: A | Discharge status: Home / Self Care | Payer: Medicare HMO | Source: Ambulatory Visit | Attending: Radiation Oncology | Admitting: Radiation Oncology

## 2017-07-22 DIAGNOSIS — Z8542 Personal history of malignant neoplasm of other parts of uterus: Secondary | ICD-10-CM | POA: Diagnosis not present

## 2017-07-22 DIAGNOSIS — Z8041 Family history of malignant neoplasm of ovary: Secondary | ICD-10-CM | POA: Diagnosis not present

## 2017-07-22 DIAGNOSIS — N950 Postmenopausal bleeding: Secondary | ICD-10-CM | POA: Diagnosis not present

## 2017-07-22 DIAGNOSIS — C541 Malignant neoplasm of endometrium: Secondary | ICD-10-CM | POA: Diagnosis not present

## 2017-07-22 DIAGNOSIS — Z90710 Acquired absence of both cervix and uterus: Secondary | ICD-10-CM | POA: Diagnosis not present

## 2017-07-22 DIAGNOSIS — G620 Drug-induced polyneuropathy: Secondary | ICD-10-CM | POA: Diagnosis not present

## 2017-07-22 DIAGNOSIS — Z853 Personal history of malignant neoplasm of breast: Secondary | ICD-10-CM | POA: Diagnosis not present

## 2017-07-22 DIAGNOSIS — C50512 Malignant neoplasm of lower-outer quadrant of left female breast: Secondary | ICD-10-CM | POA: Diagnosis not present

## 2017-07-22 DIAGNOSIS — Z510 Encounter for antineoplastic radiation therapy: Secondary | ICD-10-CM | POA: Diagnosis not present

## 2017-07-22 DIAGNOSIS — Z9012 Acquired absence of left breast and nipple: Secondary | ICD-10-CM | POA: Diagnosis not present

## 2017-07-22 DIAGNOSIS — Z923 Personal history of irradiation: Secondary | ICD-10-CM | POA: Diagnosis not present

## 2017-07-23 ENCOUNTER — Ambulatory Visit
Admission: RE | Admit: 2017-07-23 | Discharge: 2017-07-23 | Disposition: A | Discharge status: Home / Self Care | Payer: Medicare HMO | Source: Ambulatory Visit | Attending: Radiation Oncology | Admitting: Radiation Oncology

## 2017-07-23 DIAGNOSIS — N950 Postmenopausal bleeding: Secondary | ICD-10-CM | POA: Diagnosis not present

## 2017-07-23 DIAGNOSIS — C541 Malignant neoplasm of endometrium: Secondary | ICD-10-CM | POA: Diagnosis not present

## 2017-07-23 DIAGNOSIS — G620 Drug-induced polyneuropathy: Secondary | ICD-10-CM | POA: Diagnosis not present

## 2017-07-23 DIAGNOSIS — Z8041 Family history of malignant neoplasm of ovary: Secondary | ICD-10-CM | POA: Diagnosis not present

## 2017-07-23 DIAGNOSIS — Z853 Personal history of malignant neoplasm of breast: Secondary | ICD-10-CM | POA: Diagnosis not present

## 2017-07-23 DIAGNOSIS — Z9012 Acquired absence of left breast and nipple: Secondary | ICD-10-CM | POA: Diagnosis not present

## 2017-07-23 DIAGNOSIS — Z923 Personal history of irradiation: Secondary | ICD-10-CM | POA: Diagnosis not present

## 2017-07-23 DIAGNOSIS — Z8542 Personal history of malignant neoplasm of other parts of uterus: Secondary | ICD-10-CM | POA: Diagnosis not present

## 2017-07-23 DIAGNOSIS — Z510 Encounter for antineoplastic radiation therapy: Secondary | ICD-10-CM | POA: Diagnosis not present

## 2017-07-23 DIAGNOSIS — C50512 Malignant neoplasm of lower-outer quadrant of left female breast: Secondary | ICD-10-CM | POA: Diagnosis not present

## 2017-07-23 DIAGNOSIS — Z90710 Acquired absence of both cervix and uterus: Secondary | ICD-10-CM | POA: Diagnosis not present

## 2017-07-23 NOTE — Progress Notes (Signed)
Received patient in the clinic today following radiation treatment. Patient with three bottles (compazine, phenergan and zofran) of antiemetics left over from chemotherapy. Patient reports that 1-2 hours following radiation she feels nauseated. Instructed patient to take Zofran (white bottle) one tablet every 8 hours prn or one tablet 30 minutes prior to radiation. Assured the patient zofran does not cause drowsiness. Patient verbalized understanding.

## 2017-07-24 ENCOUNTER — Ambulatory Visit
Admission: RE | Admit: 2017-07-24 | Discharge: 2017-07-24 | Disposition: A | Discharge status: Home / Self Care | Payer: Medicare HMO | Source: Ambulatory Visit | Attending: Radiation Oncology | Admitting: Radiation Oncology

## 2017-07-24 DIAGNOSIS — Z9012 Acquired absence of left breast and nipple: Secondary | ICD-10-CM | POA: Diagnosis not present

## 2017-07-24 DIAGNOSIS — Z79811 Long term (current) use of aromatase inhibitors: Secondary | ICD-10-CM | POA: Diagnosis not present

## 2017-07-24 DIAGNOSIS — I10 Essential (primary) hypertension: Secondary | ICD-10-CM | POA: Diagnosis not present

## 2017-07-24 DIAGNOSIS — Z853 Personal history of malignant neoplasm of breast: Secondary | ICD-10-CM | POA: Diagnosis not present

## 2017-07-24 DIAGNOSIS — C50912 Malignant neoplasm of unspecified site of left female breast: Secondary | ICD-10-CM | POA: Diagnosis not present

## 2017-07-24 DIAGNOSIS — N950 Postmenopausal bleeding: Secondary | ICD-10-CM | POA: Diagnosis not present

## 2017-07-24 DIAGNOSIS — F329 Major depressive disorder, single episode, unspecified: Secondary | ICD-10-CM | POA: Diagnosis not present

## 2017-07-24 DIAGNOSIS — G620 Drug-induced polyneuropathy: Secondary | ICD-10-CM | POA: Diagnosis not present

## 2017-07-24 DIAGNOSIS — Z90722 Acquired absence of ovaries, bilateral: Secondary | ICD-10-CM | POA: Diagnosis not present

## 2017-07-24 DIAGNOSIS — G629 Polyneuropathy, unspecified: Secondary | ICD-10-CM | POA: Diagnosis not present

## 2017-07-24 DIAGNOSIS — C541 Malignant neoplasm of endometrium: Secondary | ICD-10-CM | POA: Diagnosis not present

## 2017-07-24 DIAGNOSIS — Z8041 Family history of malignant neoplasm of ovary: Secondary | ICD-10-CM | POA: Diagnosis not present

## 2017-07-24 DIAGNOSIS — E039 Hypothyroidism, unspecified: Secondary | ICD-10-CM | POA: Diagnosis not present

## 2017-07-24 DIAGNOSIS — Z8542 Personal history of malignant neoplasm of other parts of uterus: Secondary | ICD-10-CM | POA: Diagnosis not present

## 2017-07-24 DIAGNOSIS — C50512 Malignant neoplasm of lower-outer quadrant of left female breast: Secondary | ICD-10-CM | POA: Diagnosis not present

## 2017-07-24 DIAGNOSIS — Z510 Encounter for antineoplastic radiation therapy: Secondary | ICD-10-CM | POA: Diagnosis not present

## 2017-07-24 DIAGNOSIS — C55 Malignant neoplasm of uterus, part unspecified: Secondary | ICD-10-CM | POA: Diagnosis not present

## 2017-07-24 DIAGNOSIS — Z5181 Encounter for therapeutic drug level monitoring: Secondary | ICD-10-CM | POA: Diagnosis not present

## 2017-07-24 DIAGNOSIS — Z923 Personal history of irradiation: Secondary | ICD-10-CM | POA: Diagnosis not present

## 2017-07-24 DIAGNOSIS — Z5111 Encounter for antineoplastic chemotherapy: Secondary | ICD-10-CM | POA: Diagnosis not present

## 2017-07-24 DIAGNOSIS — T451X5A Adverse effect of antineoplastic and immunosuppressive drugs, initial encounter: Secondary | ICD-10-CM | POA: Diagnosis not present

## 2017-07-24 DIAGNOSIS — Z90710 Acquired absence of both cervix and uterus: Secondary | ICD-10-CM | POA: Diagnosis not present

## 2017-07-24 DIAGNOSIS — C773 Secondary and unspecified malignant neoplasm of axilla and upper limb lymph nodes: Secondary | ICD-10-CM | POA: Diagnosis not present

## 2017-07-25 ENCOUNTER — Ambulatory Visit
Admission: RE | Admit: 2017-07-25 | Discharge: 2017-07-25 | Disposition: A | Discharge status: Home / Self Care | Payer: Medicare HMO | Source: Ambulatory Visit | Attending: Radiation Oncology | Admitting: Radiation Oncology

## 2017-07-25 DIAGNOSIS — C50512 Malignant neoplasm of lower-outer quadrant of left female breast: Secondary | ICD-10-CM | POA: Diagnosis not present

## 2017-07-25 DIAGNOSIS — Z510 Encounter for antineoplastic radiation therapy: Secondary | ICD-10-CM | POA: Diagnosis not present

## 2017-07-25 DIAGNOSIS — Z90710 Acquired absence of both cervix and uterus: Secondary | ICD-10-CM | POA: Diagnosis not present

## 2017-07-25 DIAGNOSIS — Z8041 Family history of malignant neoplasm of ovary: Secondary | ICD-10-CM | POA: Diagnosis not present

## 2017-07-25 DIAGNOSIS — C541 Malignant neoplasm of endometrium: Secondary | ICD-10-CM | POA: Diagnosis not present

## 2017-07-25 DIAGNOSIS — Z853 Personal history of malignant neoplasm of breast: Secondary | ICD-10-CM | POA: Diagnosis not present

## 2017-07-25 DIAGNOSIS — G620 Drug-induced polyneuropathy: Secondary | ICD-10-CM | POA: Diagnosis not present

## 2017-07-25 DIAGNOSIS — Z923 Personal history of irradiation: Secondary | ICD-10-CM | POA: Diagnosis not present

## 2017-07-25 DIAGNOSIS — Z9012 Acquired absence of left breast and nipple: Secondary | ICD-10-CM | POA: Diagnosis not present

## 2017-07-25 DIAGNOSIS — Z8542 Personal history of malignant neoplasm of other parts of uterus: Secondary | ICD-10-CM | POA: Diagnosis not present

## 2017-07-25 DIAGNOSIS — N950 Postmenopausal bleeding: Secondary | ICD-10-CM | POA: Diagnosis not present

## 2017-07-28 ENCOUNTER — Ambulatory Visit
Admission: RE | Admit: 2017-07-28 | Discharge: 2017-07-28 | Disposition: A | Discharge status: Home / Self Care | Payer: Medicare HMO | Source: Ambulatory Visit | Attending: Radiation Oncology | Admitting: Radiation Oncology

## 2017-07-28 DIAGNOSIS — N950 Postmenopausal bleeding: Secondary | ICD-10-CM | POA: Diagnosis not present

## 2017-07-28 DIAGNOSIS — Z8542 Personal history of malignant neoplasm of other parts of uterus: Secondary | ICD-10-CM | POA: Diagnosis not present

## 2017-07-28 DIAGNOSIS — Z853 Personal history of malignant neoplasm of breast: Secondary | ICD-10-CM | POA: Diagnosis not present

## 2017-07-28 DIAGNOSIS — G620 Drug-induced polyneuropathy: Secondary | ICD-10-CM | POA: Diagnosis not present

## 2017-07-28 DIAGNOSIS — Z510 Encounter for antineoplastic radiation therapy: Secondary | ICD-10-CM | POA: Diagnosis not present

## 2017-07-28 DIAGNOSIS — Z90710 Acquired absence of both cervix and uterus: Secondary | ICD-10-CM | POA: Diagnosis not present

## 2017-07-28 DIAGNOSIS — C541 Malignant neoplasm of endometrium: Secondary | ICD-10-CM | POA: Diagnosis not present

## 2017-07-28 DIAGNOSIS — Z923 Personal history of irradiation: Secondary | ICD-10-CM | POA: Diagnosis not present

## 2017-07-28 DIAGNOSIS — Z8041 Family history of malignant neoplasm of ovary: Secondary | ICD-10-CM | POA: Diagnosis not present

## 2017-07-28 DIAGNOSIS — C50512 Malignant neoplasm of lower-outer quadrant of left female breast: Secondary | ICD-10-CM | POA: Diagnosis not present

## 2017-07-28 DIAGNOSIS — Z9012 Acquired absence of left breast and nipple: Secondary | ICD-10-CM | POA: Diagnosis not present

## 2017-07-29 ENCOUNTER — Ambulatory Visit
Admission: RE | Admit: 2017-07-29 | Discharge: 2017-07-29 | Disposition: A | Discharge status: Home / Self Care | Payer: Medicare HMO | Source: Ambulatory Visit | Attending: Radiation Oncology | Admitting: Radiation Oncology

## 2017-07-29 DIAGNOSIS — Z9012 Acquired absence of left breast and nipple: Secondary | ICD-10-CM | POA: Diagnosis not present

## 2017-07-29 DIAGNOSIS — Z90710 Acquired absence of both cervix and uterus: Secondary | ICD-10-CM | POA: Diagnosis not present

## 2017-07-29 DIAGNOSIS — Z853 Personal history of malignant neoplasm of breast: Secondary | ICD-10-CM | POA: Diagnosis not present

## 2017-07-29 DIAGNOSIS — C50512 Malignant neoplasm of lower-outer quadrant of left female breast: Secondary | ICD-10-CM | POA: Diagnosis not present

## 2017-07-29 DIAGNOSIS — Z923 Personal history of irradiation: Secondary | ICD-10-CM | POA: Diagnosis not present

## 2017-07-29 DIAGNOSIS — N950 Postmenopausal bleeding: Secondary | ICD-10-CM | POA: Diagnosis not present

## 2017-07-29 DIAGNOSIS — Z8041 Family history of malignant neoplasm of ovary: Secondary | ICD-10-CM | POA: Diagnosis not present

## 2017-07-29 DIAGNOSIS — C541 Malignant neoplasm of endometrium: Secondary | ICD-10-CM | POA: Diagnosis not present

## 2017-07-29 DIAGNOSIS — Z8542 Personal history of malignant neoplasm of other parts of uterus: Secondary | ICD-10-CM | POA: Diagnosis not present

## 2017-07-29 DIAGNOSIS — Z510 Encounter for antineoplastic radiation therapy: Secondary | ICD-10-CM | POA: Diagnosis not present

## 2017-07-29 DIAGNOSIS — G620 Drug-induced polyneuropathy: Secondary | ICD-10-CM | POA: Diagnosis not present

## 2017-07-30 ENCOUNTER — Ambulatory Visit
Admission: RE | Admit: 2017-07-30 | Discharge: 2017-07-30 | Disposition: A | Discharge status: Home / Self Care | Payer: Medicare HMO | Source: Ambulatory Visit | Attending: Radiation Oncology | Admitting: Radiation Oncology

## 2017-07-30 DIAGNOSIS — Z923 Personal history of irradiation: Secondary | ICD-10-CM | POA: Diagnosis not present

## 2017-07-30 DIAGNOSIS — Z90710 Acquired absence of both cervix and uterus: Secondary | ICD-10-CM | POA: Diagnosis not present

## 2017-07-30 DIAGNOSIS — N950 Postmenopausal bleeding: Secondary | ICD-10-CM | POA: Diagnosis not present

## 2017-07-30 DIAGNOSIS — Z8542 Personal history of malignant neoplasm of other parts of uterus: Secondary | ICD-10-CM | POA: Diagnosis not present

## 2017-07-30 DIAGNOSIS — Z853 Personal history of malignant neoplasm of breast: Secondary | ICD-10-CM | POA: Diagnosis not present

## 2017-07-30 DIAGNOSIS — G620 Drug-induced polyneuropathy: Secondary | ICD-10-CM | POA: Diagnosis not present

## 2017-07-30 DIAGNOSIS — C541 Malignant neoplasm of endometrium: Secondary | ICD-10-CM | POA: Diagnosis not present

## 2017-07-30 DIAGNOSIS — C50512 Malignant neoplasm of lower-outer quadrant of left female breast: Secondary | ICD-10-CM | POA: Diagnosis not present

## 2017-07-30 DIAGNOSIS — Z9012 Acquired absence of left breast and nipple: Secondary | ICD-10-CM | POA: Diagnosis not present

## 2017-07-30 DIAGNOSIS — Z8041 Family history of malignant neoplasm of ovary: Secondary | ICD-10-CM | POA: Diagnosis not present

## 2017-07-30 DIAGNOSIS — Z510 Encounter for antineoplastic radiation therapy: Secondary | ICD-10-CM | POA: Diagnosis not present

## 2017-07-31 ENCOUNTER — Ambulatory Visit
Admission: RE | Admit: 2017-07-31 | Discharge: 2017-07-31 | Disposition: A | Discharge status: Home / Self Care | Payer: Medicare HMO | Source: Ambulatory Visit | Attending: Radiation Oncology | Admitting: Radiation Oncology

## 2017-07-31 DIAGNOSIS — T451X5A Adverse effect of antineoplastic and immunosuppressive drugs, initial encounter: Secondary | ICD-10-CM | POA: Diagnosis not present

## 2017-07-31 DIAGNOSIS — Z8542 Personal history of malignant neoplasm of other parts of uterus: Secondary | ICD-10-CM | POA: Diagnosis not present

## 2017-07-31 DIAGNOSIS — E039 Hypothyroidism, unspecified: Secondary | ICD-10-CM | POA: Diagnosis not present

## 2017-07-31 DIAGNOSIS — G620 Drug-induced polyneuropathy: Secondary | ICD-10-CM | POA: Diagnosis not present

## 2017-07-31 DIAGNOSIS — Z923 Personal history of irradiation: Secondary | ICD-10-CM | POA: Diagnosis not present

## 2017-07-31 DIAGNOSIS — Z90710 Acquired absence of both cervix and uterus: Secondary | ICD-10-CM | POA: Diagnosis not present

## 2017-07-31 DIAGNOSIS — N950 Postmenopausal bleeding: Secondary | ICD-10-CM | POA: Diagnosis not present

## 2017-07-31 DIAGNOSIS — Z853 Personal history of malignant neoplasm of breast: Secondary | ICD-10-CM | POA: Diagnosis not present

## 2017-07-31 DIAGNOSIS — Z9012 Acquired absence of left breast and nipple: Secondary | ICD-10-CM | POA: Diagnosis not present

## 2017-07-31 DIAGNOSIS — Z510 Encounter for antineoplastic radiation therapy: Secondary | ICD-10-CM | POA: Diagnosis not present

## 2017-07-31 DIAGNOSIS — Z8041 Family history of malignant neoplasm of ovary: Secondary | ICD-10-CM | POA: Diagnosis not present

## 2017-07-31 DIAGNOSIS — Z5111 Encounter for antineoplastic chemotherapy: Secondary | ICD-10-CM | POA: Diagnosis not present

## 2017-07-31 DIAGNOSIS — C541 Malignant neoplasm of endometrium: Secondary | ICD-10-CM | POA: Diagnosis not present

## 2017-07-31 DIAGNOSIS — C50512 Malignant neoplasm of lower-outer quadrant of left female breast: Secondary | ICD-10-CM | POA: Diagnosis not present

## 2017-07-31 DIAGNOSIS — I10 Essential (primary) hypertension: Secondary | ICD-10-CM | POA: Diagnosis not present

## 2017-08-01 ENCOUNTER — Ambulatory Visit
Admission: RE | Admit: 2017-08-01 | Discharge: 2017-08-01 | Disposition: A | Discharge status: Home / Self Care | Payer: Medicare HMO | Source: Ambulatory Visit | Attending: Radiation Oncology | Admitting: Radiation Oncology

## 2017-08-01 DIAGNOSIS — Z510 Encounter for antineoplastic radiation therapy: Secondary | ICD-10-CM | POA: Diagnosis not present

## 2017-08-01 DIAGNOSIS — Z8542 Personal history of malignant neoplasm of other parts of uterus: Secondary | ICD-10-CM | POA: Diagnosis not present

## 2017-08-01 DIAGNOSIS — C541 Malignant neoplasm of endometrium: Secondary | ICD-10-CM | POA: Diagnosis not present

## 2017-08-01 DIAGNOSIS — N950 Postmenopausal bleeding: Secondary | ICD-10-CM | POA: Diagnosis not present

## 2017-08-01 DIAGNOSIS — Z853 Personal history of malignant neoplasm of breast: Secondary | ICD-10-CM | POA: Diagnosis not present

## 2017-08-01 DIAGNOSIS — C50512 Malignant neoplasm of lower-outer quadrant of left female breast: Secondary | ICD-10-CM | POA: Diagnosis not present

## 2017-08-01 DIAGNOSIS — Z923 Personal history of irradiation: Secondary | ICD-10-CM | POA: Diagnosis not present

## 2017-08-01 DIAGNOSIS — Z90710 Acquired absence of both cervix and uterus: Secondary | ICD-10-CM | POA: Diagnosis not present

## 2017-08-01 DIAGNOSIS — Z9012 Acquired absence of left breast and nipple: Secondary | ICD-10-CM | POA: Diagnosis not present

## 2017-08-01 DIAGNOSIS — Z8041 Family history of malignant neoplasm of ovary: Secondary | ICD-10-CM | POA: Diagnosis not present

## 2017-08-01 DIAGNOSIS — G620 Drug-induced polyneuropathy: Secondary | ICD-10-CM | POA: Diagnosis not present

## 2017-08-04 ENCOUNTER — Ambulatory Visit
Admission: RE | Admit: 2017-08-04 | Discharge: 2017-08-04 | Disposition: A | Discharge status: Home / Self Care | Payer: Medicare HMO | Source: Ambulatory Visit | Attending: Radiation Oncology | Admitting: Radiation Oncology

## 2017-08-04 DIAGNOSIS — Z8041 Family history of malignant neoplasm of ovary: Secondary | ICD-10-CM | POA: Diagnosis not present

## 2017-08-04 DIAGNOSIS — N950 Postmenopausal bleeding: Secondary | ICD-10-CM | POA: Diagnosis not present

## 2017-08-04 DIAGNOSIS — Z510 Encounter for antineoplastic radiation therapy: Secondary | ICD-10-CM | POA: Diagnosis not present

## 2017-08-04 DIAGNOSIS — Z9012 Acquired absence of left breast and nipple: Secondary | ICD-10-CM | POA: Diagnosis not present

## 2017-08-04 DIAGNOSIS — Z853 Personal history of malignant neoplasm of breast: Secondary | ICD-10-CM | POA: Diagnosis not present

## 2017-08-04 DIAGNOSIS — G620 Drug-induced polyneuropathy: Secondary | ICD-10-CM | POA: Diagnosis not present

## 2017-08-04 DIAGNOSIS — C50512 Malignant neoplasm of lower-outer quadrant of left female breast: Secondary | ICD-10-CM | POA: Diagnosis not present

## 2017-08-04 DIAGNOSIS — C541 Malignant neoplasm of endometrium: Secondary | ICD-10-CM | POA: Diagnosis not present

## 2017-08-04 DIAGNOSIS — Z90710 Acquired absence of both cervix and uterus: Secondary | ICD-10-CM | POA: Diagnosis not present

## 2017-08-04 DIAGNOSIS — Z923 Personal history of irradiation: Secondary | ICD-10-CM | POA: Diagnosis not present

## 2017-08-04 DIAGNOSIS — Z8542 Personal history of malignant neoplasm of other parts of uterus: Secondary | ICD-10-CM | POA: Diagnosis not present

## 2017-08-05 ENCOUNTER — Ambulatory Visit
Admission: RE | Admit: 2017-08-05 | Discharge: 2017-08-05 | Disposition: A | Discharge status: Home / Self Care | Payer: Medicare HMO | Source: Ambulatory Visit | Attending: Radiation Oncology | Admitting: Radiation Oncology

## 2017-08-05 DIAGNOSIS — C541 Malignant neoplasm of endometrium: Secondary | ICD-10-CM | POA: Diagnosis not present

## 2017-08-05 DIAGNOSIS — Z8542 Personal history of malignant neoplasm of other parts of uterus: Secondary | ICD-10-CM | POA: Diagnosis not present

## 2017-08-05 DIAGNOSIS — Z510 Encounter for antineoplastic radiation therapy: Secondary | ICD-10-CM | POA: Diagnosis not present

## 2017-08-05 DIAGNOSIS — G620 Drug-induced polyneuropathy: Secondary | ICD-10-CM | POA: Diagnosis not present

## 2017-08-05 DIAGNOSIS — Z923 Personal history of irradiation: Secondary | ICD-10-CM | POA: Diagnosis not present

## 2017-08-05 DIAGNOSIS — Z90710 Acquired absence of both cervix and uterus: Secondary | ICD-10-CM | POA: Diagnosis not present

## 2017-08-05 DIAGNOSIS — Z9012 Acquired absence of left breast and nipple: Secondary | ICD-10-CM | POA: Diagnosis not present

## 2017-08-05 DIAGNOSIS — Z853 Personal history of malignant neoplasm of breast: Secondary | ICD-10-CM | POA: Diagnosis not present

## 2017-08-05 DIAGNOSIS — N950 Postmenopausal bleeding: Secondary | ICD-10-CM | POA: Diagnosis not present

## 2017-08-05 DIAGNOSIS — Z8041 Family history of malignant neoplasm of ovary: Secondary | ICD-10-CM | POA: Diagnosis not present

## 2017-08-05 DIAGNOSIS — C50512 Malignant neoplasm of lower-outer quadrant of left female breast: Secondary | ICD-10-CM | POA: Diagnosis not present

## 2017-08-06 ENCOUNTER — Ambulatory Visit
Admission: RE | Admit: 2017-08-06 | Discharge: 2017-08-06 | Disposition: A | Discharge status: Home / Self Care | Payer: Medicare HMO | Source: Ambulatory Visit | Attending: Radiation Oncology | Admitting: Radiation Oncology

## 2017-08-06 DIAGNOSIS — Z90710 Acquired absence of both cervix and uterus: Secondary | ICD-10-CM | POA: Diagnosis not present

## 2017-08-06 DIAGNOSIS — G620 Drug-induced polyneuropathy: Secondary | ICD-10-CM | POA: Diagnosis not present

## 2017-08-06 DIAGNOSIS — Z510 Encounter for antineoplastic radiation therapy: Secondary | ICD-10-CM | POA: Diagnosis not present

## 2017-08-06 DIAGNOSIS — C50512 Malignant neoplasm of lower-outer quadrant of left female breast: Secondary | ICD-10-CM | POA: Diagnosis not present

## 2017-08-06 DIAGNOSIS — Z9012 Acquired absence of left breast and nipple: Secondary | ICD-10-CM | POA: Diagnosis not present

## 2017-08-06 DIAGNOSIS — N950 Postmenopausal bleeding: Secondary | ICD-10-CM | POA: Diagnosis not present

## 2017-08-06 DIAGNOSIS — Z8041 Family history of malignant neoplasm of ovary: Secondary | ICD-10-CM | POA: Diagnosis not present

## 2017-08-06 DIAGNOSIS — C541 Malignant neoplasm of endometrium: Secondary | ICD-10-CM | POA: Diagnosis not present

## 2017-08-06 DIAGNOSIS — Z853 Personal history of malignant neoplasm of breast: Secondary | ICD-10-CM | POA: Diagnosis not present

## 2017-08-06 DIAGNOSIS — Z8542 Personal history of malignant neoplasm of other parts of uterus: Secondary | ICD-10-CM | POA: Diagnosis not present

## 2017-08-06 DIAGNOSIS — Z923 Personal history of irradiation: Secondary | ICD-10-CM | POA: Diagnosis not present

## 2017-08-07 ENCOUNTER — Ambulatory Visit
Admission: RE | Admit: 2017-08-07 | Discharge: 2017-08-07 | Disposition: A | Discharge status: Home / Self Care | Payer: Medicare HMO | Source: Ambulatory Visit | Attending: Radiation Oncology | Admitting: Radiation Oncology

## 2017-08-07 DIAGNOSIS — Z853 Personal history of malignant neoplasm of breast: Secondary | ICD-10-CM | POA: Diagnosis not present

## 2017-08-07 DIAGNOSIS — G620 Drug-induced polyneuropathy: Secondary | ICD-10-CM | POA: Diagnosis not present

## 2017-08-07 DIAGNOSIS — Z8542 Personal history of malignant neoplasm of other parts of uterus: Secondary | ICD-10-CM | POA: Diagnosis not present

## 2017-08-07 DIAGNOSIS — Z510 Encounter for antineoplastic radiation therapy: Secondary | ICD-10-CM | POA: Diagnosis not present

## 2017-08-07 DIAGNOSIS — Z90710 Acquired absence of both cervix and uterus: Secondary | ICD-10-CM | POA: Diagnosis not present

## 2017-08-07 DIAGNOSIS — N950 Postmenopausal bleeding: Secondary | ICD-10-CM | POA: Diagnosis not present

## 2017-08-07 DIAGNOSIS — Z923 Personal history of irradiation: Secondary | ICD-10-CM | POA: Diagnosis not present

## 2017-08-07 DIAGNOSIS — C541 Malignant neoplasm of endometrium: Secondary | ICD-10-CM | POA: Diagnosis not present

## 2017-08-07 DIAGNOSIS — Z8041 Family history of malignant neoplasm of ovary: Secondary | ICD-10-CM | POA: Diagnosis not present

## 2017-08-07 DIAGNOSIS — Z9012 Acquired absence of left breast and nipple: Secondary | ICD-10-CM | POA: Diagnosis not present

## 2017-08-07 DIAGNOSIS — C50512 Malignant neoplasm of lower-outer quadrant of left female breast: Secondary | ICD-10-CM | POA: Diagnosis not present

## 2017-08-08 ENCOUNTER — Ambulatory Visit
Admission: RE | Admit: 2017-08-08 | Discharge: 2017-08-08 | Disposition: A | Discharge status: Home / Self Care | Payer: Medicare HMO | Source: Ambulatory Visit | Attending: Radiation Oncology | Admitting: Radiation Oncology

## 2017-08-08 DIAGNOSIS — Z853 Personal history of malignant neoplasm of breast: Secondary | ICD-10-CM | POA: Diagnosis not present

## 2017-08-08 DIAGNOSIS — Z90710 Acquired absence of both cervix and uterus: Secondary | ICD-10-CM | POA: Diagnosis not present

## 2017-08-08 DIAGNOSIS — Z8542 Personal history of malignant neoplasm of other parts of uterus: Secondary | ICD-10-CM | POA: Diagnosis not present

## 2017-08-08 DIAGNOSIS — Z9012 Acquired absence of left breast and nipple: Secondary | ICD-10-CM | POA: Diagnosis not present

## 2017-08-08 DIAGNOSIS — C541 Malignant neoplasm of endometrium: Secondary | ICD-10-CM | POA: Diagnosis not present

## 2017-08-08 DIAGNOSIS — G620 Drug-induced polyneuropathy: Secondary | ICD-10-CM | POA: Diagnosis not present

## 2017-08-08 DIAGNOSIS — C50512 Malignant neoplasm of lower-outer quadrant of left female breast: Secondary | ICD-10-CM | POA: Diagnosis not present

## 2017-08-08 DIAGNOSIS — Z510 Encounter for antineoplastic radiation therapy: Secondary | ICD-10-CM | POA: Diagnosis not present

## 2017-08-08 DIAGNOSIS — N950 Postmenopausal bleeding: Secondary | ICD-10-CM | POA: Diagnosis not present

## 2017-08-08 DIAGNOSIS — Z923 Personal history of irradiation: Secondary | ICD-10-CM | POA: Diagnosis not present

## 2017-08-08 DIAGNOSIS — Z8041 Family history of malignant neoplasm of ovary: Secondary | ICD-10-CM | POA: Diagnosis not present

## 2017-08-11 ENCOUNTER — Telehealth: Payer: Self-pay | Admitting: Oncology

## 2017-08-11 ENCOUNTER — Ambulatory Visit
Admission: RE | Admit: 2017-08-11 | Discharge: 2017-08-11 | Disposition: A | Discharge status: Home / Self Care | Payer: Medicare HMO | Source: Ambulatory Visit | Attending: Radiation Oncology | Admitting: Radiation Oncology

## 2017-08-11 DIAGNOSIS — Z923 Personal history of irradiation: Secondary | ICD-10-CM | POA: Diagnosis not present

## 2017-08-11 DIAGNOSIS — Z90710 Acquired absence of both cervix and uterus: Secondary | ICD-10-CM | POA: Diagnosis not present

## 2017-08-11 DIAGNOSIS — C50512 Malignant neoplasm of lower-outer quadrant of left female breast: Secondary | ICD-10-CM | POA: Diagnosis not present

## 2017-08-11 DIAGNOSIS — Z510 Encounter for antineoplastic radiation therapy: Secondary | ICD-10-CM | POA: Diagnosis not present

## 2017-08-11 DIAGNOSIS — Z9012 Acquired absence of left breast and nipple: Secondary | ICD-10-CM | POA: Diagnosis not present

## 2017-08-11 DIAGNOSIS — G620 Drug-induced polyneuropathy: Secondary | ICD-10-CM | POA: Diagnosis not present

## 2017-08-11 DIAGNOSIS — Z853 Personal history of malignant neoplasm of breast: Secondary | ICD-10-CM | POA: Diagnosis not present

## 2017-08-11 DIAGNOSIS — N950 Postmenopausal bleeding: Secondary | ICD-10-CM | POA: Diagnosis not present

## 2017-08-11 DIAGNOSIS — Z8542 Personal history of malignant neoplasm of other parts of uterus: Secondary | ICD-10-CM | POA: Diagnosis not present

## 2017-08-11 DIAGNOSIS — Z8041 Family history of malignant neoplasm of ovary: Secondary | ICD-10-CM | POA: Diagnosis not present

## 2017-08-11 DIAGNOSIS — C541 Malignant neoplasm of endometrium: Secondary | ICD-10-CM | POA: Diagnosis not present

## 2017-08-11 NOTE — Telephone Encounter (Addendum)
Patient called and said she is having more diarrhea and is wondering if there is anything else she can take besides Imodium.  She said the diarrhea happens mostly at night and she is taking 1-2 [employer] brand imodium tablets each time.  Advised her that she can take up to 8 Imodium tablets in 24 hours that she can also try eating a bland diet (BRAT diet) and will see Dr. Sondra Come tomorrow after treatment.  She verbalized understanding and agreement.

## 2017-08-12 ENCOUNTER — Telehealth: Payer: Self-pay | Admitting: *Deleted

## 2017-08-12 ENCOUNTER — Ambulatory Visit
Admission: RE | Admit: 2017-08-12 | Discharge: 2017-08-12 | Disposition: A | Discharge status: Home / Self Care | Payer: Medicare HMO | Source: Ambulatory Visit | Attending: Radiation Oncology | Admitting: Radiation Oncology

## 2017-08-12 DIAGNOSIS — Z510 Encounter for antineoplastic radiation therapy: Secondary | ICD-10-CM | POA: Diagnosis not present

## 2017-08-12 DIAGNOSIS — Z853 Personal history of malignant neoplasm of breast: Secondary | ICD-10-CM | POA: Diagnosis not present

## 2017-08-12 DIAGNOSIS — Z8041 Family history of malignant neoplasm of ovary: Secondary | ICD-10-CM | POA: Diagnosis not present

## 2017-08-12 DIAGNOSIS — Z90710 Acquired absence of both cervix and uterus: Secondary | ICD-10-CM | POA: Diagnosis not present

## 2017-08-12 DIAGNOSIS — Z8542 Personal history of malignant neoplasm of other parts of uterus: Secondary | ICD-10-CM | POA: Diagnosis not present

## 2017-08-12 DIAGNOSIS — C541 Malignant neoplasm of endometrium: Secondary | ICD-10-CM | POA: Diagnosis not present

## 2017-08-12 DIAGNOSIS — Z923 Personal history of irradiation: Secondary | ICD-10-CM | POA: Diagnosis not present

## 2017-08-12 DIAGNOSIS — C50512 Malignant neoplasm of lower-outer quadrant of left female breast: Secondary | ICD-10-CM | POA: Diagnosis not present

## 2017-08-12 DIAGNOSIS — Z9012 Acquired absence of left breast and nipple: Secondary | ICD-10-CM | POA: Diagnosis not present

## 2017-08-12 DIAGNOSIS — N950 Postmenopausal bleeding: Secondary | ICD-10-CM | POA: Diagnosis not present

## 2017-08-12 DIAGNOSIS — G620 Drug-induced polyneuropathy: Secondary | ICD-10-CM | POA: Diagnosis not present

## 2017-08-12 NOTE — Telephone Encounter (Signed)
Called patient to inform of HDR [place], lvm for a return call

## 2017-08-13 ENCOUNTER — Ambulatory Visit
Admission: RE | Admit: 2017-08-13 | Discharge: 2017-08-13 | Disposition: A | Discharge status: Home / Self Care | Payer: Medicare HMO | Source: Ambulatory Visit | Attending: Radiation Oncology | Admitting: Radiation Oncology

## 2017-08-13 DIAGNOSIS — Z853 Personal history of malignant neoplasm of breast: Secondary | ICD-10-CM | POA: Diagnosis not present

## 2017-08-13 DIAGNOSIS — Z8041 Family history of malignant neoplasm of ovary: Secondary | ICD-10-CM | POA: Diagnosis not present

## 2017-08-13 DIAGNOSIS — Z9012 Acquired absence of left breast and nipple: Secondary | ICD-10-CM | POA: Diagnosis not present

## 2017-08-13 DIAGNOSIS — Z8542 Personal history of malignant neoplasm of other parts of uterus: Secondary | ICD-10-CM | POA: Diagnosis not present

## 2017-08-13 DIAGNOSIS — G620 Drug-induced polyneuropathy: Secondary | ICD-10-CM | POA: Diagnosis not present

## 2017-08-13 DIAGNOSIS — C50512 Malignant neoplasm of lower-outer quadrant of left female breast: Secondary | ICD-10-CM | POA: Diagnosis not present

## 2017-08-13 DIAGNOSIS — Z510 Encounter for antineoplastic radiation therapy: Secondary | ICD-10-CM | POA: Diagnosis not present

## 2017-08-13 DIAGNOSIS — Z923 Personal history of irradiation: Secondary | ICD-10-CM | POA: Diagnosis not present

## 2017-08-13 DIAGNOSIS — N950 Postmenopausal bleeding: Secondary | ICD-10-CM | POA: Diagnosis not present

## 2017-08-13 DIAGNOSIS — Z90710 Acquired absence of both cervix and uterus: Secondary | ICD-10-CM | POA: Diagnosis not present

## 2017-08-13 DIAGNOSIS — C541 Malignant neoplasm of endometrium: Secondary | ICD-10-CM | POA: Diagnosis not present

## 2017-08-18 ENCOUNTER — Ambulatory Visit
Admission: RE | Admit: 2017-08-18 | Discharge: 2017-08-18 | Disposition: A | Discharge status: Home / Self Care | Payer: Medicare HMO | Source: Ambulatory Visit | Attending: Radiation Oncology | Admitting: Radiation Oncology

## 2017-08-18 DIAGNOSIS — Z8542 Personal history of malignant neoplasm of other parts of uterus: Secondary | ICD-10-CM | POA: Diagnosis not present

## 2017-08-18 DIAGNOSIS — Z853 Personal history of malignant neoplasm of breast: Secondary | ICD-10-CM | POA: Diagnosis not present

## 2017-08-18 DIAGNOSIS — C50512 Malignant neoplasm of lower-outer quadrant of left female breast: Secondary | ICD-10-CM | POA: Diagnosis not present

## 2017-08-18 DIAGNOSIS — C541 Malignant neoplasm of endometrium: Secondary | ICD-10-CM | POA: Diagnosis not present

## 2017-08-18 DIAGNOSIS — G620 Drug-induced polyneuropathy: Secondary | ICD-10-CM | POA: Diagnosis not present

## 2017-08-18 DIAGNOSIS — Z90710 Acquired absence of both cervix and uterus: Secondary | ICD-10-CM | POA: Diagnosis not present

## 2017-08-18 DIAGNOSIS — N950 Postmenopausal bleeding: Secondary | ICD-10-CM | POA: Diagnosis not present

## 2017-08-18 DIAGNOSIS — Z8041 Family history of malignant neoplasm of ovary: Secondary | ICD-10-CM | POA: Diagnosis not present

## 2017-08-18 DIAGNOSIS — Z510 Encounter for antineoplastic radiation therapy: Secondary | ICD-10-CM | POA: Diagnosis not present

## 2017-08-18 DIAGNOSIS — Z923 Personal history of irradiation: Secondary | ICD-10-CM | POA: Diagnosis not present

## 2017-08-18 DIAGNOSIS — Z9012 Acquired absence of left breast and nipple: Secondary | ICD-10-CM | POA: Diagnosis not present

## 2017-08-19 ENCOUNTER — Ambulatory Visit
Admission: RE | Admit: 2017-08-19 | Discharge: 2017-08-19 | Disposition: A | Discharge status: Home / Self Care | Payer: Medicare HMO | Source: Ambulatory Visit | Attending: Radiation Oncology | Admitting: Radiation Oncology

## 2017-08-19 ENCOUNTER — Encounter: Payer: Self-pay | Admitting: Radiation Oncology

## 2017-08-19 DIAGNOSIS — Z923 Personal history of irradiation: Secondary | ICD-10-CM | POA: Diagnosis not present

## 2017-08-19 DIAGNOSIS — Z9012 Acquired absence of left breast and nipple: Secondary | ICD-10-CM | POA: Diagnosis not present

## 2017-08-19 DIAGNOSIS — C541 Malignant neoplasm of endometrium: Secondary | ICD-10-CM | POA: Diagnosis not present

## 2017-08-19 DIAGNOSIS — Z90710 Acquired absence of both cervix and uterus: Secondary | ICD-10-CM | POA: Diagnosis not present

## 2017-08-19 DIAGNOSIS — G620 Drug-induced polyneuropathy: Secondary | ICD-10-CM | POA: Diagnosis not present

## 2017-08-19 DIAGNOSIS — Z510 Encounter for antineoplastic radiation therapy: Secondary | ICD-10-CM | POA: Diagnosis not present

## 2017-08-19 DIAGNOSIS — N950 Postmenopausal bleeding: Secondary | ICD-10-CM | POA: Diagnosis not present

## 2017-08-19 DIAGNOSIS — Z8041 Family history of malignant neoplasm of ovary: Secondary | ICD-10-CM | POA: Diagnosis not present

## 2017-08-19 DIAGNOSIS — C50512 Malignant neoplasm of lower-outer quadrant of left female breast: Secondary | ICD-10-CM | POA: Diagnosis not present

## 2017-08-19 DIAGNOSIS — Z8542 Personal history of malignant neoplasm of other parts of uterus: Secondary | ICD-10-CM | POA: Diagnosis not present

## 2017-08-19 DIAGNOSIS — Z853 Personal history of malignant neoplasm of breast: Secondary | ICD-10-CM | POA: Diagnosis not present

## 2017-08-20 DIAGNOSIS — C541 Malignant neoplasm of endometrium: Secondary | ICD-10-CM | POA: Diagnosis not present

## 2017-08-20 DIAGNOSIS — E039 Hypothyroidism, unspecified: Secondary | ICD-10-CM | POA: Diagnosis not present

## 2017-08-20 DIAGNOSIS — Z5111 Encounter for antineoplastic chemotherapy: Secondary | ICD-10-CM | POA: Diagnosis not present

## 2017-08-20 DIAGNOSIS — T451X5A Adverse effect of antineoplastic and immunosuppressive drugs, initial encounter: Secondary | ICD-10-CM | POA: Diagnosis not present

## 2017-08-20 DIAGNOSIS — I10 Essential (primary) hypertension: Secondary | ICD-10-CM | POA: Diagnosis not present

## 2017-08-20 DIAGNOSIS — G620 Drug-induced polyneuropathy: Secondary | ICD-10-CM | POA: Diagnosis not present

## 2017-08-21 DIAGNOSIS — Z90722 Acquired absence of ovaries, bilateral: Secondary | ICD-10-CM | POA: Diagnosis not present

## 2017-08-21 DIAGNOSIS — C541 Malignant neoplasm of endometrium: Secondary | ICD-10-CM | POA: Diagnosis not present

## 2017-08-21 DIAGNOSIS — G629 Polyneuropathy, unspecified: Secondary | ICD-10-CM | POA: Diagnosis not present

## 2017-08-21 DIAGNOSIS — Z90710 Acquired absence of both cervix and uterus: Secondary | ICD-10-CM | POA: Diagnosis not present

## 2017-08-21 DIAGNOSIS — C50912 Malignant neoplasm of unspecified site of left female breast: Secondary | ICD-10-CM | POA: Diagnosis not present

## 2017-08-21 DIAGNOSIS — T451X5A Adverse effect of antineoplastic and immunosuppressive drugs, initial encounter: Secondary | ICD-10-CM | POA: Diagnosis not present

## 2017-08-21 DIAGNOSIS — E039 Hypothyroidism, unspecified: Secondary | ICD-10-CM | POA: Diagnosis not present

## 2017-08-21 DIAGNOSIS — G620 Drug-induced polyneuropathy: Secondary | ICD-10-CM | POA: Diagnosis not present

## 2017-08-21 DIAGNOSIS — Z79811 Long term (current) use of aromatase inhibitors: Secondary | ICD-10-CM | POA: Diagnosis not present

## 2017-08-21 DIAGNOSIS — Z5111 Encounter for antineoplastic chemotherapy: Secondary | ICD-10-CM | POA: Diagnosis not present

## 2017-08-21 DIAGNOSIS — I10 Essential (primary) hypertension: Secondary | ICD-10-CM | POA: Diagnosis not present

## 2017-08-21 DIAGNOSIS — C773 Secondary and unspecified malignant neoplasm of axilla and upper limb lymph nodes: Secondary | ICD-10-CM | POA: Diagnosis not present

## 2017-08-21 DIAGNOSIS — F329 Major depressive disorder, single episode, unspecified: Secondary | ICD-10-CM | POA: Diagnosis not present

## 2017-08-21 DIAGNOSIS — C55 Malignant neoplasm of uterus, part unspecified: Secondary | ICD-10-CM | POA: Diagnosis not present

## 2017-08-21 DIAGNOSIS — Z5181 Encounter for therapeutic drug level monitoring: Secondary | ICD-10-CM | POA: Diagnosis not present

## 2017-08-22 ENCOUNTER — Other Ambulatory Visit: Payer: Self-pay | Admitting: Oncology

## 2017-08-26 ENCOUNTER — Telehealth: Payer: Self-pay | Admitting: *Deleted

## 2017-08-26 NOTE — Telephone Encounter (Signed)
Called patient to remind of HDR [place] for 08-27-17, lvm for a return call

## 2017-08-27 ENCOUNTER — Ambulatory Visit
Admission: RE | Admit: 2017-08-27 | Discharge: 2017-08-27 | Disposition: A | Discharge status: Home / Self Care | Payer: Medicare HMO | Source: Ambulatory Visit | Attending: Radiation Oncology | Admitting: Radiation Oncology

## 2017-08-27 ENCOUNTER — Encounter: Payer: Self-pay | Admitting: Radiation Oncology

## 2017-08-27 VITALS — BP 112/91 | HR 88 | Temp 98.7°F | Ht 66.5 in | Wt 153.0 lb

## 2017-08-27 DIAGNOSIS — Z79899 Other long term (current) drug therapy: Secondary | ICD-10-CM | POA: Insufficient documentation

## 2017-08-27 DIAGNOSIS — Z923 Personal history of irradiation: Secondary | ICD-10-CM | POA: Diagnosis not present

## 2017-08-27 DIAGNOSIS — Z90710 Acquired absence of both cervix and uterus: Secondary | ICD-10-CM | POA: Diagnosis not present

## 2017-08-27 DIAGNOSIS — Z853 Personal history of malignant neoplasm of breast: Secondary | ICD-10-CM | POA: Diagnosis not present

## 2017-08-27 DIAGNOSIS — E860 Dehydration: Secondary | ICD-10-CM | POA: Diagnosis not present

## 2017-08-27 DIAGNOSIS — Z8542 Personal history of malignant neoplasm of other parts of uterus: Secondary | ICD-10-CM | POA: Diagnosis not present

## 2017-08-27 DIAGNOSIS — C541 Malignant neoplasm of endometrium: Secondary | ICD-10-CM | POA: Diagnosis not present

## 2017-08-27 DIAGNOSIS — R5383 Other fatigue: Secondary | ICD-10-CM | POA: Diagnosis not present

## 2017-08-27 DIAGNOSIS — G620 Drug-induced polyneuropathy: Secondary | ICD-10-CM | POA: Diagnosis not present

## 2017-08-27 DIAGNOSIS — N950 Postmenopausal bleeding: Secondary | ICD-10-CM | POA: Diagnosis not present

## 2017-08-27 DIAGNOSIS — Z9012 Acquired absence of left breast and nipple: Secondary | ICD-10-CM | POA: Diagnosis not present

## 2017-08-27 DIAGNOSIS — Z510 Encounter for antineoplastic radiation therapy: Secondary | ICD-10-CM | POA: Diagnosis not present

## 2017-08-27 DIAGNOSIS — Z8041 Family history of malignant neoplasm of ovary: Secondary | ICD-10-CM | POA: Diagnosis not present

## 2017-08-27 MED ORDER — LORAZEPAM 0.5 MG PO TABS
0.5000 mg | ORAL_TABLET | Freq: Once | ORAL | Status: AC
  Administered 2017-08-27: 0.5 mg via SUBLINGUAL
  Filled 2017-08-27: qty 1

## 2017-08-27 NOTE — Progress Notes (Signed)
  Radiation Oncology         [phone] ________________________________  Name: Autumn Frazier MRN: [id number]  Date: 08/27/2017  DOB: 26-May-1941  SIMULATION AND TREATMENT PLANNING NOTE HDR BRACHYTHERAPY  DIAGNOSIS: Stage III-C high-grade serous carcinoma of the uterus (pT1a pN1)  NARRATIVE:  The patient was brought to the [facility].  Identity was confirmed.  All relevant records and images related to the planned course of therapy were reviewed.  The patient freely provided informed written consent to proceed with treatment after reviewing the details related to the planned course of therapy. The consent form was witnessed and verified by the simulation staff.  Then, the patient was set-up in a stable reproducible  supine position for radiation therapy.  CT images were obtained.  Surface markings were placed.  The CT images were loaded into the planning software.  Then the target and avoidance structures were contoured.  Treatment planning then occurred.  The radiation prescription was entered and confirmed.   I have requested : Brachytherapy Isodose Plan and Dosimetry Calculations to plan the radiation distribution.    PLAN:  The patient will receive 18 Gy in 3 fraction.The patient will be treated with iridum 192 as a high rate dose source.   -----------------------------------  Blair Promise, PhD, MD This document serves as a record of services personally performed by Gery Pray, MD. It was created on his behalf by Valeta Harms, a trained medical scribe. The creation of this record is based on the scribe's personal observations and the provider's statements to them. This document has been checked and approved by the attending provider.

## 2017-08-27 NOTE — Progress Notes (Signed)
Autumn Frazier is here for follow up/HDR.  She denies having any pain, nausea, bladder/bowel issues or vaginal bleeding.  She reports she had not had diarrhea since Sunday.  She reports having fatigue in the late afternoon.    BP (!) 112/91 (BP Location: Right Arm, Patient Position: Sitting)   Pulse 88   Temp 98.7 F (37.1 C) (Oral)   Ht 5' 6.5" (1.689 m)   Wt 153 lb (69.4 kg)   SpO2 97%   BMI 24.32 kg/m    Wt Readings from Last 3 Encounters:  08/27/17 153 lb (69.4 kg)  07/03/17 160 lb (72.6 kg)  07/02/17 162 lb 9.6 oz (73.8 kg)

## 2017-08-27 NOTE — Progress Notes (Signed)
Radiation Oncology         (336) [phone] ________________________________  Name: Autumn Frazier MRN: [id number]  Date: 08/27/2017  DOB: 1941-04-29  Vaginal Brachytherapy Procedure Note  CC: Josetta Huddle, MD Hart Rochester, MD    ICD-10-CM   1. Endometrial cancer (HCC) C54.1 LORazepam (ATIVAN) tablet 0.5 mg    Diagnosis: Stage III-C high-grade serous carcinoma of the uterus (pT1a pN1)    Narrative: Patient returns today for vaginal cylinder fitting.The patient reports that she had not had diarrhea since Sunday. Pt notes having fatigue in the late afternoon. The patient denies having any pain, nausea, bladder/bowel issues or vaginal bleeding   ALLERGIES: has No Known Allergies.  Meds: Current Outpatient Medications  Medication Sig Dispense Refill  . acetaminophen (TYLENOL) 500 MG tablet Take 500 mg by mouth every 6 (six) hours as needed.    Marland Kitchen anastrozole (ARIMIDEX) 1 MG tablet TAKE 1 TABLET(1 MG) BY MOUTH DAILY 90 tablet 0  . b complex vitamins tablet Take 1 tablet by mouth daily.    Marland Kitchen escitalopram (LEXAPRO) 10 MG tablet Take by mouth.    . Fluticasone-Salmeterol (ADVAIR) 100-50 MCG/DOSE AEPB Inhale 1 puff into the lungs every 12 (twelve) hours. 60 each 1  . gabapentin (NEURONTIN) 300 MG capsule TK ONE C PO  HS.  2  . Levothyroxine Sodium 88 MCG CAPS Patient is taking 71mcg daily  3  . Vitamin D, Ergocalciferol, (DRISDOL) 50000 units CAPS capsule Take 50,000 Units by mouth every 7 (seven) days.    Marland Kitchen aspirin 81 MG tablet Take 81 mg by mouth daily.    . Biotin 5000 MCG TABS Take by mouth.    . cyclobenzaprine (FLEXERIL) 10 MG tablet Take 1 tablet (10 mg total) by mouth 2 (two) times daily as needed for muscle spasms. (Patient not taking: Reported on 07/02/2017) 20 tablet 0  . LORazepam (ATIVAN) 0.5 MG tablet Take 1 tablet (0.5 mg total) by mouth every 8 (eight) hours. As needed for anxiety or nausea (Patient not taking: Reported on 08/27/2017) 30 tablet 0  . Melatonin 5 MG TBDP  Take by mouth.    . ondansetron (ZOFRAN) 8 MG tablet TK 1 T PO  Q 8 H PRF NAUSEA.  1   No current facility-administered medications for this encounter.     Physical Findings: The patient is in no acute distress. Patient is alert and oriented.  height is 5' 6.5" (1.689 m) and weight is 153 lb (69.4 kg). Her oral temperature is 98.7 F (37.1 C). Her blood pressure is 112/91 (abnormal) and her pulse is 88. Her oxygen saturation is 97%.   No palpable cervical, supraclavicular or axillary lymphoadenopathy. The heart has a regular rate and rhythm. The lungs are clear to auscultation. Abdomen soft and non-tender.  On pelvic examination the external genitalia were unremarkable. A speculum exam was performed. Vaginal cuff intact, no mucosal lesions. On bimanual exam there were no pelvic masses appreciated.  Lab Findings: Lab Results  Component Value Date   WBC 4.4 06/30/2017   HGB 10.6 (L) 06/30/2017   HCT 33.0 (L) 06/30/2017   MCV 95.9 06/30/2017   PLT 114 (L) 06/30/2017    Radiographic Findings: No results found.  Impression: Patient was fitted for a vaginal cylinder. The patient will be treated with a 2.5 cm diameter cylinder with a treatment length of 3.0 cm. This distended the vaginal vault without undue discomfort. The patient tolerated the procedure well.  The patient was successfully fitted for a vaginal  cylinder. The patient is appropriate to begin vaginal brachytherapy.   Plan: The patient will proceed with CT simulation and vaginal brachytherapy today.    _______________________________   Blair Promise, PhD, MD This document serves as a record of services personally performed by Gery Pray, MD. It was created on his behalf by Valeta Harms, a trained medical scribe. The creation of this record is based on the scribe's personal observations and the provider's statements to them. This document has been checked and approved by the attending provider.

## 2017-08-27 NOTE — Progress Notes (Signed)
  Radiation Oncology         [phone] ________________________________  Name: Autumn Frazier MRN: [id number]  Date: 08/27/2017  DOB: 1940-11-16  CC: Josetta Huddle, MD  Hart Rochester, MD  HDR BRACHYTHERAPY NOTE  DIAGNOSIS: Stage III-C high-grade serous carcinoma of the uterus (pT1a pN1)     Simple treatment device note: Patient had construction of her custom vaginal cylinder. She will be treated with a 2.5 cm diameter segmented cylinder. This conforms to her anatomy without undue discomfort.  Vaginal brachytherapy procedure node: The patient was brought to the [facility] suite. Identity was confirmed. All relevant records and images related to the planned course of therapy were reviewed. The patient freely provided informed written consent to proceed with treatment after reviewing the details related to the planned course of therapy. The consent form was witnessed and verified by the simulation staff. Then, the patient was set-up in a stable reproducible supine position for radiation therapy. Pelvic exam revealed the vaginal cuff to be intact . The patient's custom vaginal cylinder was placed in the proximal vagina. This was affixed to the CT/MR stabilization plate to prevent slippage. Patient tolerated the placement well.  Verification simulation note:  A fiducial marker was placed within the vaginal cylinder. An AP and lateral film was then obtained through the pelvis area. This documented accurate position of the vaginal cylinder for treatment.  HDR BRACHYTHERAPY TREATMENT  The remote afterloading device was affixed to the vaginal cylinder by catheter. Patient then proceeded to undergo her first high-dose-rate treatment directed at the proximal vagina. The patient was prescribed a dose of 6 gray to be delivered to the mucosal surface. Treatment length was 3 cm. Patient was treated with 1 channel using 7 dwell positions. Treatment time was 229.8 seconds. Iridium 192 was the high-dose-rate  source for treatment. The patient tolerated the treatment well. After completion of her therapy, a radiation survey was performed documenting return of the iridium source into the GammaMed safe.   PLAN: The patient will return in 1 week for her second high-dose radiation treatment. ________________________________  Blair Promise, PhD, MD   This document serves as a record of services personally performed by Gery Pray, MD. It was created on his behalf by Valeta Harms, a trained medical scribe. The creation of this record is based on the scribe's personal observations and the provider's statements to them. This document has been checked and approved by the attending provider.

## 2017-08-28 ENCOUNTER — Encounter: Payer: Self-pay | Admitting: Radiation Oncology

## 2017-09-03 ENCOUNTER — Telehealth: Payer: Self-pay | Admitting: *Deleted

## 2017-09-03 NOTE — Telephone Encounter (Signed)
CALLED PATIENT TO REMIND OF HDR TX. FOR 09-04-17 @ 9 AM, SPOKE WITH PATIENT AND SHE IS AWARE OF THIS [place].

## 2017-09-04 ENCOUNTER — Ambulatory Visit
Admission: RE | Admit: 2017-09-04 | Discharge: 2017-09-04 | Disposition: A | Discharge status: Home / Self Care | Payer: Medicare HMO | Source: Ambulatory Visit | Attending: Radiation Oncology | Admitting: Radiation Oncology

## 2017-09-04 DIAGNOSIS — C541 Malignant neoplasm of endometrium: Secondary | ICD-10-CM | POA: Diagnosis not present

## 2017-09-04 DIAGNOSIS — R5383 Other fatigue: Secondary | ICD-10-CM | POA: Diagnosis not present

## 2017-09-04 DIAGNOSIS — E860 Dehydration: Secondary | ICD-10-CM | POA: Diagnosis not present

## 2017-09-04 DIAGNOSIS — Z79899 Other long term (current) drug therapy: Secondary | ICD-10-CM | POA: Diagnosis not present

## 2017-09-04 NOTE — Progress Notes (Signed)
  Radiation Oncology         [phone] ________________________________  Name: Autumn Frazier MRN: [id number]  Date: 09/04/2017  DOB: 02-09-1941  CC: Josetta Huddle, MD  Hart Rochester, MD  HDR BRACHYTHERAPY NOTE  DIAGNOSIS: Stage III-C high-grade serous carcinoma of the uterus (pT1a pN1)   Simple treatment device note: Patient had construction of her custom vaginal cylinder. She will be treated with a 2.5 cm diameter segmented cylinder. This conforms to her anatomy without undue discomfort.  Vaginal brachytherapy procedure node: The patient was brought to the [facility] suite. Identity was confirmed. All relevant records and images related to the planned course of therapy were reviewed. The patient freely provided informed written consent to proceed with treatment after reviewing the details related to the planned course of therapy. The consent form was witnessed and verified by the simulation staff. Then, the patient was set-up in a stable reproducible supine position for radiation therapy. Pelvic exam revealed the vaginal cuff to be intact . The patient's custom vaginal cylinder was placed in the proximal vagina. This was affixed to the CT/MR stabilization plate to prevent slippage. Patient tolerated the placement well.  Verification simulation note:  A fiducial marker was placed within the vaginal cylinder. An AP and lateral film was then obtained through the pelvis area. This documented accurate position of the vaginal cylinder for treatment.  HDR BRACHYTHERAPY TREATMENT  The remote afterloading device was affixed to the vaginal cylinder by catheter. Patient then proceeded to undergo her second high-dose-rate treatment directed at the proximal vagina. The patient was prescribed a dose of 6 gray to be delivered to the mucosal surface. Treatment length was 3 cm. Patient was treated with 1 channel using 7 dwell positions. Treatment time was 247.60 seconds. Iridium 192 was the high-dose-rate  source for treatment. The patient tolerated the treatment well. After completion of her therapy, a radiation survey was performed documenting return of the iridium source into the GammaMed safe.   PLAN: The patient will return in 1 week for her third high-dose radiation treatment. ________________________________  Blair Promise, PhD, MD   This document serves as a record of services personally performed by Gery Pray, MD. It was created on his behalf by Marlowe Kays, a trained medical scribe. The creation of this record is based on the scribe's personal observations and the provider's statements to them. This document has been checked and approved by the attending provider.

## 2017-09-08 ENCOUNTER — Telehealth: Payer: Self-pay | Admitting: Oncology

## 2017-09-08 DIAGNOSIS — C541 Malignant neoplasm of endometrium: Secondary | ICD-10-CM

## 2017-09-08 NOTE — Telephone Encounter (Signed)
Patient called and said she had not felt well, like "she is walking around with the flu" which started after her second radiation treatment.  She said she used to feel this way after chemotherapy and is wondering if radiation is causing it.  She also said she has a lot of discomfort in her midsection with occasional nausea/vomiting.  She also had diarrhea this morning.  She denies having any bladder issues, fever or chills.  Dr. Sondra Come notified and arranged with patient appointments for labs to be drawn at 10 am tomorrow and to see Dr. Sondra Come at 10:30.

## 2017-09-09 ENCOUNTER — Ambulatory Visit
Admission: RE | Admit: 2017-09-09 | Discharge: 2017-09-09 | Disposition: A | Discharge status: Home / Self Care | Payer: Medicare HMO | Source: Ambulatory Visit | Attending: Radiation Oncology | Admitting: Radiation Oncology

## 2017-09-09 ENCOUNTER — Other Ambulatory Visit: Payer: Self-pay

## 2017-09-09 ENCOUNTER — Other Ambulatory Visit: Payer: Self-pay | Admitting: Oncology

## 2017-09-09 ENCOUNTER — Ambulatory Visit (HOSPITAL_BASED_OUTPATIENT_CLINIC_OR_DEPARTMENT_OTHER): Payer: Medicare HMO

## 2017-09-09 VITALS — BP 136/90 | HR 101 | Temp 98.1°F | Ht 66.5 in | Wt 148.8 lb

## 2017-09-09 DIAGNOSIS — Z95828 Presence of other vascular implants and grafts: Secondary | ICD-10-CM

## 2017-09-09 DIAGNOSIS — C50512 Malignant neoplasm of lower-outer quadrant of left female breast: Secondary | ICD-10-CM

## 2017-09-09 DIAGNOSIS — Z79899 Other long term (current) drug therapy: Secondary | ICD-10-CM | POA: Diagnosis not present

## 2017-09-09 DIAGNOSIS — E860 Dehydration: Secondary | ICD-10-CM | POA: Diagnosis not present

## 2017-09-09 DIAGNOSIS — C541 Malignant neoplasm of endometrium: Secondary | ICD-10-CM

## 2017-09-09 DIAGNOSIS — R5383 Other fatigue: Secondary | ICD-10-CM | POA: Diagnosis not present

## 2017-09-09 LAB — CBC WITH DIFFERENTIAL/PLATELET
BASO%: 0.3 % (ref 0.0–2.0)
Basophils Absolute: 0 10*3/uL (ref 0.0–0.1)
EOS%: 1.4 % (ref 0.0–7.0)
Eosinophils Absolute: 0.1 10*3/uL (ref 0.0–0.5)
HEMATOCRIT: 36.2 % (ref 34.8–46.6)
HGB: 11.5 g/dL — ABNORMAL LOW (ref 11.6–15.9)
LYMPH#: 1.2 10*3/uL (ref 0.9–3.3)
LYMPH%: 33.9 % (ref 14.0–49.7)
MCH: 32.6 pg (ref 25.1–34.0)
MCHC: 31.8 g/dL (ref 31.5–36.0)
MCV: 102.5 fL — ABNORMAL HIGH (ref 79.5–101.0)
MONO#: 0.4 10*3/uL (ref 0.1–0.9)
MONO%: 12.3 % (ref 0.0–14.0)
NEUT%: 52.1 % (ref 38.4–76.8)
NEUTROS ABS: 1.9 10*3/uL (ref 1.5–6.5)
PLATELETS: 115 10*3/uL — AB (ref 145–400)
RBC: 3.53 10*6/uL — ABNORMAL LOW (ref 3.70–5.45)
RDW: 14.7 % — AB (ref 11.2–14.5)
WBC: 3.6 10*3/uL — AB (ref 3.9–10.3)

## 2017-09-09 LAB — BASIC METABOLIC PANEL
Anion Gap: 8 mEq/L (ref 3–11)
BUN: 11 mg/dL (ref 7.0–26.0)
CHLORIDE: 105 meq/L (ref 98–109)
CO2: 26 mEq/L (ref 22–29)
Calcium: 9 mg/dL (ref 8.4–10.4)
Creatinine: 0.9 mg/dL (ref 0.6–1.1)
EGFR: 58 mL/min/{1.73_m2} — AB (ref >60)
GLUCOSE: 93 mg/dL (ref 70–140)
POTASSIUM: 4.3 meq/L (ref 3.5–5.1)
Sodium: 139 mEq/L (ref 136–145)

## 2017-09-09 MED ORDER — HEPARIN SOD (PORK) LOCK FLUSH 100 UNIT/ML IV SOLN
500.0000 [IU] | Freq: Once | INTRAVENOUS | Status: AC
  Administered 2017-09-09: 500 [IU] via INTRAVENOUS
  Filled 2017-09-09: qty 5

## 2017-09-09 MED ORDER — SODIUM CHLORIDE 0.9 % IV SOLN
INTRAVENOUS | Status: AC
  Administered 2017-09-09: 12:00:00 via INTRAVENOUS

## 2017-09-09 MED ORDER — SODIUM CHLORIDE 0.9% FLUSH
10.0000 mL | INTRAVENOUS | Status: AC | PRN
  Administered 2017-09-09: 10 mL via INTRAVENOUS
  Filled 2017-09-09: qty 10

## 2017-09-09 NOTE — Patient Instructions (Signed)
Dehydration, Adult Dehydration is when there is not enough fluid or water in your body. This happens when you lose more fluids than you take in. Dehydration can range from mild to very bad. It should be treated right away to keep it from getting very bad. Symptoms of mild dehydration may include:  Thirst.  Dry lips.  Slightly dry mouth.  Dry, warm skin.  Dizziness. Symptoms of moderate dehydration may include:  Very dry mouth.  Muscle cramps.  Dark pee (urine). Pee may be the color of tea.  Your body making less pee.  Your eyes making fewer tears.  Heartbeat that is uneven or faster than normal (palpitations).  Headache.  Light-headedness, especially when you stand up from sitting.  Fainting (syncope). Symptoms of very bad dehydration may include:  Changes in skin, such as: ? Cold and clammy skin. ? Blotchy (mottled) or pale skin. ? Skin that does not quickly return to normal after being lightly pinched and let go (poor skin turgor).  Changes in body fluids, such as: ? Feeling very thirsty. ? Your eyes making fewer tears. ? Not sweating when body temperature is high, such as in hot weather. ? Your body making very little pee.  Changes in vital signs, such as: ? Weak pulse. ? Pulse that is more than 100 beats a minute when you are sitting still. ? Fast breathing. ? Low blood pressure.  Other changes, such as: ? Sunken eyes. ? Cold hands and feet. ? Confusion. ? Lack of energy (lethargy). ? Trouble waking up from sleep. ? Short-term weight loss. ? Unconsciousness. Follow these instructions at home:  If told by your doctor, drink an ORS: ? Make an ORS by using instructions on the package. ? Start by drinking small amounts, about  cup (120 mL) every 5-10 minutes. ? Slowly drink more until you have had the amount that your doctor said to have.  Drink enough clear fluid to keep your pee clear or pale yellow. If you were told to drink an ORS, finish the ORS  first, then start slowly drinking clear fluids. Drink fluids such as: ? Water. Do not drink only water by itself. Doing that can make the salt (sodium) level in your body get too low (hyponatremia). ? Ice chips. ? Fruit juice that you have added water to (diluted). ? Low-calorie sports drinks.  Avoid: ? Alcohol. ? Drinks that have a lot of sugar. These include high-calorie sports drinks, fruit juice that does not have water added, and soda. ? Caffeine. ? Foods that are greasy or have a lot of fat or sugar.  Take over-the-counter and prescription medicines only as told by your doctor.  Do not take salt tablets. Doing that can make the salt level in your body get too high (hypernatremia).  Eat foods that have minerals (electrolytes). Examples include bananas, oranges, potatoes, tomatoes, and spinach.  Keep all follow-up visits as told by your doctor. This is important. Contact a doctor if:  You have belly (abdominal) pain that: ? Gets worse. ? Stays in one area (localizes).  You have a rash.  You have a stiff neck.  You get angry or annoyed more easily than normal (irritability).  You are more sleepy than normal.  You have a harder time waking up than normal.  You feel: ? Weak. ? Dizzy. ? Very thirsty.  You have peed (urinated) only a small amount of very dark pee during 6-8 hours. Get help right away if:  You have symptoms of   very bad dehydration.  You cannot drink fluids without throwing up (vomiting).  Your symptoms get worse with treatment.  You have a fever.  You have a very bad headache.  You are throwing up or having watery poop (diarrhea) and it: ? Gets worse. ? Does not go away.  You have blood or something green (bile) in your throw-up.  You have blood in your poop (stool). This may cause poop to look black and tarry.  You have not peed in 6-8 hours.  You pass out (faint).  Your heart rate when you are sitting still is more than 100 beats a  minute.  You have trouble breathing. This information is not intended to replace advice given to you by your health care provider. Make sure you discuss any questions you have with your health care provider. Document Released: 07/06/2009 Document Revised: 03/29/2016 Document Reviewed: 11/03/2015 Elsevier Interactive Patient Education  2018 [employer] Inc.  

## 2017-09-09 NOTE — Progress Notes (Signed)
  Radiation Oncology         [phone] ________________________________  Name: Autumn Frazier MRN: [id number]  Date: 09/09/2017  DOB: 1940/11/08  Weekly Radiation Therapy Management    ICD-10-CM   1. Endometrial cancer ([place]) C54.1      Current Dose: 45 Gy     Planned Dose:  45+ Gy  Narrative . . . . . . . . The patient presents prior to her second high-dose-rate treatment                                   Pier reports feeling weak and dizzy that started on Saturday like she felt during chemo.  She said she feels "dizzy headed."  She said she felt like she was going to pass out in [place] on Sunday.  Orthostatic vitals taken: 136/90, hr 101 sitting, bp 121/83, hr 103 standing.  She reports having nausea yesterday.  She has lost 5 lbs since 12/5.  She said she is trying to eat.  She also reports having dull pain in her central abdomen that radiates around to her left side.  She said it is better today.  She last had herceptin 3 weeks ago and will start chemo again on 12/27.                                   Set-up films were reviewed.                                 The chart was checked. Physical Findings. . .  height is 5' 6.5" (1.689 m) and weight is 148 lb 12.8 oz (67.5 kg). Her temperature is 98.1 F (36.7 C). Her blood pressure is 136/90 and her pulse is 101 (abnormal). Her oxygen saturation is 97%. . The lungs are clear. The heart has a regular rhythm and rate. The abdomen is soft and nontender with normal bowel sounds. No rebound or guarding present.  The oral cavity is somewhat dry without secondary infection Impression . . . . . . . Patient is having signs of dehydration with dizziness with standing. Her orthostatic blood pressure measurements show changes consistent with dehydration. Plan . . . . . . . . . . . . Patient underwent blood work earlier today. In light of her symptoms she will proceed with a liter of IV fluids. She will tentatively return on Thursday of this week for her  last high-dose-rate treatment.   ________________________________   Blair Promise, PhD, MD

## 2017-09-09 NOTE — Progress Notes (Signed)
Autumn Frazier reports feeling weak and dizzy that started on Saturday like she felt during chemo.  She said she feels "dizzy headed."  She said she felt like she was going to pass out in [place] on Sunday.  Orthostatic vitals taken: 136/90, hr 101 sitting, bp 121/83, hr 103 standing.  She reports having nausea yesterday.  She has lost 5 lbs since 12/5.  She said she is trying to eat.  She also reports having dull pain in her central abdomen that radiates around to her left side.  She said it is better today.  She last had herceptin 3 weeks ago and will start chemo again on 12/27.  BP 136/90 (BP Location: Right Arm, Patient Position: Sitting)   Pulse (!) 101   Temp 98.1 F (36.7 C)   Ht 5' 6.5" (1.689 m)   Wt 148 lb 12.8 oz (67.5 kg)   SpO2 97%   BMI 23.66 kg/m   Wt Readings from Last 3 Encounters:  09/09/17 148 lb 12.8 oz (67.5 kg)  08/27/17 153 lb (69.4 kg)  07/03/17 160 lb (72.6 kg)

## 2017-09-10 ENCOUNTER — Telehealth: Payer: Self-pay | Admitting: *Deleted

## 2017-09-10 NOTE — Telephone Encounter (Signed)
Called patient to remind of [place]. for 09-11-17 @ 9 am, lvm for a return call

## 2017-09-11 ENCOUNTER — Ambulatory Visit
Admission: RE | Admit: 2017-09-11 | Discharge: 2017-09-11 | Disposition: A | Discharge status: Home / Self Care | Payer: Medicare HMO | Source: Ambulatory Visit | Attending: Radiation Oncology | Admitting: Radiation Oncology

## 2017-09-11 ENCOUNTER — Encounter: Payer: Self-pay | Admitting: Radiation Oncology

## 2017-09-11 DIAGNOSIS — C541 Malignant neoplasm of endometrium: Secondary | ICD-10-CM

## 2017-09-11 DIAGNOSIS — R5383 Other fatigue: Secondary | ICD-10-CM | POA: Diagnosis not present

## 2017-09-11 DIAGNOSIS — E860 Dehydration: Secondary | ICD-10-CM | POA: Diagnosis not present

## 2017-09-11 DIAGNOSIS — Z79899 Other long term (current) drug therapy: Secondary | ICD-10-CM | POA: Diagnosis not present

## 2017-09-11 NOTE — Progress Notes (Signed)
  Radiation Oncology         [phone] ________________________________  Name: Autumn ZETTLEMOYER MRN: [id number]  Date: 09/11/2017  DOB: May 08, 1941  CC: Josetta Huddle, MD  Hart Rochester, MD  HDR BRACHYTHERAPY NOTE  DIAGNOSIS: 76 Frazier.o. female with Stage III-C high-grade serous carcinoma of the uterus (pT1a pN1)   Simple treatment device note: Patient had construction of her custom vaginal cylinder. She will be treated with a 2.5 cm diameter segmented cylinder. This conforms to her anatomy without undue discomfort.  Vaginal brachytherapy procedure node: The patient was brought to the [facility] suite. Identity was confirmed. All relevant records and images related to the planned course of therapy were reviewed. The patient freely provided informed written consent to proceed with treatment after reviewing the details related to the planned course of therapy. The consent form was witnessed and verified by the simulation staff. Then, the patient was set-up in a stable reproducible supine position for radiation therapy. Pelvic exam revealed the vaginal cuff to be intact. The patient's custom vaginal cylinder was placed in the proximal vagina. This was affixed to the CT/MR stabilization plate to prevent slippage. Patient tolerated the placement well.  Verification simulation note:  A fiducial marker was placed within the vaginal cylinder. An AP and lateral film was then obtained through the pelvis area. This documented accurate position of the vaginal cylinder for treatment.  HDR BRACHYTHERAPY TREATMENT  The remote afterloading device was affixed to the vaginal cylinder by catheter. Patient then proceeded to undergo her third and final high-dose-rate treatment directed at the proximal vagina. The patient was prescribed a dose of 6 gray to be delivered to the mucosal surface. Treatment length was 3 cm. Patient was treated with 1 channel using 7 dwell positions. Treatment time was 264.50 seconds. Iridium  192 was the high-dose-rate source for treatment. The patient tolerated the treatment well. After completion of her therapy, a radiation survey was performed documenting return of the iridium source into the GammaMed safe.   PLAN: The patient has completed HDR brachytherapy and external beam radiation treatment. She will return to radiation oncology clinic for routine followup in one month. She will resume chemotherapy soon under the direction of Dr. Polly Cobia. ________________________________   Blair Promise, PhD, MD  This document serves as a record of services personally performed by Gery Pray, MD. It was created on his behalf by Rae Lips, a trained medical scribe. The creation of this record is based on the scribe's personal observations and the provider's statements to them. This document has been checked and approved by the attending provider.

## 2017-09-11 NOTE — Progress Notes (Signed)
  Radiation Oncology         [phone] ________________________________  Name: Autumn Frazier MRN: [id number]  Date: 08/19/2017  DOB: 06-05-1941  End of Treatment Note  Diagnosis:   76 y.o. female with stage III-C high-grade serous carcinoma of the uterus (pT1a pN1)     Indication for treatment:  Curative       Radiation treatment dates:    1. 07/14/2017 - 08/19/2017 2. 08/27/2017, 09/04/2017, 09/11/2017  Site/dose:    1. The pelvis was treated to 45 Gy in 25 fractions of 1.8 Gy. 2. Brachytherapy Boost: The vaginal cuff was treated 18 Gy in 3 fractions of 6 Gy.  Beams/energy:    1. IMRT // 6XFFF Photon 2. HDR Ir-192 Vaginal Brachytherapy  Narrative: The patient tolerated radiation treatment relatively well.   She experienced mild fatigue and minor urinary and bowel symptoms. She reported some dysuria but denied having frequency. She denied any vaginal or rectal bleeding. She denied any skin irritation to the radiation site. During her course of brachytherapy, she reported having dull pain in her central abdomen that radiates around to her left side. She reported having diarrhea 3-4 times per day which she managed with Imodium. She also reported feeling weak, dizzy, and nauseous two days ago. She denied any associated vomiting. She appeared to show signs of dehydration and proceeded to IV fluids. She reports she is feeling better  after receiving IV fluids.   Plan: The patient has completed radiation treatment. The patient will return to radiation oncology clinic for routine followup in one month. I advised them to call or return sooner if they have any questions or concerns related to their recovery or treatment. She will proceed with additional chemotherapy under the direction of Dr. Polly Cobia.  -----------------------------------  Blair Promise, PhD, MD  This document serves as a record of services personally performed by Gery Pray, MD. It was created on his behalf by Rae Lips, a trained medical scribe. The creation of this record is based on the scribe's personal observations and the provider's statements to them. This document has been checked and approved by the attending provider.

## 2017-09-17 DIAGNOSIS — G629 Polyneuropathy, unspecified: Secondary | ICD-10-CM | POA: Diagnosis not present

## 2017-09-17 DIAGNOSIS — F329 Major depressive disorder, single episode, unspecified: Secondary | ICD-10-CM | POA: Diagnosis not present

## 2017-09-17 DIAGNOSIS — I10 Essential (primary) hypertension: Secondary | ICD-10-CM | POA: Diagnosis not present

## 2017-09-17 DIAGNOSIS — Z5111 Encounter for antineoplastic chemotherapy: Secondary | ICD-10-CM | POA: Diagnosis not present

## 2017-09-17 DIAGNOSIS — E039 Hypothyroidism, unspecified: Secondary | ICD-10-CM | POA: Diagnosis not present

## 2017-09-17 DIAGNOSIS — C541 Malignant neoplasm of endometrium: Secondary | ICD-10-CM | POA: Diagnosis not present

## 2017-09-18 DIAGNOSIS — Z5181 Encounter for therapeutic drug level monitoring: Secondary | ICD-10-CM | POA: Diagnosis not present

## 2017-09-18 DIAGNOSIS — C50912 Malignant neoplasm of unspecified site of left female breast: Secondary | ICD-10-CM | POA: Diagnosis not present

## 2017-09-18 DIAGNOSIS — T451X5D Adverse effect of antineoplastic and immunosuppressive drugs, subsequent encounter: Secondary | ICD-10-CM | POA: Diagnosis not present

## 2017-09-18 DIAGNOSIS — F329 Major depressive disorder, single episode, unspecified: Secondary | ICD-10-CM | POA: Diagnosis not present

## 2017-09-18 DIAGNOSIS — C775 Secondary and unspecified malignant neoplasm of intrapelvic lymph nodes: Secondary | ICD-10-CM | POA: Diagnosis not present

## 2017-09-18 DIAGNOSIS — I10 Essential (primary) hypertension: Secondary | ICD-10-CM | POA: Diagnosis not present

## 2017-09-18 DIAGNOSIS — Z5111 Encounter for antineoplastic chemotherapy: Secondary | ICD-10-CM | POA: Diagnosis not present

## 2017-09-18 DIAGNOSIS — G620 Drug-induced polyneuropathy: Secondary | ICD-10-CM | POA: Diagnosis not present

## 2017-09-18 DIAGNOSIS — G629 Polyneuropathy, unspecified: Secondary | ICD-10-CM | POA: Diagnosis not present

## 2017-09-18 DIAGNOSIS — Z90710 Acquired absence of both cervix and uterus: Secondary | ICD-10-CM | POA: Diagnosis not present

## 2017-09-18 DIAGNOSIS — C541 Malignant neoplasm of endometrium: Secondary | ICD-10-CM | POA: Diagnosis not present

## 2017-09-18 DIAGNOSIS — E039 Hypothyroidism, unspecified: Secondary | ICD-10-CM | POA: Diagnosis not present

## 2017-09-18 DIAGNOSIS — Z79811 Long term (current) use of aromatase inhibitors: Secondary | ICD-10-CM | POA: Diagnosis not present

## 2017-09-24 ENCOUNTER — Telehealth: Payer: Self-pay

## 2017-09-24 NOTE — Telephone Encounter (Signed)
Nutrition  Patient identified on Malnutrition screening report for weight loss and poor appetite.    Called and spoke with husband, patient in bathroom at the time of call and unable to take phone call.  Husband asked that RD call back at later time.    Varshini Arrants B. Zenia Resides, [place], [place] Registered Dietitian [phone] (pager)

## 2017-09-24 NOTE — Progress Notes (Signed)
Nutrition Assessment   Reason for Assessment:   Patient identified on Malnutrition Screening report for weight loss and poor appetite  ASSESSMENT:  77 year old female with carcinoma of uterus and breast cancer.  Patient s/p lumpectomy in 11/2015 followed by radiation and currently on anastrozole.  Patient s/p hysterectomy with bilateral salpingo-oophorectomy on 03/2017 with adjuvant chemotherapy and radiation therapy (completed 12/20).    Spoke with patient via phone today.  Patient reports that diarrhea from radiation really effected nutrition and ability to eat.  Reports that diarrhea has resolved and she has really been focusing on eating better (3 meals per day including snacks between meals).  She also reports that she is drinking boost original daily mixed with ice cream at times. Reports that she really does not want to loose anymore weight.    Medications: reviewed  Labs: reviewed  Anthropometrics:   Height: 66 inches Weight: 148 lb 12.8 oz BMI: 23 Noted 179 in June 24 2017   17 % weight loss in the last 3 months, significant  Reports she does not want to loose any more weight   NUTRITION DIAGNOSIS: Inadequate oral intake related to cancer related treatment side effects (diarrhea) as evidenced by 17 %weight loss in the last 3 months   INTERVENTION:   Discussed ways to increase calories and protein.  Will mail fact sheet to patient. Encouraged small frequent meals. Encouraged boost plus shake with higher calories and protein.  Will mail coupons Contact information mailed to patient and patient knows to contact me with questions or concerns regarding nutrition.     MONITORING, EVALUATION, GOAL: patient will consume adequate calories to prevent further weight loss   NEXT VISIT: as needed.  Patient to contact me  Shadow Schedler B. Zenia Resides, [place], [place] Registered Dietitian [phone] (pager)

## 2017-10-02 DIAGNOSIS — Z5111 Encounter for antineoplastic chemotherapy: Secondary | ICD-10-CM | POA: Diagnosis not present

## 2017-10-02 DIAGNOSIS — D696 Thrombocytopenia, unspecified: Secondary | ICD-10-CM | POA: Diagnosis not present

## 2017-10-02 DIAGNOSIS — C541 Malignant neoplasm of endometrium: Secondary | ICD-10-CM | POA: Diagnosis not present

## 2017-10-02 DIAGNOSIS — I10 Essential (primary) hypertension: Secondary | ICD-10-CM | POA: Diagnosis not present

## 2017-10-02 DIAGNOSIS — G620 Drug-induced polyneuropathy: Secondary | ICD-10-CM | POA: Diagnosis not present

## 2017-10-02 DIAGNOSIS — E039 Hypothyroidism, unspecified: Secondary | ICD-10-CM | POA: Diagnosis not present

## 2017-10-02 DIAGNOSIS — T451X5A Adverse effect of antineoplastic and immunosuppressive drugs, initial encounter: Secondary | ICD-10-CM | POA: Diagnosis not present

## 2017-10-09 DIAGNOSIS — Z79899 Other long term (current) drug therapy: Secondary | ICD-10-CM | POA: Diagnosis not present

## 2017-10-09 DIAGNOSIS — Z5111 Encounter for antineoplastic chemotherapy: Secondary | ICD-10-CM | POA: Diagnosis not present

## 2017-10-09 DIAGNOSIS — T451X5A Adverse effect of antineoplastic and immunosuppressive drugs, initial encounter: Secondary | ICD-10-CM | POA: Diagnosis not present

## 2017-10-09 DIAGNOSIS — C55 Malignant neoplasm of uterus, part unspecified: Secondary | ICD-10-CM | POA: Diagnosis not present

## 2017-10-09 DIAGNOSIS — C541 Malignant neoplasm of endometrium: Secondary | ICD-10-CM | POA: Diagnosis not present

## 2017-10-09 DIAGNOSIS — D6959 Other secondary thrombocytopenia: Secondary | ICD-10-CM | POA: Diagnosis not present

## 2017-10-09 DIAGNOSIS — T451X5D Adverse effect of antineoplastic and immunosuppressive drugs, subsequent encounter: Secondary | ICD-10-CM | POA: Diagnosis not present

## 2017-10-09 DIAGNOSIS — E039 Hypothyroidism, unspecified: Secondary | ICD-10-CM | POA: Diagnosis not present

## 2017-10-09 DIAGNOSIS — Z5181 Encounter for therapeutic drug level monitoring: Secondary | ICD-10-CM | POA: Diagnosis not present

## 2017-10-09 DIAGNOSIS — C775 Secondary and unspecified malignant neoplasm of intrapelvic lymph nodes: Secondary | ICD-10-CM | POA: Diagnosis not present

## 2017-10-09 DIAGNOSIS — G620 Drug-induced polyneuropathy: Secondary | ICD-10-CM | POA: Diagnosis not present

## 2017-10-09 DIAGNOSIS — C50912 Malignant neoplasm of unspecified site of left female breast: Secondary | ICD-10-CM | POA: Diagnosis not present

## 2017-10-09 DIAGNOSIS — D696 Thrombocytopenia, unspecified: Secondary | ICD-10-CM | POA: Diagnosis not present

## 2017-10-09 DIAGNOSIS — Z79811 Long term (current) use of aromatase inhibitors: Secondary | ICD-10-CM | POA: Diagnosis not present

## 2017-10-09 DIAGNOSIS — I10 Essential (primary) hypertension: Secondary | ICD-10-CM | POA: Diagnosis not present

## 2017-10-13 ENCOUNTER — Ambulatory Visit: Payer: Medicare HMO | Admitting: Radiation Oncology

## 2017-10-16 DIAGNOSIS — I10 Essential (primary) hypertension: Secondary | ICD-10-CM | POA: Diagnosis not present

## 2017-10-16 DIAGNOSIS — G620 Drug-induced polyneuropathy: Secondary | ICD-10-CM | POA: Diagnosis not present

## 2017-10-16 DIAGNOSIS — T451X5A Adverse effect of antineoplastic and immunosuppressive drugs, initial encounter: Secondary | ICD-10-CM | POA: Diagnosis not present

## 2017-10-16 DIAGNOSIS — Z5111 Encounter for antineoplastic chemotherapy: Secondary | ICD-10-CM | POA: Diagnosis not present

## 2017-10-16 DIAGNOSIS — C541 Malignant neoplasm of endometrium: Secondary | ICD-10-CM | POA: Diagnosis not present

## 2017-10-16 DIAGNOSIS — E039 Hypothyroidism, unspecified: Secondary | ICD-10-CM | POA: Diagnosis not present

## 2017-10-16 DIAGNOSIS — D696 Thrombocytopenia, unspecified: Secondary | ICD-10-CM | POA: Diagnosis not present

## 2017-10-22 ENCOUNTER — Other Ambulatory Visit: Payer: Self-pay

## 2017-10-22 ENCOUNTER — Ambulatory Visit
Admission: RE | Admit: 2017-10-22 | Discharge: 2017-10-22 | Disposition: A | Discharge status: Home / Self Care | Payer: Medicare HMO | Source: Ambulatory Visit | Attending: Radiation Oncology | Admitting: Radiation Oncology

## 2017-10-22 ENCOUNTER — Encounter: Payer: Self-pay | Admitting: Oncology

## 2017-10-22 VITALS — BP 119/77 | HR 88 | Temp 98.3°F | Resp 18 | Wt 152.6 lb

## 2017-10-22 DIAGNOSIS — G620 Drug-induced polyneuropathy: Secondary | ICD-10-CM | POA: Diagnosis not present

## 2017-10-22 DIAGNOSIS — Z5111 Encounter for antineoplastic chemotherapy: Secondary | ICD-10-CM | POA: Diagnosis not present

## 2017-10-22 DIAGNOSIS — E039 Hypothyroidism, unspecified: Secondary | ICD-10-CM | POA: Diagnosis not present

## 2017-10-22 DIAGNOSIS — Z08 Encounter for follow-up examination after completed treatment for malignant neoplasm: Secondary | ICD-10-CM | POA: Diagnosis not present

## 2017-10-22 DIAGNOSIS — C541 Malignant neoplasm of endometrium: Secondary | ICD-10-CM | POA: Diagnosis not present

## 2017-10-22 DIAGNOSIS — T451X5A Adverse effect of antineoplastic and immunosuppressive drugs, initial encounter: Secondary | ICD-10-CM | POA: Diagnosis not present

## 2017-10-22 DIAGNOSIS — I10 Essential (primary) hypertension: Secondary | ICD-10-CM | POA: Diagnosis not present

## 2017-10-22 DIAGNOSIS — D696 Thrombocytopenia, unspecified: Secondary | ICD-10-CM | POA: Diagnosis not present

## 2017-10-22 HISTORY — DX: Personal history of irradiation: Z92.3

## 2017-10-22 NOTE — Progress Notes (Signed)

## 2017-10-22 NOTE — Progress Notes (Signed)
Radiation Oncology         [phone] ________________________________  Name: Autumn Frazier MRN: [id number]  Date: 10/22/2017  DOB: July 28, 1941  Follow-Up Visit Note  CC: Josetta Huddle, MD  Josetta Huddle, MD    ICD-10-CM   1. Endometrial cancer ([place]) C54.1     Diagnosis:   77 y.o. 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 07/14/2017 - 08/19/2017 2. 08/27/2017, 09/04/2017, 09/11/2017  Site/dose:    1. The pelvis was treated to 45 Gy in 25 fractions of 1.8 Gy. 2. Brachytherapy Boost: The vaginal cuff was treated 18 Gy in 3 fractions of 6 Gy.  Beams/energy:    1. IMRT // 6XFFF Photon 2. HDR Ir-192 Vaginal Brachytherapy    Narrative:  The patient returns today for routine follow-up.   Pt denies having any pain. Pt states that she hs mild fatigue. Pt denies having any issues with diarrhea. Pt denies having any nausea or vomiting. Pt denies having any bladder/urinary issues. Pt denies having any vaginal or rectal bleeding. Pt denies having any skin changes. Pt states that she is doing well.  She completed an additional cycle of chemotherapy after her pelvic radiation therapy and vaginal brachytherapy. She has been unable to receive her fifth and sixth cycle  due to low platelet counts. She did undergo blood work today to see if she would be a candidate for her next cycle of chemotherapy.                            ALLERGIES:  has No Known Allergies.  Meds: Current Outpatient Medications  Medication Sig Dispense Refill  . acetaminophen (TYLENOL) 500 MG tablet Take 500 mg by mouth every 6 (six) hours as needed.    Marland Kitchen anastrozole (ARIMIDEX) 1 MG tablet TAKE 1 TABLET(1 MG) BY MOUTH DAILY 90 tablet 0  . b complex vitamins tablet Take 1 tablet by mouth daily.    Marland Kitchen escitalopram (LEXAPRO) 10 MG tablet Take by mouth.    . Fluticasone-Salmeterol (ADVAIR) 100-50 MCG/DOSE  AEPB Inhale 1 puff into the lungs every 12 (twelve) hours. 60 each 1  . gabapentin (NEURONTIN) 300 MG capsule TK ONE C PO  HS.  2  . Levothyroxine Sodium 88 MCG CAPS Patient is taking 50mcg daily  3  . ondansetron (ZOFRAN) 8 MG tablet TK 1 T PO  Q 8 H PRF NAUSEA.  1  . prochlorperazine (COMPAZINE) 10 MG tablet Take by mouth.    . Vitamin D, Ergocalciferol, (DRISDOL) 50000 units CAPS capsule Take 50,000 Units by mouth every 7 (seven) days.    . Biotin 5000 MCG TABS Take by mouth.    Marland Kitchen LORazepam (ATIVAN) 0.5 MG tablet Take 1 tablet (0.5 mg total) by mouth every 8 (eight) hours. As needed for anxiety or nausea (Patient not taking: Reported on 08/27/2017) 30 tablet 0  . Melatonin 5 MG TBDP Take by mouth.     No current facility-administered medications for this encounter.    Facility-Administered Medications Ordered in Other Encounters  Medication Dose Route Frequency Provider Last Rate Last Dose  . sodium chloride flush (NS) 0.9 % injection 10 mL  10 mL Intravenous PRN Gery Pray, MD   10 mL at 09/09/17 1351    Physical Findings: The patient is in no acute distress. Patient  is alert and oriented.  weight is 152 lb 9.6 oz (69.2 kg). Her oral temperature is 98.3 F (36.8 C). Her blood pressure is 119/77 and her pulse is 88. Her respiration is 18 and oxygen saturation is 98%. .  Lungs are clear to auscultation bilaterally. Heart has regular rate and rhythm. No palpable cervical, supraclavicular, or axillary adenopathy. Abdomen soft, non-tender, normal bowel sounds. Pelvic exam not performed in light of recent completion of treatment.  Lab Findings: Lab Results  Component Value Date   WBC 3.6 (L) 09/09/2017   HGB 11.5 (L) 09/09/2017   HCT 36.2 09/09/2017   MCV 102.5 (H) 09/09/2017   PLT 115 (L) 09/09/2017    Radiographic Findings: No results found.  Impression:  Patient seems to have recovered well from her radiation treatments. She will proceed with additional chemotherapy pending  blood work today.   Plan:  Routine follow-up in 5 months. Patient will likely see Dr. Polly Cobia in 2 months for examination and as above will complete her last 2 cycles of chemotherapy in the near future. Today the patient was given a vaginal dilator and instructions on its use in light of her pelvic radiation and vaginal brachytherapy treatments.  ____________________________________ Gery Pray, MD

## 2017-10-22 NOTE — Progress Notes (Signed)
Pt here for a 1 month follow up for uterus carcinoma. Pt denies having any pain. Pt states that she hs mild fatigue. Pt denies having any issues with diarrhea. Pt denies having any nausea or vomiting. Pt denies having any bladder/urinary issues. Pt denies having any vaginal or rectal bleeding. Pt denies having any skin changes. Pt states that she is doing well.  BP 119/77 (BP Location: Right Arm)   Pulse 88   Temp 98.3 F (36.8 C) (Oral)   Resp 18   Wt 152 lb 9.6 oz (69.2 kg)   SpO2 98%   BMI 24.26 kg/m   Wt Readings from Last 3 Encounters:  10/22/17 152 lb 9.6 oz (69.2 kg)  09/09/17 148 lb 12.8 oz (67.5 kg)  08/27/17 153 lb (69.4 kg)

## 2017-10-23 ENCOUNTER — Ambulatory Visit: Payer: Medicare HMO | Admitting: Radiation Oncology

## 2017-10-23 DIAGNOSIS — Z5111 Encounter for antineoplastic chemotherapy: Secondary | ICD-10-CM | POA: Diagnosis not present

## 2017-10-23 DIAGNOSIS — T451X5A Adverse effect of antineoplastic and immunosuppressive drugs, initial encounter: Secondary | ICD-10-CM | POA: Diagnosis not present

## 2017-10-23 DIAGNOSIS — G620 Drug-induced polyneuropathy: Secondary | ICD-10-CM | POA: Diagnosis not present

## 2017-10-23 DIAGNOSIS — E039 Hypothyroidism, unspecified: Secondary | ICD-10-CM | POA: Diagnosis not present

## 2017-10-23 DIAGNOSIS — C541 Malignant neoplasm of endometrium: Secondary | ICD-10-CM | POA: Diagnosis not present

## 2017-10-23 DIAGNOSIS — I10 Essential (primary) hypertension: Secondary | ICD-10-CM | POA: Diagnosis not present

## 2017-10-23 DIAGNOSIS — D696 Thrombocytopenia, unspecified: Secondary | ICD-10-CM | POA: Diagnosis not present

## 2017-10-28 ENCOUNTER — Telehealth: Payer: Self-pay | Admitting: *Deleted

## 2017-10-28 NOTE — Telephone Encounter (Signed)
CALLED PATIENT TO INFORM OF FU APPT. ON 03-19-18 @ 10 AM WITH DR. KINARD, SPOKE WITH PATIENT AND HE IS AWARE OF THIS APPT.

## 2017-11-06 DIAGNOSIS — G629 Polyneuropathy, unspecified: Secondary | ICD-10-CM | POA: Diagnosis not present

## 2017-11-06 DIAGNOSIS — E039 Hypothyroidism, unspecified: Secondary | ICD-10-CM | POA: Diagnosis not present

## 2017-11-06 DIAGNOSIS — C541 Malignant neoplasm of endometrium: Secondary | ICD-10-CM | POA: Diagnosis not present

## 2017-11-06 DIAGNOSIS — T451X5A Adverse effect of antineoplastic and immunosuppressive drugs, initial encounter: Secondary | ICD-10-CM | POA: Diagnosis not present

## 2017-11-06 DIAGNOSIS — I10 Essential (primary) hypertension: Secondary | ICD-10-CM | POA: Diagnosis not present

## 2017-11-06 DIAGNOSIS — F329 Major depressive disorder, single episode, unspecified: Secondary | ICD-10-CM | POA: Diagnosis not present

## 2017-11-06 DIAGNOSIS — D6959 Other secondary thrombocytopenia: Secondary | ICD-10-CM | POA: Diagnosis not present

## 2017-11-06 DIAGNOSIS — Z5111 Encounter for antineoplastic chemotherapy: Secondary | ICD-10-CM | POA: Diagnosis not present

## 2017-11-12 DIAGNOSIS — Z5111 Encounter for antineoplastic chemotherapy: Secondary | ICD-10-CM | POA: Diagnosis not present

## 2017-11-12 DIAGNOSIS — C541 Malignant neoplasm of endometrium: Secondary | ICD-10-CM | POA: Diagnosis not present

## 2017-11-13 DIAGNOSIS — C55 Malignant neoplasm of uterus, part unspecified: Secondary | ICD-10-CM | POA: Diagnosis not present

## 2017-11-13 DIAGNOSIS — Z5181 Encounter for therapeutic drug level monitoring: Secondary | ICD-10-CM | POA: Diagnosis not present

## 2017-11-13 DIAGNOSIS — D6959 Other secondary thrombocytopenia: Secondary | ICD-10-CM | POA: Diagnosis not present

## 2017-11-13 DIAGNOSIS — T451X5A Adverse effect of antineoplastic and immunosuppressive drugs, initial encounter: Secondary | ICD-10-CM | POA: Diagnosis not present

## 2017-11-13 DIAGNOSIS — T451X5D Adverse effect of antineoplastic and immunosuppressive drugs, subsequent encounter: Secondary | ICD-10-CM | POA: Diagnosis not present

## 2017-11-13 DIAGNOSIS — I427 Cardiomyopathy due to drug and external agent: Secondary | ICD-10-CM | POA: Diagnosis not present

## 2017-11-13 DIAGNOSIS — I10 Essential (primary) hypertension: Secondary | ICD-10-CM | POA: Diagnosis not present

## 2017-11-13 DIAGNOSIS — F329 Major depressive disorder, single episode, unspecified: Secondary | ICD-10-CM | POA: Diagnosis not present

## 2017-11-13 DIAGNOSIS — G620 Drug-induced polyneuropathy: Secondary | ICD-10-CM | POA: Diagnosis not present

## 2017-11-13 DIAGNOSIS — C772 Secondary and unspecified malignant neoplasm of intra-abdominal lymph nodes: Secondary | ICD-10-CM | POA: Diagnosis not present

## 2017-11-13 DIAGNOSIS — E039 Hypothyroidism, unspecified: Secondary | ICD-10-CM | POA: Diagnosis not present

## 2017-11-13 DIAGNOSIS — Z79811 Long term (current) use of aromatase inhibitors: Secondary | ICD-10-CM | POA: Diagnosis not present

## 2017-11-13 DIAGNOSIS — Z5111 Encounter for antineoplastic chemotherapy: Secondary | ICD-10-CM | POA: Diagnosis not present

## 2017-11-13 DIAGNOSIS — G629 Polyneuropathy, unspecified: Secondary | ICD-10-CM | POA: Diagnosis not present

## 2017-11-13 DIAGNOSIS — C50912 Malignant neoplasm of unspecified site of left female breast: Secondary | ICD-10-CM | POA: Diagnosis not present

## 2017-11-13 DIAGNOSIS — C541 Malignant neoplasm of endometrium: Secondary | ICD-10-CM | POA: Diagnosis not present

## 2017-11-16 ENCOUNTER — Other Ambulatory Visit: Payer: Self-pay | Admitting: Oncology

## 2017-11-17 ENCOUNTER — Encounter: Payer: Self-pay | Admitting: Sports Medicine

## 2017-11-17 ENCOUNTER — Ambulatory Visit (INDEPENDENT_AMBULATORY_CARE_PROVIDER_SITE_OTHER): Payer: Medicare HMO

## 2017-11-17 ENCOUNTER — Ambulatory Visit: Payer: Medicare HMO | Admitting: Sports Medicine

## 2017-11-17 VITALS — BP 138/88 | HR 95 | Ht 66.5 in | Wt 151.6 lb

## 2017-11-17 DIAGNOSIS — M5136 Other intervertebral disc degeneration, lumbar region: Secondary | ICD-10-CM

## 2017-11-17 DIAGNOSIS — M8580 Other specified disorders of bone density and structure, unspecified site: Secondary | ICD-10-CM

## 2017-11-17 DIAGNOSIS — C541 Malignant neoplasm of endometrium: Secondary | ICD-10-CM

## 2017-11-17 DIAGNOSIS — D4959 Neoplasm of unspecified behavior of other genitourinary organ: Secondary | ICD-10-CM

## 2017-11-17 DIAGNOSIS — M179 Osteoarthritis of knee, unspecified: Secondary | ICD-10-CM | POA: Diagnosis not present

## 2017-11-17 DIAGNOSIS — M25552 Pain in left hip: Secondary | ICD-10-CM | POA: Diagnosis not present

## 2017-11-17 DIAGNOSIS — Z170 Estrogen receptor positive status [ER+]: Secondary | ICD-10-CM | POA: Diagnosis not present

## 2017-11-17 DIAGNOSIS — S3992XA Unspecified injury of lower back, initial encounter: Secondary | ICD-10-CM | POA: Diagnosis not present

## 2017-11-17 DIAGNOSIS — C50512 Malignant neoplasm of lower-outer quadrant of left female breast: Secondary | ICD-10-CM

## 2017-11-17 DIAGNOSIS — M545 Low back pain: Secondary | ICD-10-CM | POA: Diagnosis not present

## 2017-11-17 MED ORDER — DIAZEPAM 5 MG PO TABS: ORAL_TABLET | ORAL | 0 refills | Status: DC

## 2017-11-17 MED ORDER — TRAMADOL HCL 50 MG PO TABS: 50.0000 mg | ORAL_TABLET | Freq: Four times a day (QID) | ORAL | 0 refills | Status: DC | PRN

## 2017-11-17 NOTE — Progress Notes (Signed)
[name]. [place] [facility], [place] [facility] [place]  Autumn Frazier - 77 y.o. female MRN [id number]  Date of birth: 03-09-1941  Visit Date: 11/17/2017  PCP: Josetta Huddle, MD   Referred by: Josetta Huddle, MD   Scribe for today's visit: Wendy Poet, LAT, ATC     SUBJECTIVE:  Autumn Frazier "Autumn Frazier" is here for New Patient (Initial Visit) (L hip pain) .   Her L hip pain symptoms INITIALLY: Began about a week ago w/ no known MOI.  States that she fell about 5-6 days prior to her L hip hurting and isn't sure if that's related. Described as 7/10 intense pain at it's worst but has no pain at rest , nonradiating Worsened with weight bearing Improved with rest and non-weight bearing activities Additional associated symptoms include: no popping or clicking and no N/T into the L LE    At this time symptoms show no change compared to onset. She has taken both Aleve and Tylenol and IBU.  She has also been using some medicated hot patches.   ROS Denies night time disturbances. Denies fevers, chills, or night sweats. Denies unexplained weight loss. Reports personal history of cancer.   Uterine cancer - current.  Prior breast cancer. Denies changes in bowel or bladder habits. Reports recent unreported falls. Denies new or worsening dyspnea or wheezing. Denies headaches or dizziness.  Denies numbness, tingling or weakness  In the extremities.  Reports dizziness or presyncopal episodes.  Yes but due to current course of chemotherapy. Denies lower extremity edema     HISTORY & PERTINENT PRIOR DATA:  Prior History reviewed and updated per electronic medical record.  Significant history, findings, studies and interim changes include:  reports that she quit smoking about 35 years ago. Her smoking use included cigarettes. she has never used smokeless tobacco. No results for input(s): HGBA1C, LABURIC, CREATINE in the last 8760 hours. No specialty comments  available. Problem  Other Intervertebral Disc Degeneration, Lumbar Region  Endometrial Cancer ([facility])    OBJECTIVE:  VS:  HT:5' 6.5" (168.9 cm)   WT:151 lb 9.6 oz (68.8 kg)  BMI:24.11    BP:138/88  HR:95bpm  TEMP: ( )  RESP:98 %   PHYSICAL EXAM: Constitutional: WDWN, Non-toxic appearing. Psychiatric: Alert & appropriately interactive.  Not depressed or anxious appearing. Respiratory: No increased work of breathing.  Trachea Midline Eyes: Pupils are equal.  EOM intact without nystagmus.  No scleral icterus  NEUROVASCULAR exam: No clubbing or cyanosis appreciated No significant venous stasis changes Capillary Refill: normal, less than 2 seconds   Exam back and hip: She has no focal midline pain or significant pain with palpation of the lumbosacral junction.  She has great internal and external range of motion of bilateral hips.  No pain with straight leg raise.  She is unstable with ambulation and uses a cane to help with the antalgic gait that she has.  She is able to perform 1 legged stand bilaterally for very short period of time.  Her lower extremity strength is 4/5 in all myotomes and her sensation is intact light touch..  She has no pain with palpation of the greater sciatic notch or with palpation of the popliteal space.  She has a postsurgical incision in the low lumbar region that is well-healed without significant surrounding skin changes.  ASSESSMENT & PLAN:   1. Left hip pain   2. Other intervertebral disc degeneration, lumbar region   3. 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 Osteopenia determined by x-ray   5. Endometrial cancer ([place])   6. Malignant neoplasm of lower-outer quadrant of left breast of female, estrogen receptor positive ([place])    ++++++++++++++++++++++++++++++++++++++++++++ Orders & Meds:  Orders Placed This Encounter  Procedures  . DG HIP UNILAT W OR W/O PELVIS 2-3 VIEWS LEFT  . DG Lumbar Spine Complete  . MR LUMBAR SPINE  W WO CONTRAST  . MR Pelvis W Wo Contrast    Meds ordered this encounter  Medications  . traMADol (ULTRAM) 50 MG tablet    Sig: Take 1 tablet (50 mg total) by mouth every 6 (six) hours as needed for moderate pain.    Dispense:  30 tablet    Refill:  0    ++++++++++++++++++++++++++++++++++++++++++++ PLAN:   Findings:  This is a complicated case.  Upon my own review of the x-rays of her lumbar spine and pelvis and hip she does have significant underlying degenerative changes as well as significant osteopenia that makes interpretation of her plain film x-rays difficult.  There is an irregularity within the left L5-S1 facet but I am concerned that may be an acute insufficiency fracture versus significant spondylosis.  Additionally within the L3 and L4 anterior aspects of the vertebral bodies.  This is sclerotic in nature with overall well-maintained cortical margins however invasive disease cannot be ruled out.  I do agree that she has some mild degenerative changes of her bilateral hips however this does not seem to correlate with her symptoms given that she has great range of motion and no significant pain with Stinchfield testing, FADIR or FABER testing.  Given the patient's history of radiation, endometrial cancer and breast cancer further advanced imaging is warranted at this time and after discussing the case personally with Dr. Genia Del we will set her up for an MRI of the lumbar spine and pelvis with and without contrast.  She will need some sedation for this and will call in Valium to be taken 1 hour prior for her.  Additionally given the pain that is significantly interfering with her day-to-day activities a low dose of tramadol was provided to see if this provides her any type of pain relief while trying to minimize side effects including increased risk of falling and oversedation.  These risks were discussed in detail with her today.  Additionally she will need an open MRI due to  issues with claustrophobia in the past but hopefully this coupled with the Valium will be sufficient for pain relief.  I would like to try to set this up in the next several days and will plan to follow-up with her after further information is obtained.   No problem-specific Assessment & Plan notes found for this encounter.   Follow-up: Return for results review after diagnostic testing, MRI review in person.   Pertinent documentation may be included in additional procedure notes, imaging studies, problem based documentation and patient instructions. Please see these sections of the encounter for additional information regarding this visit. CMA/ATC served as Education administrator during this visit. History, Physical, and Plan performed by medical provider. Documentation and orders reviewed and attested to.      Gerda Diss, [facility] Physician

## 2017-11-17 NOTE — Progress Notes (Signed)
Patient does have chronic changes as well as concern for potential acute component based on my note.  Please see associated note from today's visit.

## 2017-11-18 ENCOUNTER — Telehealth: Payer: Self-pay

## 2017-11-18 NOTE — Telephone Encounter (Signed)
Call pt and advise that Dr. Paulla Fore talked to oncologist about MRI and they are in agreement that MRI is preferred over CT. MRI was ordered as open MRI. Rx has been sent in for pt to take prior to MRI.

## 2017-11-18 NOTE — Telephone Encounter (Signed)
MRI order has been placed and Diazepam has been sent to pt preferred pharmacy. Called pt and advised. Pt verbalized understanding and will contact the office if she does not hear from [facility] about scheduling her appointment.

## 2017-11-24 ENCOUNTER — Telehealth: Payer: Self-pay | Admitting: Sports Medicine

## 2017-11-24 MED ORDER — CARISOPRODOL 350 MG PO TABS: 350.0000 mg | ORAL_TABLET | Freq: Every evening | ORAL | 0 refills | Status: DC | PRN

## 2017-11-24 MED ORDER — DICLOFENAC SODIUM 75 MG PO TBEC: 75.0000 mg | DELAYED_RELEASE_TABLET | Freq: Two times a day (BID) | ORAL | 0 refills | Status: DC | PRN

## 2017-11-24 NOTE — Telephone Encounter (Signed)
Copied from [place]. Topic: Quick Communication - See Telephone Encounter >> Nov 24, 2017  3:50 PM Cleaster Corin, NT wrote: CRM for notification. See Telephone encounter for:   11/24/17. Pt. Calling to speak with Dr. Paulla Fore or nurse concerning a new med. Pt. Did not state name of med. Pt. Also stated that this is the second time she has called and no one has returned her call. Let pt. Know that it can take up to 24-48 hours for a callback pt. Stated that she would like to speak to someone TODAY. Cb number for pt. [phone]

## 2017-11-24 NOTE — Telephone Encounter (Signed)
Patient is calling and states she would like to start diclofenac and soma. She states she does not have her MRI until Sat. 11/29/17. She states tramadol does not help her besides makes her go to sleep. She would like for Dr. Paulla Fore to give her a call so she can disgust in further detail why she would like to start these. Please advise.   [phone]   [facility] - [place], [place] - [address]  [address] [place] [address]  Phone: [phone] Fax: [phone]

## 2017-11-24 NOTE — Telephone Encounter (Signed)
See note

## 2017-11-24 NOTE — Telephone Encounter (Signed)
Persistent pain.  Autumn Frazier does have increased risk of falls in the elderly and this was communicated to her and she voices understanding.  Has been taking Soma qhs and reports good tolerability. Will Rx Short course and diclofenac to be taken BID prn.  D/c Tramadol and start above meds.  MRI pending

## 2017-11-24 NOTE — Telephone Encounter (Signed)
Forwarding to Dr. Rigby to advise.  

## 2017-11-24 NOTE — Telephone Encounter (Signed)
Dr. Paulla Fore, see prior note and please advise.

## 2017-11-24 NOTE — Addendum Note (Signed)
Addended by: Teresa Coombs D on: 11/24/2017 05:09 PM   Modules accepted: Orders

## 2017-11-25 ENCOUNTER — Emergency Department (HOSPITAL_COMMUNITY): Payer: Medicare HMO

## 2017-11-25 ENCOUNTER — Telehealth: Payer: Self-pay | Admitting: Sports Medicine

## 2017-11-25 ENCOUNTER — Encounter (HOSPITAL_COMMUNITY): Payer: Self-pay | Admitting: Internal Medicine

## 2017-11-25 ENCOUNTER — Emergency Department (HOSPITAL_COMMUNITY)
Admission: EM | Admit: 2017-11-25 | Discharge: 2017-11-25 | Disposition: A | Discharge status: Home / Self Care | Payer: Medicare HMO | Attending: Emergency Medicine | Admitting: Emergency Medicine

## 2017-11-25 DIAGNOSIS — Z8542 Personal history of malignant neoplasm of other parts of uterus: Secondary | ICD-10-CM | POA: Insufficient documentation

## 2017-11-25 DIAGNOSIS — Z87891 Personal history of nicotine dependence: Secondary | ICD-10-CM | POA: Diagnosis not present

## 2017-11-25 DIAGNOSIS — Z79899 Other long term (current) drug therapy: Secondary | ICD-10-CM | POA: Insufficient documentation

## 2017-11-25 DIAGNOSIS — Y929 Unspecified place or not applicable: Secondary | ICD-10-CM | POA: Diagnosis not present

## 2017-11-25 DIAGNOSIS — M25552 Pain in left hip: Secondary | ICD-10-CM | POA: Diagnosis not present

## 2017-11-25 DIAGNOSIS — Z853 Personal history of malignant neoplasm of breast: Secondary | ICD-10-CM | POA: Insufficient documentation

## 2017-11-25 DIAGNOSIS — Y999 Unspecified external cause status: Secondary | ICD-10-CM | POA: Insufficient documentation

## 2017-11-25 DIAGNOSIS — X58XXXA Exposure to other specified factors, initial encounter: Secondary | ICD-10-CM | POA: Insufficient documentation

## 2017-11-25 DIAGNOSIS — Y939 Activity, unspecified: Secondary | ICD-10-CM | POA: Insufficient documentation

## 2017-11-25 DIAGNOSIS — S3210XA Unspecified fracture of sacrum, initial encounter for closed fracture: Secondary | ICD-10-CM | POA: Diagnosis not present

## 2017-11-25 DIAGNOSIS — R52 Pain, unspecified: Secondary | ICD-10-CM | POA: Diagnosis not present

## 2017-11-25 DIAGNOSIS — M25559 Pain in unspecified hip: Secondary | ICD-10-CM | POA: Diagnosis not present

## 2017-11-25 MED ORDER — ACETAMINOPHEN 325 MG PO TABS
650.0000 mg | ORAL_TABLET | Freq: Once | ORAL | Status: AC
  Administered 2017-11-25: 650 mg via ORAL
  Filled 2017-11-25: qty 2

## 2017-11-25 MED ORDER — HYDROCODONE-ACETAMINOPHEN 5-325 MG PO TABS: 1.0000 | ORAL_TABLET | Freq: Four times a day (QID) | ORAL | 0 refills | Status: DC | PRN

## 2017-11-25 NOTE — ED Notes (Signed)
ED Provider at bedside. 

## 2017-11-25 NOTE — ED Triage Notes (Signed)
Pt arrived to [facility] via [facility] from home c/o left hip pain. Pt denies falling or trauma to cause the pain. PCP ordered x-ray of left hip and it came back inconclusive. MRI scheduled for next week per patient. Pt has hx of uterine cancer and finished chemotherapy 2 weeks ago. Given 70mcg fentanyl by EMS.

## 2017-11-25 NOTE — Telephone Encounter (Signed)
Spoke to pt and she gave verbal permission to speak to her daughter Autumn Frazier regarding her medical condition.  I informed the daughter that Dr. Paulla Fore spoke to the radiologist and that he would like Autumn Frazier to still get her MRI this Saturday.  The daughter states that she would like Dr. Paulla Fore to give her a call later to discuss the need for the MRI.

## 2017-11-25 NOTE — Discharge Instructions (Signed)
You may bear weight with a walker.  However, try to minimize the amount of time you are walking or on your feet.  We do want you to walk at least once per day.  Otherwise, rest by lying down or sitting.  If your pain becomes out of control despite the hydrocodone, see your orthopedist or return here for evaluation.  Otherwise, follow-up with Dr. Lorin Mercy in 1 week.  Call for an appointment.

## 2017-11-25 NOTE — ED Notes (Signed)
Bed: IE33 Expected date:  Expected time:  Means of arrival:  Comments: EMS-hip pain/cancer patient

## 2017-11-25 NOTE — Telephone Encounter (Signed)
See note.  Copied from [place] [phone]. Topic: General - Other >> Nov 25, 2017  1:42 PM Yvette Rack wrote: Reason for CRM: patient daughter Autumn Frazier [phone] calling to let Dr Paulla Fore know that her Mother was in the hospital today and they did a CT on Mother and found out that she has a FX sacrum so they want to cancell the MRI scheduled for Saturday they would like a call back today to let them know that appt has been cancelled you can call pt at home and her daughter will be there for patient to authorize for you to give daughter Autumn Frazier information on her Mother

## 2017-11-25 NOTE — ED Provider Notes (Signed)
[facility] DEPT Provider Note   CSN: [id number] Arrival date & time: 11/25/17  0932     History   Chief Complaint Chief Complaint  Patient presents with  . Hip Pain    HPI Autumn Frazier is a 77 y.o. female.  HPI  77 year old female with a history of breast cancer and endometrial cancer who recently finished chemotherapy presents with left hip pain.  She states the pain is mostly lateral.  This is been ongoing for about 2-1/2-3 weeks.  A little less than a week before the pain started she does remember having a fall but does not remember specifically injuring her hip and was able to ambulate without difficulty.  Now is having hip pain, mostly when standing.  At first she was taking Tylenol.  She saw her PCP about 8 days ago where x-rays were ordered and she was told that it showed arthritis and was otherwise "inconclusive".  She has an MRI scheduled for 3/9.  She denies any weakness or numbness in her extremity.  Over the last 24 hours the pain seems to be worsening, to the point that when she stood up to go to the bathroom this morning she almost passed out due to how severe the pain became.  She was given IV fentanyl by EMS.  She is feeling dizzy from the fentanyl but otherwise the pain seems to be better.  When lying at rest she has no complaints and no pain.  She has normal range of motion of her hip. No back pain or radiation of the pain.  She was given tramadol by her PCP which she states gave some mild relief.  She took some of her friend's diclofenac which she is not sure if it helped at all.  Past Medical History:  Diagnosis Date  . Breast cancer ([place])   . Breast cancer of lower-outer quadrant of left female breast ([place]) 11/02/2015  . Endometrial cancer ([place])   . History of brachytherapy 08/27/17-09/11/17   vaginal cuff 18 Gy in 3 fractions  . History of radiation therapy 07/14/2017-08/19/17   pelvis 45 Gy in 25 fractions  . Hx of radiation therapy  01/09/16- 02/06/16   Left Breast    Patient Active Problem List   Diagnosis Date Noted  . Other intervertebral disc degeneration, lumbar region 11/17/2017  . Endometrial cancer ([place]) 07/02/2017  . Osteopenia determined by x-ray 03/18/2016  . Genetic testing 12/08/2015  . Malignant neoplasm of lower-outer quadrant of left breast of female, estrogen receptor positive ([place]) 11/02/2015    Past Surgical History:  Procedure Laterality Date  . BREAST BIOPSY Left 11/06/2015   [place]  . BREAST LUMPECTOMY Left   . herniated disc    . RADIOACTIVE SEED GUIDED PARTIAL MASTECTOMY WITH AXILLARY SENTINEL LYMPH NODE BIOPSY Left 11/27/2015   Procedure: LEFT BREAST SEED AND WIRE GUIDED LUMPECTOMY WITH LEFT AXILLARY LYMPH NODE BIOPSY;  Surgeon: Rolm Bookbinder, MD;  Location: [facility];  Service: General;  Laterality: Left;  . ROBOTIC ASSISTED TOTAL HYSTERECTOMY WITH BILATERAL SALPINGO OOPHERECTOMY  04/01/2017   ROBOTIC HYSTERECTOMY WITH BSO, SENTINEL NODE MAPPING AND D&C by Dr. Polly Cobia  . TONSILLECTOMY      OB History    No data available       Home Medications    Prior to Admission medications   Medication Sig Start Date End Date Taking? Authorizing Provider  anastrozole (ARIMIDEX) 1 MG tablet TAKE 1 TABLET(1 MG) BY MOUTH DAILY 11/17/17  Yes Magrinat,  Virgie Dad, MD  b complex vitamins tablet Take 1 tablet by mouth daily.   Yes [provider]  carisoprodol (SOMA) 350 MG tablet Take 1 tablet (350 mg total) by mouth at bedtime as needed for muscle spasms. 11/24/17  Yes Gerda Diss, DO  cholecalciferol (VITAMIN D) 1000 units tablet Take 1,000 Units by mouth daily.   Yes [provider]  diclofenac (VOLTAREN) 75 MG EC tablet Take 1 tablet (75 mg total) by mouth 2 (two) times daily as needed. Patient taking differently: Take 75 mg by mouth 2 (two) times daily as needed for mild pain.  11/24/17  Yes Gerda Diss, DO  escitalopram (LEXAPRO) 20 MG tablet Take 20 mg by  mouth daily.  11/07/17  Yes [provider]  Fluticasone-Salmeterol (ADVAIR) 100-50 MCG/DOSE AEPB Inhale 1 puff into the lungs every 12 (twelve) hours. 08/17/12  Yes Norins, Heinz Knuckles, MD  gabapentin (NEURONTIN) 300 MG capsule take 300mg  by mouth at bedtime 06/27/17  Yes [provider]  Levothyroxine Sodium 88 MCG CAPS take 72mcg by mouth daily 11/09/15  Yes [provider]  traMADol (ULTRAM) 50 MG tablet Take 1 tablet (50 mg total) by mouth every 6 (six) hours as needed for moderate pain. 11/17/17  Yes Gerda Diss, DO  acetaminophen (TYLENOL) 500 MG tablet Take 500 mg by mouth every 6 (six) hours as needed.    [provider]  Biotin 5000 MCG TABS Take 5,000 mcg by mouth daily.     [provider]  diazepam (VALIUM) 5 MG tablet Take 1 tablet by mouth 1 hour prior to MRI.  Okay to repeat x1 if incomplete relief of anxiety associated with procedure 11/17/17   Gerda Diss, DO  HYDROcodone-acetaminophen [facility]) 5-325 MG tablet Take 1 tablet by mouth every 6 (six) hours as needed for severe pain. 11/25/17   Sherwood Gambler, MD  LORazepam (ATIVAN) 0.5 MG tablet Take 1 tablet (0.5 mg total) by mouth every 8 (eight) hours. As needed for anxiety or nausea Patient not taking: Reported on 11/25/2017 07/15/17   Gery Pray, MD    Family History Family History  Problem Relation Age of Onset  . Brain cancer Sister   . Ovarian cancer Maternal Aunt     Social History Social History   Tobacco Use  . Smoking status: Former Smoker    Types: Cigarettes    Last attempt to quit: 07/02/1982    Years since quitting: 35.4  . Smokeless tobacco: Never Used  Substance Use Topics  . Alcohol use: Yes    Alcohol/week: 1.8 oz    Types: 3 Glasses of wine per week  . Drug use: No     Allergies   Patient has no known allergies.   Review of Systems Review of Systems  Constitutional: Negative for fever.  Musculoskeletal: Positive for arthralgias. Negative for  back pain.  Neurological: Negative for weakness and numbness.  All other systems reviewed and are negative.    Physical Exam Updated Vital Signs BP (!) 146/81   Pulse 83   Temp 98.5 F (36.9 C) (Oral)   Resp 16   SpO2 95%   Physical Exam  Constitutional: She is oriented to person, place, and time. She appears well-developed and well-nourished. No distress.  HENT:  Head: Normocephalic and atraumatic.  Right Ear: External ear normal.  Left Ear: External ear normal.  Nose: Nose normal.  Eyes: Right eye exhibits no discharge. Left eye exhibits no discharge.  Cardiovascular: Normal rate,  regular rhythm and normal heart sounds.  Pulses:      Dorsalis pedis pulses are 2+ on the left side.  Pulmonary/Chest: Effort normal and breath sounds normal.  Abdominal: Soft. She exhibits no distension. There is no tenderness.  Musculoskeletal:       Left hip: She exhibits normal range of motion (active and passive), no tenderness, no bony tenderness and no swelling.       Left upper leg: She exhibits no tenderness.  Patient is able to stand and ambulate.  This is painful but she is able to bear weight.  She walks from the stretcher to the door and then back.  Neurological: She is alert and oriented to person, place, and time.  5/5 strength in BLE, normal gross sensation  Skin: Skin is warm and dry. She is not diaphoretic.  Nursing note and vitals reviewed.    ED Treatments / Results  Labs (all labs ordered are listed, but only abnormal results are displayed) Labs Reviewed - No data to display  EKG  EKG Interpretation None       Radiology Ct Pelvis Wo Contrast  Result Date: 11/25/2017 CLINICAL DATA:  Left hip pain for several weeks following fall EXAM: CT PELVIS WITHOUT CONTRAST TECHNIQUE: Multidetector CT imaging of the pelvis was performed following the standard protocol without intravenous contrast. COMPARISON:  11/17/2017 FINDINGS: Urinary Tract:  Bladder is well distended.  Bowel: Diverticulosis without evidence of diverticulitis. No obstructive changes are seen. The appendix is not well visualized and may have been surgically removed. Vascular/Lymphatic: Vascular calcifications are noted without aneurysmal dilatation. No significant lymphadenopathy is noted. Reproductive:  The uterus has been surgically removed. Other: No significant free pelvic fluid is noted. No herniation is seen. Musculoskeletal: Degenerative changes of the lumbar spine are noted. Degenerative changes of the hip joints are noted bilaterally. Bilateral sacral fractures are identified with mild displacement. The extent of the left sacral fracture is somewhat greater than that on the right. This is likely the etiology of the patient's underlying discomfort. IMPRESSION: Bilateral sacral fractures. No definitive hip fracture is seen. Degenerative changes of the hip joints. Electronically Signed   By: Inez Catalina M.D.   On: 11/25/2017 11:20    Procedures Procedures (including critical care time)  Medications Ordered in ED Medications  acetaminophen (TYLENOL) tablet 650 mg (650 mg Oral Given 11/25/17 1214)     Initial Impression / Assessment and Plan / ED Course  I have reviewed the triage vital signs and the nursing notes.  Pertinent labs & imaging results that were available during my care of the patient were reviewed by me and considered in my medical decision making (see chart for details).     Patient and family are asking for emergent MRI given the worsening pain.  Given she is able to bear weight this is not indicated in the ED but a CT scan was obtained given continued and worsening pain.  This shows bilateral sacral fractures with minimal displacement.  I discussed her case with Dr. Lorin Mercy of orthopedics who recommends walker when walking but to otherwise limit walking except for at least once per day.  We discussed this and need for orthopedic follow-up in about 1 week.  She prefers not to have  narcotics but agrees to have some at home for breakthrough pain.  Discussed trying to use the diclofenac given by PCP and Tylenol and if still having breakthrough pain then try hydrocodone.  Otherwise she is neurovascular intact.  The hips were  unremarkable on CT.  Discharge home with return precautions.  Final Clinical Impressions(s) / ED Diagnoses   Final diagnoses:  Closed fracture of sacrum, unspecified portion of sacrum, initial encounter [facility])    ED Discharge Orders        Ordered    HYDROcodone-acetaminophen (NORCO) 5-325 MG tablet  Every 6 hours PRN     11/25/17 1157       Sherwood Gambler, MD 11/25/17 1526

## 2017-11-26 NOTE — Telephone Encounter (Signed)
Spoke with the patient's daughter.  After they spoke with their Oncologist's office they understand that the MRI will still be beneficial for both prognostic and therapeutic purposes.  Can consider potential sacral kyphoplasty if MR is reassuring that no metastatic process is driving this fracture.  Mechanism of injury does not entirely fit with the presentation of symptoms given that the "fall/slip" occurred 6-7 days prior to the onset of any injury.  If worsening pain or incomplete relief with rest and analgesics can consider IR referral pending MRI results

## 2017-11-29 ENCOUNTER — Ambulatory Visit
Admission: RE | Admit: 2017-11-29 | Discharge: 2017-11-29 | Disposition: A | Discharge status: Home / Self Care | Payer: Medicare HMO | Source: Ambulatory Visit | Attending: Sports Medicine | Admitting: Sports Medicine

## 2017-11-29 DIAGNOSIS — S3210XA Unspecified fracture of sacrum, initial encounter for closed fracture: Secondary | ICD-10-CM | POA: Diagnosis not present

## 2017-11-29 DIAGNOSIS — M48061 Spinal stenosis, lumbar region without neurogenic claudication: Secondary | ICD-10-CM | POA: Diagnosis not present

## 2017-11-29 DIAGNOSIS — M5136 Other intervertebral disc degeneration, lumbar region: Secondary | ICD-10-CM

## 2017-11-29 DIAGNOSIS — D4959 Neoplasm of unspecified behavior of other genitourinary organ: Secondary | ICD-10-CM

## 2017-11-29 DIAGNOSIS — M5126 Other intervertebral disc displacement, lumbar region: Secondary | ICD-10-CM | POA: Diagnosis not present

## 2017-12-01 DIAGNOSIS — M8448XA Pathological fracture, other site, initial encounter for fracture: Secondary | ICD-10-CM | POA: Diagnosis not present

## 2017-12-01 DIAGNOSIS — Z79811 Long term (current) use of aromatase inhibitors: Secondary | ICD-10-CM | POA: Diagnosis not present

## 2017-12-01 DIAGNOSIS — C541 Malignant neoplasm of endometrium: Secondary | ICD-10-CM | POA: Diagnosis not present

## 2017-12-01 DIAGNOSIS — N281 Cyst of kidney, acquired: Secondary | ICD-10-CM | POA: Diagnosis not present

## 2017-12-01 DIAGNOSIS — F329 Major depressive disorder, single episode, unspecified: Secondary | ICD-10-CM | POA: Diagnosis not present

## 2017-12-01 DIAGNOSIS — Z79899 Other long term (current) drug therapy: Secondary | ICD-10-CM | POA: Diagnosis not present

## 2017-12-01 DIAGNOSIS — T451X5D Adverse effect of antineoplastic and immunosuppressive drugs, subsequent encounter: Secondary | ICD-10-CM | POA: Diagnosis not present

## 2017-12-01 DIAGNOSIS — Z5181 Encounter for therapeutic drug level monitoring: Secondary | ICD-10-CM | POA: Diagnosis not present

## 2017-12-01 DIAGNOSIS — C50912 Malignant neoplasm of unspecified site of left female breast: Secondary | ICD-10-CM | POA: Diagnosis not present

## 2017-12-01 DIAGNOSIS — K449 Diaphragmatic hernia without obstruction or gangrene: Secondary | ICD-10-CM | POA: Diagnosis not present

## 2017-12-01 DIAGNOSIS — C55 Malignant neoplasm of uterus, part unspecified: Secondary | ICD-10-CM | POA: Diagnosis not present

## 2017-12-01 DIAGNOSIS — G620 Drug-induced polyneuropathy: Secondary | ICD-10-CM | POA: Diagnosis not present

## 2017-12-01 NOTE — Progress Notes (Signed)
Acute S1/S2 insufficiency fractures seen as expected.  No evidence of metastatic process. Can consider Sacralplasty if still having significant pain. Has f/u scheduled with Dr. Lorin Mercy on 12/02/17.  Okay to defer to him if they would prefer or I'm happy to see her back.   Please call and inform

## 2017-12-02 ENCOUNTER — Inpatient Hospital Stay (INDEPENDENT_AMBULATORY_CARE_PROVIDER_SITE_OTHER): Payer: Self-pay | Admitting: Orthopaedic Surgery

## 2017-12-04 DIAGNOSIS — G629 Polyneuropathy, unspecified: Secondary | ICD-10-CM | POA: Diagnosis not present

## 2017-12-04 DIAGNOSIS — Z5111 Encounter for antineoplastic chemotherapy: Secondary | ICD-10-CM | POA: Diagnosis not present

## 2017-12-04 DIAGNOSIS — T451X5A Adverse effect of antineoplastic and immunosuppressive drugs, initial encounter: Secondary | ICD-10-CM | POA: Diagnosis not present

## 2017-12-04 DIAGNOSIS — C55 Malignant neoplasm of uterus, part unspecified: Secondary | ICD-10-CM | POA: Diagnosis not present

## 2017-12-04 DIAGNOSIS — I427 Cardiomyopathy due to drug and external agent: Secondary | ICD-10-CM | POA: Diagnosis not present

## 2017-12-09 ENCOUNTER — Other Ambulatory Visit: Payer: Self-pay | Admitting: Sports Medicine

## 2017-12-10 ENCOUNTER — Ambulatory Visit: Payer: Medicare HMO | Admitting: Sports Medicine

## 2017-12-10 ENCOUNTER — Encounter: Payer: Self-pay | Admitting: Sports Medicine

## 2017-12-10 VITALS — BP 130/76 | HR 82 | Ht 66.5 in | Wt 152.0 lb

## 2017-12-10 DIAGNOSIS — M5136 Other intervertebral disc degeneration, lumbar region: Secondary | ICD-10-CM

## 2017-12-10 DIAGNOSIS — R269 Unspecified abnormalities of gait and mobility: Secondary | ICD-10-CM | POA: Diagnosis not present

## 2017-12-10 DIAGNOSIS — S32110A Nondisplaced Zone I fracture of sacrum, initial encounter for closed fracture: Secondary | ICD-10-CM | POA: Diagnosis not present

## 2017-12-10 DIAGNOSIS — M8080XA Other osteoporosis with current pathological fracture, unspecified site, initial encounter for fracture: Secondary | ICD-10-CM

## 2017-12-10 DIAGNOSIS — C541 Malignant neoplasm of endometrium: Secondary | ICD-10-CM

## 2017-12-10 NOTE — Progress Notes (Signed)
   [name]. [place], [place] at [place]  Lucita Ferrara - 77 y.o. female MRN [id number]  Date of birth: 1941/02/03  Visit Date: 12/10/2017  PCP: Josetta Huddle, MD   Referred by: Josetta Huddle, MD  Please see additional documentation for HPI, review of systems.  HISTORY & PERTINENT PRIOR DATA:  Prior History reviewed and updated per electronic medical record.  Significant history, findings, studies and interim changes include:  reports that she quit smoking about 35 years ago. Her smoking use included cigarettes. She has never used smokeless tobacco. No results for input(s): HGBA1C, LABURIC, CREATINE in the last 8760 hours. No specialty comments available. Problem  Other Intervertebral Disc Degeneration, Lumbar Region   MRI l-spine and pelvis 11/29/17 - Acute S1/S2 insufficiency fractures seen as expected. No evidence of metastatic process.   Osteoporosis   S1/S2 fragility fracture Feb 2019     OBJECTIVE:  VS:  HT:5' 6.5" (168.9 cm)   WT:152 lb (68.9 kg)  BMI:24.17    BP:130/76  HR:82bpm  TEMP: ( )  RESP:98 %   PHYSICAL EXAM: WDWN, Non-toxic appearing. Psychiatric: Alert & appropriately interactive.  Not depressed or anxious appearing. Respiratory: No increased work of breathing.  Trachea Midline Eyes: Pupils are equal.  EOM intact without nystagmus.  No scleral icterus  NEUROVASCULAR exam: No clubbing or cyanosis appreciated No significant venous stasis changes normal, less than 2 seconds  .  Patient walks with a wide-based gait.  She has instability with ambulation On a Rollator at this time.  She has minimal pain with direct palpation over the sacrum and lower spine but there is focal tenderness at the superior portion of the sacrum.  Her sit/stand test is approximately 4 seconds.  She has good internal and external rotation of bilateral hips.  Negative straight leg raise bilaterally.  ASSESSMENT & PLAN:    1. Endometrial cancer ([place])   2. Other intervertebral disc degeneration, lumbar region   3. Closed nondisplaced zone I fracture of sacrum, initial encounter ([place])   4. Gait disturbance   5. Other osteoporosis with current pathological fracture, initial encounter    Orders & Meds: home health PT  No orders of the defined types were placed in this encounter.   PLAN: Additionally she has difficulty walking so home health PT will be ordered and paperwork for in-home long-term assistance was filled out given the expected ongoing difficulties with ambulation for the next several months.     Other intervertebral disc degeneration, lumbar region Moderate degenerative change appreciated on the MRI however the most focal findings are the compression fractures.  This was reviewed with her today.    Osteoporosis Patient did have Fosamax prescribed however given the debatable literature on bisphosphonates and acute fracture setting my preference is for her to hold this until at least the 6-week mark.  We will plan to see her back at the 6-week mark and have her begin this medicine at this time.  She may also be a candidate for Prolia.  Will defer to her PCP for decisions regarding immediate initiation versus delayed as outlined above.  I did discuss this in great detail with the patient today and she is agreeable and prefers to hold off on it as well.  Absolutely though she does need to begin this once this is past the acute phase.   Follow-up: Return in about 3 weeks (around 12/31/2017).

## 2017-12-10 NOTE — Progress Notes (Signed)
  Autumn Frazier - 77 y.o. female MRN [id number]  Date of birth: 1941/01/16  Scribe for today's visit: Josepha Pigg, CMA     SUBJECTIVE:  Autumn Frazier "Autumn Frazier" is here for Follow-up (L hip pain)  11/17/17: Her L hip pain, sacral pain symptoms INITIALLY: Began about a week ago w/ no known MOI.  States that she fell about 5-6 days prior to her L hip hurting and isn't sure if that's related. Described as 7/10 intense pain at it's worst but has no pain at rest , nonradiating Worsened with weight bearing Improved with rest and non-weight bearing activities Additional associated symptoms include: no popping or clicking and no N/T into the L LE At this time symptoms show no change compared to onset. She has taken both Aleve and Tylenol and IBU.  She has also been using some medicated hot patches.  12/10/17: Compared to the last office visit, her previously described symptoms are improving. She has a lot of pain after lying for prolonged periods of time.  Current symptoms are mild & are radiating to the left thigh and at times into the L calf.  She was prescribed Tramadol, Hydrocodone, and Soma but she hasn't been taking them. She has been taking tylenol extra strength and diclofenac with some relief. She has been icing the area TID with temporarily relief. She has been ambulating with either a walker or a cane, she prefers the cane.   ROS Reports night time disturbances. Denies fevers, chills, or night sweats. Denies unexplained weight loss. Reports personal history of cancer. Denies changes in bowel or bladder habits. Denies recent unreported falls. Denies new or worsening dyspnea or wheezing. Denies headaches or dizziness.  Denies numbness, tingling or weakness  In the extremities.  Reports dizziness or presyncopal episodes, chronic and unchanged however Denies lower extremity edema      Please see additional documentation for Objective, Assessment and Plan sections. Pertinent additional  documentation may be included in corresponding procedure notes, imaging studies, problem based documentation and patient instructions. Please see these sections of the encounter for additional information regarding this visit.  CMA/ATC served as Education administrator during this visit. History, Physical, and Plan performed by medical provider. Documentation and orders reviewed and attested to.      Gerda Diss, [place] [facility] Physician

## 2017-12-11 NOTE — Assessment & Plan Note (Signed)
Moderate degenerative change appreciated on the MRI however the most focal findings are the compression fractures.  This was reviewed with her today.

## 2017-12-11 NOTE — Assessment & Plan Note (Signed)
Patient did have Fosamax prescribed however given the debatable literature on bisphosphonates and acute fracture setting my preference is for her to hold this until at least the 6-week mark.  We will plan to see her back at the 6-week mark and have her begin this medicine at this time.  She may also be a candidate for Prolia.  Will defer to her PCP for decisions regarding immediate initiation versus delayed as outlined above.  I did discuss this in great detail with the patient today and she is agreeable and prefers to hold off on it as well.  Absolutely though she does need to begin this once this is past the acute phase.

## 2017-12-15 DIAGNOSIS — M8088XD Other osteoporosis with current pathological fracture, vertebra(e), subsequent encounter for fracture with routine healing: Secondary | ICD-10-CM | POA: Diagnosis not present

## 2017-12-15 DIAGNOSIS — M5136 Other intervertebral disc degeneration, lumbar region: Secondary | ICD-10-CM | POA: Diagnosis not present

## 2017-12-15 DIAGNOSIS — Z7951 Long term (current) use of inhaled steroids: Secondary | ICD-10-CM | POA: Diagnosis not present

## 2017-12-15 DIAGNOSIS — C541 Malignant neoplasm of endometrium: Secondary | ICD-10-CM | POA: Diagnosis not present

## 2017-12-15 DIAGNOSIS — C50512 Malignant neoplasm of lower-outer quadrant of left female breast: Secondary | ICD-10-CM | POA: Diagnosis not present

## 2017-12-15 DIAGNOSIS — Z87891 Personal history of nicotine dependence: Secondary | ICD-10-CM | POA: Diagnosis not present

## 2017-12-16 ENCOUNTER — Telehealth: Payer: Self-pay | Admitting: Sports Medicine

## 2017-12-16 NOTE — Telephone Encounter (Signed)
See note

## 2017-12-16 NOTE — Telephone Encounter (Signed)
Called Pramod and gave verbal order for PT 2 week 4.

## 2017-12-16 NOTE — Telephone Encounter (Signed)
Copied from [place] [phone]. Topic: Quick Communication - See Telephone Encounter >> Dec 16, 2017 10:43 AM Cleaster Corin, NT wrote: CRM for notification. See Telephone encounter for: 12/16/17. Pramod calling from [facility] to receive verbal orders for PT   2 week 4 ( for balancing and gait training) Pramod can be reached at [phone] can leave a vm

## 2017-12-24 DIAGNOSIS — C541 Malignant neoplasm of endometrium: Secondary | ICD-10-CM | POA: Diagnosis not present

## 2017-12-24 DIAGNOSIS — Z87891 Personal history of nicotine dependence: Secondary | ICD-10-CM | POA: Diagnosis not present

## 2017-12-24 DIAGNOSIS — M5136 Other intervertebral disc degeneration, lumbar region: Secondary | ICD-10-CM | POA: Diagnosis not present

## 2017-12-24 DIAGNOSIS — Z7951 Long term (current) use of inhaled steroids: Secondary | ICD-10-CM | POA: Diagnosis not present

## 2017-12-24 DIAGNOSIS — C50512 Malignant neoplasm of lower-outer quadrant of left female breast: Secondary | ICD-10-CM | POA: Diagnosis not present

## 2017-12-24 DIAGNOSIS — M8088XD Other osteoporosis with current pathological fracture, vertebra(e), subsequent encounter for fracture with routine healing: Secondary | ICD-10-CM | POA: Diagnosis not present

## 2017-12-25 DIAGNOSIS — Z79899 Other long term (current) drug therapy: Secondary | ICD-10-CM | POA: Diagnosis not present

## 2017-12-25 DIAGNOSIS — E039 Hypothyroidism, unspecified: Secondary | ICD-10-CM | POA: Diagnosis not present

## 2017-12-25 DIAGNOSIS — F329 Major depressive disorder, single episode, unspecified: Secondary | ICD-10-CM | POA: Diagnosis not present

## 2017-12-25 DIAGNOSIS — G629 Polyneuropathy, unspecified: Secondary | ICD-10-CM | POA: Diagnosis not present

## 2017-12-25 DIAGNOSIS — Z5181 Encounter for therapeutic drug level monitoring: Secondary | ICD-10-CM | POA: Diagnosis not present

## 2017-12-25 DIAGNOSIS — R102 Pelvic and perineal pain: Secondary | ICD-10-CM | POA: Diagnosis not present

## 2017-12-25 DIAGNOSIS — Z5111 Encounter for antineoplastic chemotherapy: Secondary | ICD-10-CM | POA: Diagnosis not present

## 2017-12-25 DIAGNOSIS — I10 Essential (primary) hypertension: Secondary | ICD-10-CM | POA: Diagnosis not present

## 2017-12-25 DIAGNOSIS — C541 Malignant neoplasm of endometrium: Secondary | ICD-10-CM | POA: Diagnosis not present

## 2017-12-29 DIAGNOSIS — Z7951 Long term (current) use of inhaled steroids: Secondary | ICD-10-CM | POA: Diagnosis not present

## 2017-12-29 DIAGNOSIS — M8088XD Other osteoporosis with current pathological fracture, vertebra(e), subsequent encounter for fracture with routine healing: Secondary | ICD-10-CM | POA: Diagnosis not present

## 2017-12-29 DIAGNOSIS — M5136 Other intervertebral disc degeneration, lumbar region: Secondary | ICD-10-CM | POA: Diagnosis not present

## 2017-12-29 DIAGNOSIS — C50512 Malignant neoplasm of lower-outer quadrant of left female breast: Secondary | ICD-10-CM | POA: Diagnosis not present

## 2017-12-29 DIAGNOSIS — C541 Malignant neoplasm of endometrium: Secondary | ICD-10-CM | POA: Diagnosis not present

## 2017-12-29 DIAGNOSIS — Z87891 Personal history of nicotine dependence: Secondary | ICD-10-CM | POA: Diagnosis not present

## 2017-12-31 ENCOUNTER — Encounter: Payer: Self-pay | Admitting: Sports Medicine

## 2017-12-31 ENCOUNTER — Ambulatory Visit: Payer: Medicare HMO | Admitting: Sports Medicine

## 2017-12-31 VITALS — BP 112/74 | HR 88 | Ht 66.5 in | Wt 150.4 lb

## 2017-12-31 DIAGNOSIS — C541 Malignant neoplasm of endometrium: Secondary | ICD-10-CM | POA: Diagnosis not present

## 2017-12-31 DIAGNOSIS — S32110A Nondisplaced Zone I fracture of sacrum, initial encounter for closed fracture: Secondary | ICD-10-CM | POA: Diagnosis not present

## 2017-12-31 DIAGNOSIS — R269 Unspecified abnormalities of gait and mobility: Secondary | ICD-10-CM

## 2017-12-31 DIAGNOSIS — M5136 Other intervertebral disc degeneration, lumbar region: Secondary | ICD-10-CM | POA: Diagnosis not present

## 2017-12-31 DIAGNOSIS — M8080XA Other osteoporosis with current pathological fracture, unspecified site, initial encounter for fracture: Secondary | ICD-10-CM | POA: Diagnosis not present

## 2017-12-31 DIAGNOSIS — D4959 Neoplasm of unspecified behavior of other genitourinary organ: Secondary | ICD-10-CM

## 2017-12-31 NOTE — Progress Notes (Signed)
Autumn Frazier. Autumn Frazier, Autumn Frazier  Autumn Frazier - 77 y.o. Frazier MRN [id number]  Date of birth: Jan 19, 1941  Visit Date: 12/31/2017  PCP: Autumn Huddle, MD   Referred by: Autumn Huddle, MD  Scribe for today's visit: Autumn Frazier, CMA     SUBJECTIVE:  Autumn Frazier "Autumn Frazier" is here for Follow-up (sacral fx)  11/17/17: Her L hip pain, sacral pain symptoms INITIALLY: Began about a week ago w/ no known MOI.  States that she fell about 5-6 days prior to her L hip hurting and isn't sure if that's related. Described as 7/10 intense pain at it's worst but has no pain at rest , nonradiating Worsened with weight bearing Improved with rest and non-weight bearing activities Additional associated symptoms include: no popping or clicking and no N/T into the L LE At this time symptoms show no change compared to onset. She has taken both Aleve and Tylenol and IBU.  She has also been using some medicated hot patches.  12/10/17: Compared to the last office visit, her previously described symptoms are improving. She has a lot of pain after lying for prolonged periods of time.  Current symptoms are mild & are radiating to the left thigh and at times into the L calf.  She was prescribed Tramadol, Hydrocodone, and Soma but she hasn't been taking them. She has been taking tylenol extra strength and diclofenac with some relief. She has been icing the area TID with temporarily relief. She has been ambulating with either a walker or a cane, she prefers the cane.   12/31/2017: Compared to the last office visit, her previously described symptoms are improving. She has some pain in the R groin, thinks she pulled a muscle while doing PT.  Current symptoms are mild & are radiating to L thigh and calf but this has been less severe and only occurs first thing in the morning.  She has been taking tylenol ES and Diclofenac. She has been doing PT 2 x weekly,  hoping to increase to 3 x weekly.   ROS Denies night time disturbances. Denies fevers, chills, or night sweats. Denies unexplained weight loss. Reports personal history of cancer. Denies changes in bowel or bladder habits. Denies recent unreported falls. Denies new or worsening dyspnea or wheezing. Denies headaches or dizziness.  Denies numbness, tingling or weakness  In the extremities.  Denies dizziness or presyncopal episodes Denies lower extremity edema    HISTORY & PERTINENT PRIOR DATA:  Prior History reviewed and updated per electronic medical record.  Significant/pertinent history, findings, studies include:  reports that she quit smoking about 35 years ago. Her smoking use included cigarettes. She has never used smokeless tobacco. No results for input(s): HGBA1C, LABURIC, CREATINE in the last 8760 hours. No specialty comments available. No problems updated.  OBJECTIVE:  VS:  HT:5' 6.5" (168.9 cm)   WT:150 lb 6.4 oz (68.2 kg)  BMI:23.91    BP:112/74  HR:88bpm  TEMP: ( )  RESP:95 %   PHYSICAL EXAM: Constitutional: WDWN, Non-toxic appearing. Psychiatric: Alert & appropriately interactive.  Not depressed or anxious appearing. Respiratory: No increased work of breathing.  Trachea Midline Eyes: Pupils are equal.  EOM intact without nystagmus.  No scleral icterus  Vascular Exam: warm to touch no edema  lower extremity neuro exam: unremarkable normal strength normal sensation normal reflexes  MSK Exam: No focal tenderness palpation along the paraspinal or sacral spine.  She has hesitation to  get up and go test and still relies on the walker but is able to stand steadily on her own feet.  She does have a crouched forward gait relying on the walker with wide-based stance.   ASSESSMENT & PLAN:   1. Endometrial cancer ([place])   2. Other intervertebral disc degeneration, lumbar region   3. Closed nondisplaced zone I fracture of sacrum, initial encounter ([place])   4.  Gait disturbance   5. Other osteoporosis with current pathological fracture, initial encounter   6. 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  Continue with home health physical therapy.  Progress activities as tolerated.  I like to see her get off of the walker over the next several weeks working with physical therapy with a cautioned on increased risk of falling.  If any worsening symptoms she will call but we will plan to see her back in 6 weeks and discuss resuming bisphosphonate treatment at that time.  Follow-up: Return in about 6 weeks (around 02/11/2018) for repeat clinical exam.      Please see additional documentation for Objective, Assessment and Plan sections. Pertinent additional documentation may be included in corresponding procedure notes, imaging studies, problem based documentation and patient instructions. Please see these sections of the encounter for additional information regarding this visit.  CMA/ATC served as Education administrator during this visit. History, Physical, and Plan performed by medical provider. Documentation and orders reviewed and attested to.      Gerda Diss, [place] [facility] Physician

## 2018-01-01 DIAGNOSIS — M8088XD Other osteoporosis with current pathological fracture, vertebra(e), subsequent encounter for fracture with routine healing: Secondary | ICD-10-CM | POA: Diagnosis not present

## 2018-01-01 DIAGNOSIS — C50512 Malignant neoplasm of lower-outer quadrant of left female breast: Secondary | ICD-10-CM | POA: Diagnosis not present

## 2018-01-01 DIAGNOSIS — Z7951 Long term (current) use of inhaled steroids: Secondary | ICD-10-CM | POA: Diagnosis not present

## 2018-01-01 DIAGNOSIS — Z87891 Personal history of nicotine dependence: Secondary | ICD-10-CM | POA: Diagnosis not present

## 2018-01-01 DIAGNOSIS — M5136 Other intervertebral disc degeneration, lumbar region: Secondary | ICD-10-CM | POA: Diagnosis not present

## 2018-01-01 DIAGNOSIS — C541 Malignant neoplasm of endometrium: Secondary | ICD-10-CM | POA: Diagnosis not present

## 2018-01-05 ENCOUNTER — Ambulatory Visit
Admission: RE | Admit: 2018-01-05 | Discharge: 2018-01-05 | Disposition: A | Discharge status: Home / Self Care | Payer: Medicare HMO | Source: Ambulatory Visit | Attending: Oncology | Admitting: Oncology

## 2018-01-05 ENCOUNTER — Telehealth: Payer: Self-pay | Admitting: Sports Medicine

## 2018-01-05 DIAGNOSIS — R928 Other abnormal and inconclusive findings on diagnostic imaging of breast: Secondary | ICD-10-CM | POA: Diagnosis not present

## 2018-01-05 DIAGNOSIS — Z87891 Personal history of nicotine dependence: Secondary | ICD-10-CM | POA: Diagnosis not present

## 2018-01-05 DIAGNOSIS — Z7951 Long term (current) use of inhaled steroids: Secondary | ICD-10-CM | POA: Diagnosis not present

## 2018-01-05 DIAGNOSIS — C541 Malignant neoplasm of endometrium: Secondary | ICD-10-CM | POA: Diagnosis not present

## 2018-01-05 DIAGNOSIS — C50512 Malignant neoplasm of lower-outer quadrant of left female breast: Secondary | ICD-10-CM

## 2018-01-05 DIAGNOSIS — M8580 Other specified disorders of bone density and structure, unspecified site: Secondary | ICD-10-CM

## 2018-01-05 DIAGNOSIS — M5136 Other intervertebral disc degeneration, lumbar region: Secondary | ICD-10-CM | POA: Diagnosis not present

## 2018-01-05 DIAGNOSIS — Z170 Estrogen receptor positive status [ER+]: Principal | ICD-10-CM

## 2018-01-05 DIAGNOSIS — M8088XD Other osteoporosis with current pathological fracture, vertebra(e), subsequent encounter for fracture with routine healing: Secondary | ICD-10-CM | POA: Diagnosis not present

## 2018-01-05 HISTORY — DX: Personal history of irradiation: Z92.3

## 2018-01-05 HISTORY — DX: Personal history of antineoplastic chemotherapy: Z92.21

## 2018-01-05 NOTE — Telephone Encounter (Signed)
Copied from [place] [phone]. Topic: Quick Communication - See Telephone Encounter >> Jan 05, 2018  8:50 AM Ether Griffins B wrote: CRM for notification. See Telephone encounter for: 01/05/18.  Pt requesting nitroglycerin patches for her fracture. Pt will be out of the house most of the morning. She said it is ok to leave VM on home machine.

## 2018-01-05 NOTE — Telephone Encounter (Signed)
Spoke with patient and she advised that she was told by a couple of her friends that Dr. Paulla Fore has prescribed Nitro patches for them and they thought she would try it. Will forward to Dr. Paulla Fore to see if appropriate.

## 2018-01-05 NOTE — Telephone Encounter (Signed)
Spoke with patient and advised that Dr. Paulla Fore does not feel that Nitroglycerin Patches would be beneficial for her injury. She wanted to know if there were any other alternatives that should could try.

## 2018-01-05 NOTE — Telephone Encounter (Signed)
Called and left VM for pt to call the office.  

## 2018-01-07 DIAGNOSIS — Z7951 Long term (current) use of inhaled steroids: Secondary | ICD-10-CM | POA: Diagnosis not present

## 2018-01-07 DIAGNOSIS — M8088XD Other osteoporosis with current pathological fracture, vertebra(e), subsequent encounter for fracture with routine healing: Secondary | ICD-10-CM | POA: Diagnosis not present

## 2018-01-07 DIAGNOSIS — C50512 Malignant neoplasm of lower-outer quadrant of left female breast: Secondary | ICD-10-CM | POA: Diagnosis not present

## 2018-01-07 DIAGNOSIS — C541 Malignant neoplasm of endometrium: Secondary | ICD-10-CM | POA: Diagnosis not present

## 2018-01-07 DIAGNOSIS — Z87891 Personal history of nicotine dependence: Secondary | ICD-10-CM | POA: Diagnosis not present

## 2018-01-07 DIAGNOSIS — M5136 Other intervertebral disc degeneration, lumbar region: Secondary | ICD-10-CM | POA: Diagnosis not present

## 2018-01-08 DIAGNOSIS — F329 Major depressive disorder, single episode, unspecified: Secondary | ICD-10-CM | POA: Diagnosis not present

## 2018-01-08 DIAGNOSIS — N289 Disorder of kidney and ureter, unspecified: Secondary | ICD-10-CM | POA: Diagnosis not present

## 2018-01-08 DIAGNOSIS — Z79899 Other long term (current) drug therapy: Secondary | ICD-10-CM | POA: Diagnosis not present

## 2018-01-08 DIAGNOSIS — M8448XD Pathological fracture, other site, subsequent encounter for fracture with routine healing: Secondary | ICD-10-CM | POA: Diagnosis not present

## 2018-01-08 DIAGNOSIS — T451X5D Adverse effect of antineoplastic and immunosuppressive drugs, subsequent encounter: Secondary | ICD-10-CM | POA: Diagnosis not present

## 2018-01-08 DIAGNOSIS — G620 Drug-induced polyneuropathy: Secondary | ICD-10-CM | POA: Diagnosis not present

## 2018-01-08 DIAGNOSIS — R102 Pelvic and perineal pain: Secondary | ICD-10-CM | POA: Diagnosis not present

## 2018-01-08 DIAGNOSIS — D6959 Other secondary thrombocytopenia: Secondary | ICD-10-CM | POA: Diagnosis not present

## 2018-01-08 DIAGNOSIS — G629 Polyneuropathy, unspecified: Secondary | ICD-10-CM | POA: Diagnosis not present

## 2018-01-08 DIAGNOSIS — C50912 Malignant neoplasm of unspecified site of left female breast: Secondary | ICD-10-CM | POA: Diagnosis not present

## 2018-01-08 DIAGNOSIS — Z79811 Long term (current) use of aromatase inhibitors: Secondary | ICD-10-CM | POA: Diagnosis not present

## 2018-01-08 DIAGNOSIS — E039 Hypothyroidism, unspecified: Secondary | ICD-10-CM | POA: Diagnosis not present

## 2018-01-08 DIAGNOSIS — C541 Malignant neoplasm of endometrium: Secondary | ICD-10-CM | POA: Diagnosis not present

## 2018-01-08 DIAGNOSIS — I10 Essential (primary) hypertension: Secondary | ICD-10-CM | POA: Diagnosis not present

## 2018-01-08 DIAGNOSIS — Z5181 Encounter for therapeutic drug level monitoring: Secondary | ICD-10-CM | POA: Diagnosis not present

## 2018-01-08 DIAGNOSIS — Z5111 Encounter for antineoplastic chemotherapy: Secondary | ICD-10-CM | POA: Diagnosis not present

## 2018-01-14 ENCOUNTER — Telehealth: Payer: Self-pay | Admitting: Internal Medicine

## 2018-01-14 DIAGNOSIS — C541 Malignant neoplasm of endometrium: Secondary | ICD-10-CM | POA: Diagnosis not present

## 2018-01-14 DIAGNOSIS — Z7951 Long term (current) use of inhaled steroids: Secondary | ICD-10-CM | POA: Diagnosis not present

## 2018-01-14 DIAGNOSIS — M8088XD Other osteoporosis with current pathological fracture, vertebra(e), subsequent encounter for fracture with routine healing: Secondary | ICD-10-CM | POA: Diagnosis not present

## 2018-01-14 DIAGNOSIS — C50512 Malignant neoplasm of lower-outer quadrant of left female breast: Secondary | ICD-10-CM | POA: Diagnosis not present

## 2018-01-14 DIAGNOSIS — Z87891 Personal history of nicotine dependence: Secondary | ICD-10-CM | POA: Diagnosis not present

## 2018-01-14 DIAGNOSIS — M5136 Other intervertebral disc degeneration, lumbar region: Secondary | ICD-10-CM | POA: Diagnosis not present

## 2018-01-14 NOTE — Telephone Encounter (Signed)
See note

## 2018-01-14 NOTE — Telephone Encounter (Signed)
Called and spoke to pt.  She states that she's doing better overall but reports that her R groin is still bothering her.  She reports that she con't to work w/ a home health PT and that they are going to take it easy for the next few days in terms of exercises.  She states that she doesn't feel like the Voltaren is helping much and notes that she has a few pills left.  We discuss having her stop taking the Voltaren over the next few days to see if she notices any difference in her symptoms and then she'll still have some of the medication left if she needs it over the weekend.  Pt states that if she realizes that she needs more of the Voltaren, then she'll call us at the beginning of the week.

## 2018-01-14 NOTE — Telephone Encounter (Signed)
Copied from [place] #[id number]. Topic: Quick Communication - See Telephone Encounter >> Jan 14, 2018  1:04 PM Percell Belt A wrote: CRM for notification. See Telephone encounter for: 01/14/18. Pt called in and would like to know if she needs to keep taking the diclofenac (VOLTAREN) 75 MG EC tablet [[id number]] ?   If so she needs a refill today ?     Best number [phone]

## 2018-01-15 DIAGNOSIS — Z79899 Other long term (current) drug therapy: Secondary | ICD-10-CM | POA: Diagnosis not present

## 2018-01-15 DIAGNOSIS — R102 Pelvic and perineal pain: Secondary | ICD-10-CM | POA: Diagnosis not present

## 2018-01-15 DIAGNOSIS — F329 Major depressive disorder, single episode, unspecified: Secondary | ICD-10-CM | POA: Diagnosis not present

## 2018-01-15 DIAGNOSIS — Z5111 Encounter for antineoplastic chemotherapy: Secondary | ICD-10-CM | POA: Diagnosis not present

## 2018-01-15 DIAGNOSIS — Z5181 Encounter for therapeutic drug level monitoring: Secondary | ICD-10-CM | POA: Diagnosis not present

## 2018-01-15 DIAGNOSIS — G629 Polyneuropathy, unspecified: Secondary | ICD-10-CM | POA: Diagnosis not present

## 2018-01-15 DIAGNOSIS — E039 Hypothyroidism, unspecified: Secondary | ICD-10-CM | POA: Diagnosis not present

## 2018-01-15 DIAGNOSIS — I10 Essential (primary) hypertension: Secondary | ICD-10-CM | POA: Diagnosis not present

## 2018-01-15 DIAGNOSIS — C541 Malignant neoplasm of endometrium: Secondary | ICD-10-CM | POA: Diagnosis not present

## 2018-01-16 ENCOUNTER — Telehealth: Payer: Self-pay

## 2018-01-16 DIAGNOSIS — C541 Malignant neoplasm of endometrium: Secondary | ICD-10-CM | POA: Diagnosis not present

## 2018-01-16 DIAGNOSIS — M8088XD Other osteoporosis with current pathological fracture, vertebra(e), subsequent encounter for fracture with routine healing: Secondary | ICD-10-CM | POA: Diagnosis not present

## 2018-01-16 DIAGNOSIS — Z7951 Long term (current) use of inhaled steroids: Secondary | ICD-10-CM | POA: Diagnosis not present

## 2018-01-16 DIAGNOSIS — M5136 Other intervertebral disc degeneration, lumbar region: Secondary | ICD-10-CM | POA: Diagnosis not present

## 2018-01-16 DIAGNOSIS — Z87891 Personal history of nicotine dependence: Secondary | ICD-10-CM | POA: Diagnosis not present

## 2018-01-16 DIAGNOSIS — C50512 Malignant neoplasm of lower-outer quadrant of left female breast: Secondary | ICD-10-CM | POA: Diagnosis not present

## 2018-01-16 NOTE — Telephone Encounter (Signed)
Copied from [place] [phone]. Topic: Quick Communication - See Telephone Encounter >> Jan 05, 2018  8:50 AM Ether Griffins B wrote: CRM for notification. See Telephone encounter for: 01/05/18.  Pt requesting nitroglycerin patches for her fracture.  >> Jan 05, 2018 10:10 AM Jasper Loser, CMA wrote: Called and left VM for pt to call the office.  >> Jan 16, 2018  1:25 PM Oneta Rack wrote: Relation to pt: self Call back number: [phone]    Reason for call:  Patient requesting cortisone shot or PCP recoomendation due to experiencing pain at the top of her leg, please advise

## 2018-01-16 NOTE — Telephone Encounter (Signed)
Spoke with pt and she advised that she just finished up with PT and they gave her some exercises to do. She wants to try these over the weekend to see if she gets any relief. If sx persist she will call back to r/s 02/10/18 visit to a sooner appointment.

## 2018-01-20 ENCOUNTER — Telehealth: Payer: Self-pay | Admitting: Sports Medicine

## 2018-01-20 NOTE — Telephone Encounter (Signed)
Last OV 12/31/2017, please advise.

## 2018-01-20 NOTE — Telephone Encounter (Signed)
Copied from [place] [id number]. Topic: Quick Communication - See Telephone Encounter >> Jan 20, 2018  8:47 AM Cleaster Corin, NT wrote: CRM for notification. See Telephone encounter for: 01/20/18.  Pt. Calling about groin pain and having to depend on using the walker pt. Wants to know if she needs to make an appt. To be seen by Dr. Paulla Fore or can he just call her something in for inflamtion.  Isn't working pt. Is having [phone]

## 2018-01-21 ENCOUNTER — Other Ambulatory Visit: Payer: Self-pay

## 2018-01-21 DIAGNOSIS — C541 Malignant neoplasm of endometrium: Secondary | ICD-10-CM | POA: Diagnosis not present

## 2018-01-21 DIAGNOSIS — Z87891 Personal history of nicotine dependence: Secondary | ICD-10-CM | POA: Diagnosis not present

## 2018-01-21 DIAGNOSIS — Z7951 Long term (current) use of inhaled steroids: Secondary | ICD-10-CM | POA: Diagnosis not present

## 2018-01-21 DIAGNOSIS — C50512 Malignant neoplasm of lower-outer quadrant of left female breast: Secondary | ICD-10-CM | POA: Diagnosis not present

## 2018-01-21 DIAGNOSIS — M5136 Other intervertebral disc degeneration, lumbar region: Secondary | ICD-10-CM | POA: Diagnosis not present

## 2018-01-21 DIAGNOSIS — M8088XD Other osteoporosis with current pathological fracture, vertebra(e), subsequent encounter for fracture with routine healing: Secondary | ICD-10-CM | POA: Diagnosis not present

## 2018-01-21 MED ORDER — CELECOXIB 100 MG PO CAPS: 100.0000 mg | ORAL_CAPSULE | Freq: Two times a day (BID) | ORAL | 1 refills | Status: DC

## 2018-01-21 NOTE — Telephone Encounter (Signed)
See note

## 2018-01-21 NOTE — Telephone Encounter (Signed)
Pt is calling to check the status of request. She is hoping to hear something today. Please advise.

## 2018-01-21 NOTE — Telephone Encounter (Signed)
Per Dr. Paulla Fore, [place] to send in Celebrex 100 gm BID. Called pt and advised. She also advised that she has not been taking Diclofenac because she didn't think it was helping. Rx for Celebrex has been sent to [facility].

## 2018-01-27 NOTE — Telephone Encounter (Signed)
Pleas ensure she has discontinued the diclofenac.

## 2018-01-28 ENCOUNTER — Other Ambulatory Visit: Payer: Self-pay

## 2018-01-28 NOTE — Telephone Encounter (Signed)
Spoke with patient and she advised that she did not start Celebrex after talking to the pharmacist about side effects. She is not taking Diclofenac either because it made her sleepy. She feels like she is doing well despite not taking medications for inflammation. She "cannot even tell" she isn't taking them. She will keep scheduled f/u and call the office is she needs [place] before then.

## 2018-01-29 DIAGNOSIS — C541 Malignant neoplasm of endometrium: Secondary | ICD-10-CM | POA: Diagnosis not present

## 2018-01-29 DIAGNOSIS — M5136 Other intervertebral disc degeneration, lumbar region: Secondary | ICD-10-CM | POA: Diagnosis not present

## 2018-01-29 DIAGNOSIS — C50512 Malignant neoplasm of lower-outer quadrant of left female breast: Secondary | ICD-10-CM | POA: Diagnosis not present

## 2018-01-29 DIAGNOSIS — M8088XD Other osteoporosis with current pathological fracture, vertebra(e), subsequent encounter for fracture with routine healing: Secondary | ICD-10-CM | POA: Diagnosis not present

## 2018-01-29 DIAGNOSIS — Z87891 Personal history of nicotine dependence: Secondary | ICD-10-CM | POA: Diagnosis not present

## 2018-01-29 DIAGNOSIS — Z7951 Long term (current) use of inhaled steroids: Secondary | ICD-10-CM | POA: Diagnosis not present

## 2018-01-30 DIAGNOSIS — Z7951 Long term (current) use of inhaled steroids: Secondary | ICD-10-CM | POA: Diagnosis not present

## 2018-01-30 DIAGNOSIS — M8088XD Other osteoporosis with current pathological fracture, vertebra(e), subsequent encounter for fracture with routine healing: Secondary | ICD-10-CM | POA: Diagnosis not present

## 2018-01-30 DIAGNOSIS — C50512 Malignant neoplasm of lower-outer quadrant of left female breast: Secondary | ICD-10-CM | POA: Diagnosis not present

## 2018-01-30 DIAGNOSIS — Z87891 Personal history of nicotine dependence: Secondary | ICD-10-CM | POA: Diagnosis not present

## 2018-01-30 DIAGNOSIS — M5136 Other intervertebral disc degeneration, lumbar region: Secondary | ICD-10-CM | POA: Diagnosis not present

## 2018-01-30 DIAGNOSIS — C541 Malignant neoplasm of endometrium: Secondary | ICD-10-CM | POA: Diagnosis not present

## 2018-02-02 DIAGNOSIS — C541 Malignant neoplasm of endometrium: Secondary | ICD-10-CM | POA: Diagnosis not present

## 2018-02-02 DIAGNOSIS — T887XXA Unspecified adverse effect of drug or medicament, initial encounter: Secondary | ICD-10-CM | POA: Diagnosis not present

## 2018-02-04 DIAGNOSIS — C541 Malignant neoplasm of endometrium: Secondary | ICD-10-CM | POA: Diagnosis not present

## 2018-02-04 DIAGNOSIS — M8088XD Other osteoporosis with current pathological fracture, vertebra(e), subsequent encounter for fracture with routine healing: Secondary | ICD-10-CM | POA: Diagnosis not present

## 2018-02-04 DIAGNOSIS — C50512 Malignant neoplasm of lower-outer quadrant of left female breast: Secondary | ICD-10-CM | POA: Diagnosis not present

## 2018-02-04 DIAGNOSIS — Z7951 Long term (current) use of inhaled steroids: Secondary | ICD-10-CM | POA: Diagnosis not present

## 2018-02-04 DIAGNOSIS — M5136 Other intervertebral disc degeneration, lumbar region: Secondary | ICD-10-CM | POA: Diagnosis not present

## 2018-02-04 DIAGNOSIS — Z87891 Personal history of nicotine dependence: Secondary | ICD-10-CM | POA: Diagnosis not present

## 2018-02-05 DIAGNOSIS — C541 Malignant neoplasm of endometrium: Secondary | ICD-10-CM | POA: Diagnosis not present

## 2018-02-05 DIAGNOSIS — Z5112 Encounter for antineoplastic immunotherapy: Secondary | ICD-10-CM | POA: Diagnosis not present

## 2018-02-09 DIAGNOSIS — H52203 Unspecified astigmatism, bilateral: Secondary | ICD-10-CM | POA: Diagnosis not present

## 2018-02-09 DIAGNOSIS — H04123 Dry eye syndrome of bilateral lacrimal glands: Secondary | ICD-10-CM | POA: Diagnosis not present

## 2018-02-09 DIAGNOSIS — H47323 Drusen of optic disc, bilateral: Secondary | ICD-10-CM | POA: Diagnosis not present

## 2018-02-09 DIAGNOSIS — H26493 Other secondary cataract, bilateral: Secondary | ICD-10-CM | POA: Diagnosis not present

## 2018-02-10 ENCOUNTER — Encounter: Payer: Self-pay | Admitting: Sports Medicine

## 2018-02-10 ENCOUNTER — Ambulatory Visit: Payer: Medicare HMO | Admitting: Sports Medicine

## 2018-02-10 ENCOUNTER — Ambulatory Visit: Payer: Self-pay

## 2018-02-10 VITALS — BP 104/76 | HR 93 | Ht 66.5 in | Wt 141.2 lb

## 2018-02-10 DIAGNOSIS — M25551 Pain in right hip: Secondary | ICD-10-CM | POA: Diagnosis not present

## 2018-02-10 NOTE — Procedures (Signed)
PROCEDURE NOTE:  Ultrasound Guided: Injection: Right hip, intra-articular Images were obtained and interpreted by myself, Teresa Coombs, DO  Images have been saved and stored to PACS system. Images obtained on: GE S7 Ultrasound machine   DESCRIPTION OF PROCEDURE:  The patient's clinical condition is marked by substantial pain and/or significant functional disability. Other conservative therapy has not provided relief, is contraindicated, or not appropriate. There is a reasonable likelihood that injection will significantly improve the patient's pain and/or functional impairment.   After discussing the risks, benefits and expected outcomes of the injection and all questions were reviewed and answered, the patient wished to undergo the above named procedure.  Verbal consent was obtained.  The ultrasound was used to identify the target structure and adjacent neurovascular structures. The skin was then prepped in sterile fashion and the target structure was injected under direct visualization using sterile technique as below:  PREP: Alcohol and Ethel Chloride APPROACH: direct, stopcock technique, 22g 3.5 in. INJECTATE: 5 cc 1% lidocaine, 2 cc 0.5% Marcaine and 2 cc 40mg /mL DepoMedrol ASPIRATE: None DRESSING: Band-Aid  Post procedural instructions including recommending icing and warning signs for infection were reviewed.    This procedure was well tolerated and there were no complications.   IMPRESSION: Succesful Ultrasound Guided: Injection

## 2018-02-10 NOTE — Progress Notes (Signed)
[name]. [place], [place] at [place]  Autumn Frazier - 77 y.o. female MRN [id number]  Date of birth: 1941-03-18  Visit Date: 02/10/2018  PCP: Josetta Huddle, MD   Referred by: Josetta Huddle, MD  Scribe for today's visit: Josepha Pigg, CMA     SUBJECTIVE:  Autumn Frazier "[name]" is here for Follow-up (sacral fracture)  11/17/17: Her L hip pain, sacral pain symptoms INITIALLY: Began about a week ago w/ no known MOI.  States that she fell about 5-6 days prior to her L hip hurting and isn't sure if that's related. Described as 7/10 intense pain at it's worst but has no pain at rest , nonradiating Worsened with weight bearing Improved with rest and non-weight bearing activities Additional associated symptoms include: no popping or clicking and no N/T into the L LE At this time symptoms show no change compared to onset. She has taken both Aleve and Tylenol and IBU.  She has also been using some medicated hot patches.  12/10/17: Compared to the last office visit, her previously described symptoms are improving. She has a lot of pain after lying for prolonged periods of time.  Current symptoms are mild & are radiating to the left thigh and at times into the L calf.  She was prescribed Tramadol, Hydrocodone, and Soma but she hasn't been taking them. She has been taking tylenol extra strength and diclofenac with some relief. She has been icing the area TID with temporarily relief. She has been ambulating with either a walker or a cane, she prefers the cane.   12/31/2017: Compared to the last office visit, her previously described symptoms are improving. She has some pain in the R groin, thinks she pulled a muscle while doing PT.  Current symptoms are mild & are radiating to L thigh and calf but this has been less severe and only occurs first thing in the morning.  She has been taking tylenol ES and Diclofenac. She has been doing PT 2 x  weekly, hoping to increase to 3 x weekly.   02/10/2018: Compared to the last office visit, her previously described symptoms are improving. She continues to have pain in the R groin, not as severe. She has a pulling sensation across her abd when first getting up in the mornings.  Current symptoms are moderate & are non-radiating She has been taking Tylenol ES, she stopped taking Diclofenac. She was prescribed Celebrex but didn't start taking it d/t possible side effects. She has been ambulating more with her cane. She will still use the walker if going longer distances. She is still doing PT Had MRI l-spine and pelvis 11/29/2017.   ROS Denies night time disturbances. Denies fevers, chills, or night sweats. Denies unexplained weight loss. Reports personal history of cancer. Denies changes in bowel or bladder habits. Denies recent unreported falls. Denies new or worsening dyspnea or wheezing. Denies headaches or dizziness.  Denies numbness, tingling or weakness  In the extremities.  Denies dizziness or presyncopal episodes Denies lower extremity edema    HISTORY & PERTINENT PRIOR DATA:  Prior History reviewed and updated per electronic medical record.  Significant/pertinent history, findings, studies include:  reports that she quit smoking about 35 years ago. Her smoking use included cigarettes. She has never used smokeless tobacco. No results for input(s): HGBA1C, LABURIC, CREATINE in the last 8760 hours. No specialty comments available. No problems updated.  OBJECTIVE:  VS:  HT:5' 6.5" (168.9 cm)  WT:141 lb 3.2 oz (64 kg)  BMI:22.45    BP:104/76  HR:93bpm  TEMP: ( )  RESP:96 %   PHYSICAL EXAM: Constitutional: WDWN, Non-toxic appearing. Psychiatric: Alert & appropriately interactive.  Not depressed or anxious appearing. Respiratory: No increased work of breathing.  Trachea Midline Eyes: Pupils are equal.  EOM intact without nystagmus.  No scleral icterus  Vascular  Exam: warm to touch no edema  lower extremity neuro exam: unremarkable normal strength normal sensation  MSK Exam: No focal bony pain.  She does have pain with Stinchfield testing which is slightly more than in the past.  She has an antalgic gait and wide-based gait.  Negative straight leg raises   ASSESSMENT & PLAN:   1. Right hip pain     PLAN: Continues to have ongoing pain with symptoms that seem to be localizing more to the right groin and may be reflective of underlying OA more so than anything at this point given this is the one aspect that has not improved following her sacral fracture.  We will plan to go ahead and perform an intra-articular injection today to see for both therapeutic and diagnostic purposes.  Follow-up: Return in about 6 weeks (around 03/24/2018).      Please see additional documentation for Objective, Assessment and Plan sections. Pertinent additional documentation may be included in corresponding procedure notes, imaging studies, problem based documentation and patient instructions. Please see these sections of the encounter for additional information regarding this visit.  CMA/ATC served as Education administrator during this visit. History, Physical, and Plan performed by medical provider. Documentation and orders reviewed and attested to.      Gerda Diss, [facility] Physician

## 2018-02-10 NOTE — Patient Instructions (Signed)

## 2018-02-12 ENCOUNTER — Ambulatory Visit: Payer: Medicare HMO | Admitting: Sports Medicine

## 2018-02-12 DIAGNOSIS — Z87891 Personal history of nicotine dependence: Secondary | ICD-10-CM | POA: Diagnosis not present

## 2018-02-12 DIAGNOSIS — M8088XD Other osteoporosis with current pathological fracture, vertebra(e), subsequent encounter for fracture with routine healing: Secondary | ICD-10-CM | POA: Diagnosis not present

## 2018-02-12 DIAGNOSIS — Z7951 Long term (current) use of inhaled steroids: Secondary | ICD-10-CM | POA: Diagnosis not present

## 2018-02-12 DIAGNOSIS — C541 Malignant neoplasm of endometrium: Secondary | ICD-10-CM | POA: Diagnosis not present

## 2018-02-12 DIAGNOSIS — C50512 Malignant neoplasm of lower-outer quadrant of left female breast: Secondary | ICD-10-CM | POA: Diagnosis not present

## 2018-02-12 DIAGNOSIS — M5136 Other intervertebral disc degeneration, lumbar region: Secondary | ICD-10-CM | POA: Diagnosis not present

## 2018-02-22 ENCOUNTER — Encounter: Payer: Self-pay | Admitting: Sports Medicine

## 2018-02-26 DIAGNOSIS — Z5111 Encounter for antineoplastic chemotherapy: Secondary | ICD-10-CM | POA: Diagnosis not present

## 2018-02-26 DIAGNOSIS — Z9079 Acquired absence of other genital organ(s): Secondary | ICD-10-CM | POA: Diagnosis not present

## 2018-02-26 DIAGNOSIS — Z923 Personal history of irradiation: Secondary | ICD-10-CM | POA: Diagnosis not present

## 2018-02-26 DIAGNOSIS — N289 Disorder of kidney and ureter, unspecified: Secondary | ICD-10-CM | POA: Diagnosis not present

## 2018-02-26 DIAGNOSIS — C541 Malignant neoplasm of endometrium: Secondary | ICD-10-CM | POA: Diagnosis not present

## 2018-02-26 DIAGNOSIS — F329 Major depressive disorder, single episode, unspecified: Secondary | ICD-10-CM | POA: Diagnosis not present

## 2018-02-26 DIAGNOSIS — Z90722 Acquired absence of ovaries, bilateral: Secondary | ICD-10-CM | POA: Diagnosis not present

## 2018-02-26 DIAGNOSIS — G629 Polyneuropathy, unspecified: Secondary | ICD-10-CM | POA: Diagnosis not present

## 2018-02-26 DIAGNOSIS — Z8041 Family history of malignant neoplasm of ovary: Secondary | ICD-10-CM | POA: Diagnosis not present

## 2018-02-26 DIAGNOSIS — Z90710 Acquired absence of both cervix and uterus: Secondary | ICD-10-CM | POA: Diagnosis not present

## 2018-03-05 ENCOUNTER — Encounter: Payer: Self-pay | Admitting: *Deleted

## 2018-03-05 NOTE — Progress Notes (Signed)
On 03-05-18 fax medical records to [facility]

## 2018-03-10 DIAGNOSIS — C50912 Malignant neoplasm of unspecified site of left female breast: Secondary | ICD-10-CM | POA: Diagnosis not present

## 2018-03-12 ENCOUNTER — Ambulatory Visit: Payer: Medicare HMO | Admitting: Radiation Oncology

## 2018-03-18 DIAGNOSIS — C541 Malignant neoplasm of endometrium: Secondary | ICD-10-CM | POA: Diagnosis not present

## 2018-03-18 DIAGNOSIS — Z5111 Encounter for antineoplastic chemotherapy: Secondary | ICD-10-CM | POA: Diagnosis not present

## 2018-03-19 ENCOUNTER — Ambulatory Visit
Admission: RE | Admit: 2018-03-19 | Discharge: 2018-03-19 | Disposition: A | Discharge status: Home / Self Care | Payer: Medicare HMO | Source: Ambulatory Visit | Attending: Radiation Oncology | Admitting: Radiation Oncology

## 2018-03-19 ENCOUNTER — Ambulatory Visit: Payer: Self-pay | Admitting: Radiation Oncology

## 2018-03-19 ENCOUNTER — Encounter: Payer: Self-pay | Admitting: Radiation Oncology

## 2018-03-19 ENCOUNTER — Other Ambulatory Visit: Payer: Self-pay

## 2018-03-19 VITALS — BP 132/90 | HR 84 | Temp 98.6°F | Resp 20 | Wt 143.4 lb

## 2018-03-19 DIAGNOSIS — Z90710 Acquired absence of both cervix and uterus: Secondary | ICD-10-CM | POA: Insufficient documentation

## 2018-03-19 DIAGNOSIS — Z79811 Long term (current) use of aromatase inhibitors: Secondary | ICD-10-CM | POA: Diagnosis not present

## 2018-03-19 DIAGNOSIS — C541 Malignant neoplasm of endometrium: Secondary | ICD-10-CM

## 2018-03-19 DIAGNOSIS — Z08 Encounter for follow-up examination after completed treatment for malignant neoplasm: Secondary | ICD-10-CM | POA: Diagnosis not present

## 2018-03-19 DIAGNOSIS — Z79899 Other long term (current) drug therapy: Secondary | ICD-10-CM | POA: Diagnosis not present

## 2018-03-19 DIAGNOSIS — C55 Malignant neoplasm of uterus, part unspecified: Secondary | ICD-10-CM | POA: Insufficient documentation

## 2018-03-19 NOTE — Progress Notes (Signed)
Radiation Oncology         ([phone]) [phone] ________________________________  Name: Autumn Frazier MRN: [id number]  Date: 03/19/2018  DOB: [birth year]  Follow-Up Visit Note  CC: Josetta Huddle, MD  Hart Rochester, MD    ICD-10-CM   1. Endometrial cancer ([place]) C54.1     Diagnosis:   77 y.o. 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 07/14/2017 - 08/19/2017 2. 08/27/2017, 09/04/2017, 09/11/2017  Site/dose:    1. The pelvis was treated to 45 Gy in 25 fractions of 1.8 Gy. 2. Brachytherapy Boost: The vaginal cuff was treated 18 Gy in 3 fractions of 6 Gy.  Beams/energy:    1. IMRT // 6XFFF Photon 2. HDR Ir-192 Vaginal Brachytherapy  Narrative:  The patient returns today for routine follow-up. She is doing well overall.   She is getting herceptin q 3 weeks without any heart issues.   she goes to Dr. Polly Cobia for treatment. She is doing well with herceptin treatment and denies any side effects following chemotherapy or radiation treatments. She saw Dr. Polly Cobia recently and will have another pelvic exam next month. She is following up with Dr. Polly Cobia q 3 weeks for herceptin and she is obtaining a pelvic every other month. She has been using her vaginal dilator.   She notes that she slipped and fell on her floor recently which caused a sacral fracture that she was evaluated for and she is healing from at this time. She is walking with a cane at this time. She notes that in the morning from her bilateral hips down she feels like "cement" which she is contributing to her recent fracture. She is able to drive herself.   Since her last visit to the office, she underwent CT pelvis wo contrast on 11/25/2017 due to a fall with results showing: Bilateral sacral fractures. No definitive hip fracture is seen. Degenerative changes of the hip joints.  She also had a MRI Pelvis  completed on 11/29/2017 with results of: Acute appearing bilateral sacral ala and S1-S2 segment insufficiency fractures. No suspicious osseous lesion. Prior hysterectomy. No pelvic soft tissue mass or lymphadenopathy.   On review of systems, she denies decreased appetite, diarrhea, and any other symptoms. Pertinent positives are listed and detailed within the above HPI.                            ALLERGIES:  has No Known Allergies.  Meds: Current Outpatient Medications  Medication Sig Dispense Refill  . acetaminophen (TYLENOL) 500 MG tablet Take 500 mg by mouth every 6 (six) hours as needed.    Marland Kitchen anastrozole (ARIMIDEX) 1 MG tablet TAKE 1 TABLET(1 MG) BY MOUTH DAILY 90 tablet 0  . b complex vitamins tablet Take 1 tablet by mouth daily.    . Biotin 5000 MCG TABS Take 5,000 mcg by mouth daily.     . calcium carbonate (OS-CAL) 600 MG TABS tablet Take by mouth.    . carisoprodol (SOMA) 350 MG tablet Take 1 tablet (350 mg total) by mouth at bedtime as needed for muscle spasms. 10 tablet 0  . cholecalciferol (VITAMIN D) 1000 units tablet Take 1,000 Units by mouth daily.    . diclofenac (VOLTAREN) 75 MG EC tablet Take by mouth.    . escitalopram (LEXAPRO) 20 MG  tablet Take 20 mg by mouth daily.   11  . Fluticasone-Salmeterol (ADVAIR) 100-50 MCG/DOSE AEPB Inhale 1 puff into the lungs every 12 (twelve) hours. 60 each 1  . gabapentin (NEURONTIN) 300 MG capsule take 300mg  by mouth at bedtime  2  . levothyroxine (SYNTHROID, LEVOTHROID) 88 MCG tablet TK 1 T PO QAM OES  11  . LORazepam (ATIVAN) 0.5 MG tablet Take 1 tablet (0.5 mg total) by mouth every 8 (eight) hours. As needed for anxiety or nausea 30 tablet 0   No current facility-administered medications for this encounter.    Facility-Administered Medications Ordered in Other Encounters  Medication Dose Route Frequency Provider Last Rate Last Dose  . sodium chloride flush (NS) 0.9 % injection 10 mL  10 mL Intravenous PRN Gery Pray, MD   10 mL at  09/09/17 1351    Physical Findings: The patient is in no acute distress. Patient is alert and oriented.  weight is 143 lb 6.4 oz (65 kg). Her oral temperature is 98.6 F (37 C). Her blood pressure is 132/90 and her pulse is 84. Her respiration is 20 and oxygen saturation is 97%. .  Lungs are clear to auscultation bilaterally. Heart has regular rate and rhythm. No palpable cervical, supraclavicular, or axillary adenopathy. Abdomen soft, non-tender, normal bowel sounds. Pelvic exam deferred today in light of pelvic exam being performed by Dr. Polly Cobia every other month.  Lab Findings: Lab Results  Component Value Date   WBC 3.6 (L) 09/09/2017   HGB 11.5 (L) 09/09/2017   HCT 36.2 09/09/2017   MCV 102.5 (H) 09/09/2017   PLT 115 (L) 09/09/2017    Radiographic Findings: No results found.  Impression:  Clinically stable. Patient has recovered from her radiation therapy with no problems with diarrhea or other issues. She is tolerating herceptin well.    Plan:  Routine follow-up in radiation oncology in 6 months. Patient will see Dr. Polly Cobia next month for a pelvic exam.   ____________________________________ -----------------------------------  Blair Promise, PhD, MD  This document serves as a record of services personally performed by Gery Pray, MD. It was created on his behalf by [facility], a trained medical scribe. The creation of this record is based on the scribe's personal observations and the provider's statements to them. This document has been checked and approved by the attending provider.

## 2018-03-19 NOTE — Progress Notes (Signed)
Pt here today for a follow-up of uterine cancer. Pt denies having any pain or fatigue. Pt denies having any diarrhea. Pt denies having any nausea or vomiting. Pt denies having any vaginal or rectal bleeding or vaginal discharge. Pt denies having any dysuria. Pt denies having any skin irritation. Pt fell and broke her sacrum so she has not been using the vaginal dilator until recently. Pt states that she is using the small size dilator 3 times per week for about 15 minutes.   BP 132/90 (BP Location: Right Arm, Patient Position: Sitting, Cuff Size: Normal)   Pulse 84   Temp 98.6 F (37 C) (Oral)   Resp 20   Wt 143 lb 6.4 oz (65 kg)   SpO2 97%   BMI 22.80 kg/m   Wt Readings from Last 3 Encounters:  03/19/18 143 lb 6.4 oz (65 kg)  02/10/18 141 lb 3.2 oz (64 kg)  12/31/17 150 lb 6.4 oz (68.2 kg)

## 2018-03-24 DIAGNOSIS — C50912 Malignant neoplasm of unspecified site of left female breast: Secondary | ICD-10-CM | POA: Diagnosis not present

## 2018-04-02 ENCOUNTER — Ambulatory Visit: Payer: Medicare HMO | Admitting: Sports Medicine

## 2018-04-06 ENCOUNTER — Encounter: Payer: Self-pay | Admitting: Sports Medicine

## 2018-04-06 ENCOUNTER — Ambulatory Visit (INDEPENDENT_AMBULATORY_CARE_PROVIDER_SITE_OTHER): Payer: Medicare HMO | Admitting: Sports Medicine

## 2018-04-06 VITALS — BP 120/78 | HR 82 | Ht 66.5 in | Wt 146.0 lb

## 2018-04-06 DIAGNOSIS — M5136 Other intervertebral disc degeneration, lumbar region: Secondary | ICD-10-CM

## 2018-04-06 DIAGNOSIS — R269 Unspecified abnormalities of gait and mobility: Secondary | ICD-10-CM

## 2018-04-06 DIAGNOSIS — M25551 Pain in right hip: Secondary | ICD-10-CM | POA: Diagnosis not present

## 2018-04-06 DIAGNOSIS — S32110A Nondisplaced Zone I fracture of sacrum, initial encounter for closed fracture: Secondary | ICD-10-CM

## 2018-04-06 DIAGNOSIS — C541 Malignant neoplasm of endometrium: Secondary | ICD-10-CM

## 2018-04-06 DIAGNOSIS — M8008XD Age-related osteoporosis with current pathological fracture, vertebra(e), subsequent encounter for fracture with routine healing: Secondary | ICD-10-CM | POA: Diagnosis not present

## 2018-04-06 NOTE — Progress Notes (Signed)
Autumn Frazier. [place], [place] [facility] at [facility]  Autumn Frazier - 77 y.o. female MRN [id number]  Date of birth: May 19, 1941  Visit Date: 04/06/2018  PCP: Josetta Huddle, MD   Referred by: Josetta Huddle, MD  Scribe(s) for today's visit: Josepha Pigg, CMA  SUBJECTIVE:  Autumn Frazier "[name]" is here for Follow-up (R hip pain)   11/17/17: Her L hip pain, sacral pain symptoms INITIALLY: Began about a week ago w/ no known MOI.  States that she fell about 5-6 days prior to her L hip hurting and isn't sure if that's related. Described as 7/10 intense pain at it's worst but has no pain at rest , nonradiating Worsened with weight bearing Improved with rest and non-weight bearing activities Additional associated symptoms include: no popping or clicking and no N/T into the L LE At this time symptoms show no change compared to onset. She has taken both Aleve and Tylenol and IBU.  She has also been using some medicated hot patches.  12/10/17: Compared to the last office visit, her previously described symptoms are improving. She has a lot of pain after lying for prolonged periods of time.  Current symptoms are mild & are radiating to the left thigh and at times into the L calf.  She was prescribed Tramadol, Hydrocodone, and Soma but she hasn't been taking them. She has been taking tylenol extra strength and diclofenac with some relief. She has been icing the area TID with temporarily relief. She has been ambulating with either a walker or a cane, she prefers the cane.   12/31/2017: Compared to the last office visit, her previously described symptoms are improving. She has some pain in the R groin, thinks she pulled a muscle while doing PT.  Current symptoms are mild & are radiating to L thigh and calf but this has been less severe and only occurs first thing in the morning.  She has been taking tylenol ES and Diclofenac. She has been doing PT 2 x weekly,  hoping to increase to 3 x weekly.   02/10/2018: Compared to the last office visit, her previously described symptoms are improving. She continues to have pain in the R groin, not as severe. She has a pulling sensation across her abd when first getting up in the mornings.  Current symptoms are moderate & are non-radiating She has been taking Tylenol ES, she stopped taking Diclofenac. She was prescribed Celebrex but didn't start taking it d/t possible side effects. She has been ambulating more with her cane. She will still use the walker if going longer distances. She is still doing PT Had MRI l-spine and pelvis 11/29/2017.   04/06/2018: Compared to the last office visit, her previously described symptoms are improving. She continues to have tightness across her pelvis when first getting up in the morning but it resolves throughout the day. She says that it feels almost like cement.  Current symptoms are mild & are non-radiating. She denies pain with walking or when sleeping.  She has been taking Tylenol ES, Diclofenac with some relief. She does not take Celebrex. She does ambulate with a cane when out of the house.  She received steroid injection 02/10/18 and got relief with this. She tolerated injection well with so side effects.    REVIEW OF SYSTEMS: Denies night time disturbances. Denies fevers, chills, or night sweats. Denies unexplained weight loss. Reports personal history of cancer. Denies changes in bowel or bladder habits.  Denies recent unreported falls. Denies new or worsening dyspnea or wheezing. Denies headaches or dizziness.  Denies numbness, tingling or weakness  In the extremities.  Denies dizziness or presyncopal episodes Denies lower extremity edema    HISTORY & PERTINENT PRIOR DATA:  Prior History reviewed and updated per electronic medical record.  Significant/pertinent history, findings, studies include:  reports that she quit smoking about 35 years ago. Her smoking use  included cigarettes. She has never used smokeless tobacco. No results for input(s): HGBA1C, LABURIC, CREATINE in the last 8760 hours. No specialty comments available. No problems updated.  OBJECTIVE:  VS:  HT:5' 6.5" (168.9 cm)   WT:146 lb (66.2 kg)  BMI:23.21    BP:120/78  HR:82bpm  TEMP: ( )  RESP:95 %   PHYSICAL EXAM: Constitutional: WDWN, Non-toxic appearing. Psychiatric: Alert & appropriately interactive.  Not depressed or anxious appearing. Respiratory: No increased work of breathing.  Trachea Midline Eyes: Pupils are equal.  EOM intact without nystagmus.  No scleral icterus  Vascular Exam: warm to touch no edema  lower extremity neuro exam: unremarkable  MSK Exam: Bilateral lower extremities overall well aligned without significant swelling or deformity.  She has good internal and external rotation of bilateral hips with logroll and negative Stinchfield test.  She has improved pain over the greater trochanters.  No focal bony tenderness.  She walks with a wide-based gait.    ASSESSMENT & PLAN:   1. Right hip pain   2. Endometrial cancer ([place])   3. Other intervertebral disc degeneration, lumbar region   4. Closed nondisplaced zone I fracture of sacrum, initial encounter ([place])   5. Gait disturbance   6. Pathological fracture of vertebra due to age-related osteoporosis with routine healing, subsequent encounter     PLAN: He is overall significantly better than the last office visit.  Injection with some additional relief.  She continues to have some tightness across her lower pelvis when first awakening with this improves and may be related to the underlying radiation therapy that she had previously.  She is not taking bisphosphonate therapy at this time and will discuss this further with her primary care physician but given the radiation likely being directly related to the compression fracture she had recommended she continue with weightbearing and and appropriate  nutrition.  She has had good improvements in her gait and physical ability and actually has a sit/stand test of less than 3 seconds.  She is continuing to use a cane at this time I recommend she continue with this while out of the home.   She would like to follow-up with me one additional time in 3 months to ensure clinical resolution but overall she is doing quite well for being 5 months out from this injury.  Follow-up: Return in about 3 months (around 06/29/2018).      Please see additional documentation for Objective, Assessment and Plan sections. Pertinent additional documentation may be included in corresponding procedure notes, imaging studies, problem based documentation and patient instructions. Please see these sections of the encounter for additional information regarding this visit.  CMA/ATC served as Education administrator during this visit. History, Physical, and Plan performed by medical provider. Documentation and orders reviewed and attested to.      Gerda Diss, [facility] Physician

## 2018-04-08 DIAGNOSIS — C55 Malignant neoplasm of uterus, part unspecified: Secondary | ICD-10-CM | POA: Diagnosis not present

## 2018-04-08 DIAGNOSIS — C775 Secondary and unspecified malignant neoplasm of intrapelvic lymph nodes: Secondary | ICD-10-CM | POA: Diagnosis not present

## 2018-04-08 DIAGNOSIS — C541 Malignant neoplasm of endometrium: Secondary | ICD-10-CM | POA: Diagnosis not present

## 2018-04-08 DIAGNOSIS — N289 Disorder of kidney and ureter, unspecified: Secondary | ICD-10-CM | POA: Diagnosis not present

## 2018-04-08 DIAGNOSIS — Z5181 Encounter for therapeutic drug level monitoring: Secondary | ICD-10-CM | POA: Diagnosis not present

## 2018-04-08 DIAGNOSIS — Z79811 Long term (current) use of aromatase inhibitors: Secondary | ICD-10-CM | POA: Diagnosis not present

## 2018-04-08 DIAGNOSIS — G629 Polyneuropathy, unspecified: Secondary | ICD-10-CM | POA: Diagnosis not present

## 2018-04-08 DIAGNOSIS — Z5111 Encounter for antineoplastic chemotherapy: Secondary | ICD-10-CM | POA: Diagnosis not present

## 2018-04-08 DIAGNOSIS — Z90710 Acquired absence of both cervix and uterus: Secondary | ICD-10-CM | POA: Diagnosis not present

## 2018-04-08 DIAGNOSIS — F329 Major depressive disorder, single episode, unspecified: Secondary | ICD-10-CM | POA: Diagnosis not present

## 2018-04-08 DIAGNOSIS — C50912 Malignant neoplasm of unspecified site of left female breast: Secondary | ICD-10-CM | POA: Diagnosis not present

## 2018-04-29 DIAGNOSIS — C541 Malignant neoplasm of endometrium: Secondary | ICD-10-CM | POA: Diagnosis not present

## 2018-04-29 DIAGNOSIS — I427 Cardiomyopathy due to drug and external agent: Secondary | ICD-10-CM | POA: Diagnosis not present

## 2018-04-29 DIAGNOSIS — T451X5A Adverse effect of antineoplastic and immunosuppressive drugs, initial encounter: Secondary | ICD-10-CM | POA: Diagnosis not present

## 2018-04-29 DIAGNOSIS — G629 Polyneuropathy, unspecified: Secondary | ICD-10-CM | POA: Diagnosis not present

## 2018-04-29 DIAGNOSIS — Z5111 Encounter for antineoplastic chemotherapy: Secondary | ICD-10-CM | POA: Diagnosis not present

## 2018-05-14 DIAGNOSIS — Z452 Encounter for adjustment and management of vascular access device: Secondary | ICD-10-CM | POA: Diagnosis not present

## 2018-05-14 DIAGNOSIS — Z08 Encounter for follow-up examination after completed treatment for malignant neoplasm: Secondary | ICD-10-CM | POA: Diagnosis not present

## 2018-05-14 DIAGNOSIS — C541 Malignant neoplasm of endometrium: Secondary | ICD-10-CM | POA: Diagnosis not present

## 2018-05-21 DIAGNOSIS — C541 Malignant neoplasm of endometrium: Secondary | ICD-10-CM | POA: Diagnosis not present

## 2018-05-21 DIAGNOSIS — Z9079 Acquired absence of other genital organ(s): Secondary | ICD-10-CM | POA: Diagnosis not present

## 2018-05-21 DIAGNOSIS — Z90710 Acquired absence of both cervix and uterus: Secondary | ICD-10-CM | POA: Diagnosis not present

## 2018-05-21 DIAGNOSIS — G629 Polyneuropathy, unspecified: Secondary | ICD-10-CM | POA: Diagnosis not present

## 2018-05-21 DIAGNOSIS — N289 Disorder of kidney and ureter, unspecified: Secondary | ICD-10-CM | POA: Diagnosis not present

## 2018-05-21 DIAGNOSIS — T451X5A Adverse effect of antineoplastic and immunosuppressive drugs, initial encounter: Secondary | ICD-10-CM | POA: Diagnosis not present

## 2018-05-21 DIAGNOSIS — Z90722 Acquired absence of ovaries, bilateral: Secondary | ICD-10-CM | POA: Diagnosis not present

## 2018-05-21 DIAGNOSIS — F329 Major depressive disorder, single episode, unspecified: Secondary | ICD-10-CM | POA: Diagnosis not present

## 2018-05-21 DIAGNOSIS — I427 Cardiomyopathy due to drug and external agent: Secondary | ICD-10-CM | POA: Diagnosis not present

## 2018-05-21 DIAGNOSIS — Z5111 Encounter for antineoplastic chemotherapy: Secondary | ICD-10-CM | POA: Diagnosis not present

## 2018-05-28 ENCOUNTER — Other Ambulatory Visit: Payer: Self-pay | Admitting: Oncology

## 2018-06-11 DIAGNOSIS — Z5111 Encounter for antineoplastic chemotherapy: Secondary | ICD-10-CM | POA: Diagnosis not present

## 2018-06-11 DIAGNOSIS — C541 Malignant neoplasm of endometrium: Secondary | ICD-10-CM | POA: Diagnosis not present

## 2018-06-16 ENCOUNTER — Telehealth: Payer: Self-pay | Admitting: Oncology

## 2018-06-16 NOTE — Telephone Encounter (Signed)
Pt Lvm to cancel appts  Will call to r/s later.

## 2018-06-29 ENCOUNTER — Ambulatory Visit: Payer: Medicare HMO | Admitting: Oncology

## 2018-06-29 ENCOUNTER — Other Ambulatory Visit: Payer: Medicare HMO

## 2018-07-02 DIAGNOSIS — Z5111 Encounter for antineoplastic chemotherapy: Secondary | ICD-10-CM | POA: Diagnosis not present

## 2018-07-02 DIAGNOSIS — C541 Malignant neoplasm of endometrium: Secondary | ICD-10-CM | POA: Diagnosis not present

## 2018-07-07 ENCOUNTER — Telehealth: Payer: Self-pay | Admitting: *Deleted

## 2018-07-07 NOTE — Telephone Encounter (Signed)
On 07-07-18 call pt to r/s appt to 09-28-2018 at 10:45. Cancel appt for 09-17-18 Dr.Kinard on vacation

## 2018-07-13 ENCOUNTER — Ambulatory Visit: Payer: Medicare HMO | Admitting: Sports Medicine

## 2018-07-23 DIAGNOSIS — C541 Malignant neoplasm of endometrium: Secondary | ICD-10-CM | POA: Diagnosis not present

## 2018-07-23 DIAGNOSIS — Z5111 Encounter for antineoplastic chemotherapy: Secondary | ICD-10-CM | POA: Diagnosis not present

## 2018-08-11 ENCOUNTER — Other Ambulatory Visit: Payer: Self-pay | Admitting: Oncology

## 2018-08-12 DIAGNOSIS — Z5111 Encounter for antineoplastic chemotherapy: Secondary | ICD-10-CM | POA: Diagnosis not present

## 2018-08-12 DIAGNOSIS — C541 Malignant neoplasm of endometrium: Secondary | ICD-10-CM | POA: Diagnosis not present

## 2018-08-27 DIAGNOSIS — C541 Malignant neoplasm of endometrium: Secondary | ICD-10-CM | POA: Diagnosis not present

## 2018-09-09 DIAGNOSIS — Z5112 Encounter for antineoplastic immunotherapy: Secondary | ICD-10-CM | POA: Diagnosis not present

## 2018-09-09 DIAGNOSIS — C541 Malignant neoplasm of endometrium: Secondary | ICD-10-CM | POA: Diagnosis not present

## 2018-09-17 ENCOUNTER — Ambulatory Visit: Payer: Self-pay | Admitting: Radiation Oncology

## 2018-09-28 ENCOUNTER — Ambulatory Visit: Payer: Medicare HMO | Admitting: Radiation Oncology

## 2018-09-29 ENCOUNTER — Telehealth: Payer: Self-pay | Admitting: Sports Medicine

## 2018-09-29 NOTE — Telephone Encounter (Addendum)
Forms received today and are being reviewed.   Last OV 04/06/18, f/u in 3 months.   No future appt scheduled.

## 2018-09-29 NOTE — Telephone Encounter (Signed)
Called pt and left message to have pt call the office back. Pt needs OV for re-evaluation.

## 2018-09-29 NOTE — Telephone Encounter (Signed)
See note  Copied from [place] [id number]. Topic: General - Other >> Sep 29, 2018  8:54 AM Valla Leaver wrote: Reason for CRM: [employer] wants to know if fax for medical records request was received on 09/28/2017?

## 2018-09-29 NOTE — Telephone Encounter (Signed)
Pt returned call. She would like a call back around mid-day tomorrow as her husband recently passed and she has several people at her house right now.

## 2018-10-01 NOTE — Telephone Encounter (Signed)
Spoke with patient, she advised that she no longer requires extended home health services and it has been nice working with [place].

## 2018-10-06 DIAGNOSIS — H1032 Unspecified acute conjunctivitis, left eye: Secondary | ICD-10-CM | POA: Diagnosis not present

## 2018-10-08 DIAGNOSIS — F329 Major depressive disorder, single episode, unspecified: Secondary | ICD-10-CM | POA: Diagnosis not present

## 2018-10-08 DIAGNOSIS — Z5111 Encounter for antineoplastic chemotherapy: Secondary | ICD-10-CM | POA: Diagnosis not present

## 2018-10-08 DIAGNOSIS — Z8542 Personal history of malignant neoplasm of other parts of uterus: Secondary | ICD-10-CM | POA: Diagnosis not present

## 2018-10-08 DIAGNOSIS — I10 Essential (primary) hypertension: Secondary | ICD-10-CM | POA: Diagnosis not present

## 2018-10-08 DIAGNOSIS — C541 Malignant neoplasm of endometrium: Secondary | ICD-10-CM | POA: Diagnosis not present

## 2018-10-08 DIAGNOSIS — Z853 Personal history of malignant neoplasm of breast: Secondary | ICD-10-CM | POA: Diagnosis not present

## 2018-11-05 DIAGNOSIS — C541 Malignant neoplasm of endometrium: Secondary | ICD-10-CM | POA: Diagnosis not present

## 2018-11-05 DIAGNOSIS — Z5111 Encounter for antineoplastic chemotherapy: Secondary | ICD-10-CM | POA: Diagnosis not present

## 2018-11-26 DIAGNOSIS — Z5112 Encounter for antineoplastic immunotherapy: Secondary | ICD-10-CM | POA: Diagnosis not present

## 2018-11-26 DIAGNOSIS — G629 Polyneuropathy, unspecified: Secondary | ICD-10-CM | POA: Diagnosis not present

## 2018-11-26 DIAGNOSIS — F329 Major depressive disorder, single episode, unspecified: Secondary | ICD-10-CM | POA: Diagnosis not present

## 2018-11-26 DIAGNOSIS — Z90710 Acquired absence of both cervix and uterus: Secondary | ICD-10-CM | POA: Diagnosis not present

## 2018-11-26 DIAGNOSIS — C541 Malignant neoplasm of endometrium: Secondary | ICD-10-CM | POA: Diagnosis not present

## 2018-11-26 DIAGNOSIS — Z90722 Acquired absence of ovaries, bilateral: Secondary | ICD-10-CM | POA: Diagnosis not present

## 2018-11-30 ENCOUNTER — Other Ambulatory Visit: Payer: Self-pay | Admitting: Oncology

## 2018-11-30 DIAGNOSIS — Z853 Personal history of malignant neoplasm of breast: Secondary | ICD-10-CM

## 2018-12-04 ENCOUNTER — Other Ambulatory Visit: Payer: Self-pay | Admitting: *Deleted

## 2018-12-04 MED ORDER — ANASTROZOLE 1 MG PO TABS: 1.0000 mg | ORAL_TABLET | Freq: Every day | ORAL | 0 refills | Status: DC

## 2018-12-04 NOTE — Telephone Encounter (Signed)
This RN received VM from pt stating she called in a refill for her Anastrazole to [facility] 2 days ago " and it has not been refilled and now it is the weekend and I am out of medication "  Noted pt needs appointment per cancelled appointment Oct 2019 by patient with no further appointments scheduled.  90 day refill given as well as request sent to scheduling for appointment for follow up.  Call returned to pt to inform her of above-message left on VM

## 2019-01-01 ENCOUNTER — Telehealth: Payer: Self-pay | Admitting: Oncology

## 2019-01-01 NOTE — Telephone Encounter (Signed)
Returned call re scheduling appointment. Gave patient lab/fu 6/8. Rescheduled from October 2019. Per patient everything going on she does want to come now but needs to get something on schedule.

## 2019-01-04 DIAGNOSIS — Z01810 Encounter for preprocedural cardiovascular examination: Secondary | ICD-10-CM | POA: Diagnosis not present

## 2019-01-04 DIAGNOSIS — C541 Malignant neoplasm of endometrium: Secondary | ICD-10-CM | POA: Diagnosis not present

## 2019-01-07 DIAGNOSIS — Z5112 Encounter for antineoplastic immunotherapy: Secondary | ICD-10-CM | POA: Diagnosis not present

## 2019-01-07 DIAGNOSIS — F329 Major depressive disorder, single episode, unspecified: Secondary | ICD-10-CM | POA: Diagnosis not present

## 2019-01-07 DIAGNOSIS — G629 Polyneuropathy, unspecified: Secondary | ICD-10-CM | POA: Diagnosis not present

## 2019-01-07 DIAGNOSIS — Z90710 Acquired absence of both cervix and uterus: Secondary | ICD-10-CM | POA: Diagnosis not present

## 2019-01-07 DIAGNOSIS — Z79899 Other long term (current) drug therapy: Secondary | ICD-10-CM | POA: Diagnosis not present

## 2019-01-07 DIAGNOSIS — Z90722 Acquired absence of ovaries, bilateral: Secondary | ICD-10-CM | POA: Diagnosis not present

## 2019-01-07 DIAGNOSIS — C541 Malignant neoplasm of endometrium: Secondary | ICD-10-CM | POA: Diagnosis not present

## 2019-01-07 DIAGNOSIS — I10 Essential (primary) hypertension: Secondary | ICD-10-CM | POA: Diagnosis not present

## 2019-01-07 DIAGNOSIS — Z853 Personal history of malignant neoplasm of breast: Secondary | ICD-10-CM | POA: Diagnosis not present

## 2019-01-28 DIAGNOSIS — C541 Malignant neoplasm of endometrium: Secondary | ICD-10-CM | POA: Diagnosis not present

## 2019-01-28 DIAGNOSIS — I10 Essential (primary) hypertension: Secondary | ICD-10-CM | POA: Diagnosis not present

## 2019-01-28 DIAGNOSIS — Z90710 Acquired absence of both cervix and uterus: Secondary | ICD-10-CM | POA: Diagnosis not present

## 2019-01-28 DIAGNOSIS — Z90722 Acquired absence of ovaries, bilateral: Secondary | ICD-10-CM | POA: Diagnosis not present

## 2019-01-28 DIAGNOSIS — Z79899 Other long term (current) drug therapy: Secondary | ICD-10-CM | POA: Diagnosis not present

## 2019-01-28 DIAGNOSIS — G629 Polyneuropathy, unspecified: Secondary | ICD-10-CM | POA: Diagnosis not present

## 2019-01-28 DIAGNOSIS — Z5112 Encounter for antineoplastic immunotherapy: Secondary | ICD-10-CM | POA: Diagnosis not present

## 2019-01-28 DIAGNOSIS — Z853 Personal history of malignant neoplasm of breast: Secondary | ICD-10-CM | POA: Diagnosis not present

## 2019-01-28 DIAGNOSIS — F329 Major depressive disorder, single episode, unspecified: Secondary | ICD-10-CM | POA: Diagnosis not present

## 2019-02-02 ENCOUNTER — Ambulatory Visit
Admission: RE | Admit: 2019-02-02 | Discharge: 2019-02-02 | Disposition: A | Discharge status: Home / Self Care | Payer: Medicare HMO | Source: Ambulatory Visit | Attending: Oncology | Admitting: Oncology

## 2019-02-02 ENCOUNTER — Other Ambulatory Visit: Payer: Self-pay

## 2019-02-02 DIAGNOSIS — Z853 Personal history of malignant neoplasm of breast: Secondary | ICD-10-CM

## 2019-02-10 DIAGNOSIS — J4520 Mild intermittent asthma, uncomplicated: Secondary | ICD-10-CM | POA: Diagnosis not present

## 2019-02-10 DIAGNOSIS — E559 Vitamin D deficiency, unspecified: Secondary | ICD-10-CM | POA: Diagnosis not present

## 2019-02-10 DIAGNOSIS — M810 Age-related osteoporosis without current pathological fracture: Secondary | ICD-10-CM | POA: Diagnosis not present

## 2019-02-10 DIAGNOSIS — E039 Hypothyroidism, unspecified: Secondary | ICD-10-CM | POA: Diagnosis not present

## 2019-02-10 DIAGNOSIS — C541 Malignant neoplasm of endometrium: Secondary | ICD-10-CM | POA: Diagnosis not present

## 2019-02-10 DIAGNOSIS — C50919 Malignant neoplasm of unspecified site of unspecified female breast: Secondary | ICD-10-CM | POA: Diagnosis not present

## 2019-02-11 DIAGNOSIS — G629 Polyneuropathy, unspecified: Secondary | ICD-10-CM | POA: Diagnosis not present

## 2019-02-11 DIAGNOSIS — Z5112 Encounter for antineoplastic immunotherapy: Secondary | ICD-10-CM | POA: Diagnosis not present

## 2019-02-11 DIAGNOSIS — F329 Major depressive disorder, single episode, unspecified: Secondary | ICD-10-CM | POA: Diagnosis not present

## 2019-02-11 DIAGNOSIS — Z90710 Acquired absence of both cervix and uterus: Secondary | ICD-10-CM | POA: Diagnosis not present

## 2019-02-11 DIAGNOSIS — I10 Essential (primary) hypertension: Secondary | ICD-10-CM | POA: Diagnosis not present

## 2019-02-11 DIAGNOSIS — Z853 Personal history of malignant neoplasm of breast: Secondary | ICD-10-CM | POA: Diagnosis not present

## 2019-02-11 DIAGNOSIS — Z79899 Other long term (current) drug therapy: Secondary | ICD-10-CM | POA: Diagnosis not present

## 2019-02-11 DIAGNOSIS — C541 Malignant neoplasm of endometrium: Secondary | ICD-10-CM | POA: Diagnosis not present

## 2019-02-11 DIAGNOSIS — Z90722 Acquired absence of ovaries, bilateral: Secondary | ICD-10-CM | POA: Diagnosis not present

## 2019-02-18 DIAGNOSIS — Z90722 Acquired absence of ovaries, bilateral: Secondary | ICD-10-CM | POA: Diagnosis not present

## 2019-02-18 DIAGNOSIS — Z79899 Other long term (current) drug therapy: Secondary | ICD-10-CM | POA: Diagnosis not present

## 2019-02-18 DIAGNOSIS — Z90710 Acquired absence of both cervix and uterus: Secondary | ICD-10-CM | POA: Diagnosis not present

## 2019-02-18 DIAGNOSIS — I10 Essential (primary) hypertension: Secondary | ICD-10-CM | POA: Diagnosis not present

## 2019-02-18 DIAGNOSIS — C541 Malignant neoplasm of endometrium: Secondary | ICD-10-CM | POA: Diagnosis not present

## 2019-02-18 DIAGNOSIS — Z853 Personal history of malignant neoplasm of breast: Secondary | ICD-10-CM | POA: Diagnosis not present

## 2019-02-18 DIAGNOSIS — G629 Polyneuropathy, unspecified: Secondary | ICD-10-CM | POA: Diagnosis not present

## 2019-02-18 DIAGNOSIS — Z5112 Encounter for antineoplastic immunotherapy: Secondary | ICD-10-CM | POA: Diagnosis not present

## 2019-02-18 DIAGNOSIS — F329 Major depressive disorder, single episode, unspecified: Secondary | ICD-10-CM | POA: Diagnosis not present

## 2019-02-24 DIAGNOSIS — H52203 Unspecified astigmatism, bilateral: Secondary | ICD-10-CM | POA: Diagnosis not present

## 2019-02-24 DIAGNOSIS — H26493 Other secondary cataract, bilateral: Secondary | ICD-10-CM | POA: Diagnosis not present

## 2019-02-24 DIAGNOSIS — H04123 Dry eye syndrome of bilateral lacrimal glands: Secondary | ICD-10-CM | POA: Diagnosis not present

## 2019-02-24 DIAGNOSIS — H43813 Vitreous degeneration, bilateral: Secondary | ICD-10-CM | POA: Diagnosis not present

## 2019-03-01 ENCOUNTER — Inpatient Hospital Stay: Payer: Medicare HMO | Attending: Oncology | Admitting: Oncology

## 2019-03-01 ENCOUNTER — Inpatient Hospital Stay: Payer: Medicare HMO

## 2019-03-01 DIAGNOSIS — M818 Other osteoporosis without current pathological fracture: Secondary | ICD-10-CM

## 2019-03-01 DIAGNOSIS — Z87891 Personal history of nicotine dependence: Secondary | ICD-10-CM | POA: Diagnosis not present

## 2019-03-01 DIAGNOSIS — Z923 Personal history of irradiation: Secondary | ICD-10-CM

## 2019-03-01 DIAGNOSIS — Z79899 Other long term (current) drug therapy: Secondary | ICD-10-CM | POA: Diagnosis not present

## 2019-03-01 DIAGNOSIS — C50512 Malignant neoplasm of lower-outer quadrant of left female breast: Secondary | ICD-10-CM | POA: Diagnosis not present

## 2019-03-01 DIAGNOSIS — Z79811 Long term (current) use of aromatase inhibitors: Secondary | ICD-10-CM | POA: Diagnosis not present

## 2019-03-01 DIAGNOSIS — Z170 Estrogen receptor positive status [ER+]: Secondary | ICD-10-CM | POA: Diagnosis not present

## 2019-03-01 MED ORDER — ANASTROZOLE 1 MG PO TABS: 1.0000 mg | ORAL_TABLET | Freq: Every day | ORAL | 4 refills | Status: DC

## 2019-03-01 NOTE — Progress Notes (Signed)
[place]  [phone] [phone]     ID: Lucita Ferrara DOB: 05/09/1941  MR#: [id number]  HUT#:[id number]  Patient Care Team: Josetta Huddle, MD as PCP - General (Internal Medicine) Rolm Bookbinder, MD as Consulting Physician (General Surgery) , Virgie Dad, MD as Consulting Physician (Oncology) Gery Pray, MD as Consulting Physician (Radiation Oncology) Polly Cobia Dayton Bailiff, MD as Referring Physician (Obstetrics and Gynecology) OTHER MD:  I connected with Lucita Ferrara on 03/01/19 at 10:30 AM EDT by telephone visit and verified that I am speaking with the correct person using two identifiers.   I discussed the limitations, risks, security and privacy concerns of performing an evaluation and management service by telemedicine and the availability of in-person appointments. I also discussed with the patient that there may be a patient responsible charge related to this service. The patient expressed understanding and agreed to proceed.   Other persons participating in the visit and their role in the encounter: Wilburn Mylar, scribe   Patient's location: home  Provider's location: [place]    CHIEF COMPLAINT: Estrogen receptor positive breast cancer; endometrial cancer  CURRENT TREATMENT: anastrozole   INTERVAL HISTORY: [name] was called today for follow-up of her estrogen receptor positive breast cancer.   She continues on anastrozole, with remarkably good tolerance. Hot flashes and vaginal dryness are not a major issue. She obtains it at a good price.  Since her last visit, she underwent bilateral diagnostic mammography with tomography at [facility]. Her most recent scan on on 02/02/2019 showing: breast density category B; no evidence of malignancy in either breast.  She also completed external beam radiation to the pelvis (07/14/2017 - 08/19/2017) and brachytherapy to the vaginal cuff (12/5, 12/13, 09/11/2017).  She tolerated this well overall.   She continues on herceptin under Dr. Polly Cobia, which she receives every 3 months.  She states she tolerates very well.  She has echocardiograms every 3 months as well.   REVIEW OF SYSTEMS: [name] has had an eventful last two years. She experienced a fall and broke her sacrum in 12/09/2017. Her husband passed away on New Years Eve 2019. She lives by herself now and cares for herself. She walks for exercise. She continues to experience some neuropathy in her hands.  The patient denies unusual headaches, visual changes, nausea, vomiting, stiff neck, dizziness, or gait imbalance. There has been no cough, phlegm production, or pleurisy, no chest pain or pressure, and no change in bowel or bladder habits. The patient denies fever, rash, bleeding, unexplained fatigue or unexplained weight loss. A detailed review of systems was otherwise entirely negative.   BREAST CANCER HISTORY: From the original intake note:   "[name]" had routine screening mammography with tomography of [facility] 10/31/2015. A possible mass associated with calcifications was noted in the left breast. Accordingly the patient proceeded to digital diagnostic left mammography and ultrasonography the same day. Breast density was category B. In the left breast that was a small ill-defined mass at approximately 4:00 measuring 7 mm mammographically. Also there was a small group of heterogeneous calcifications in a linear configuration in the upper outer quadrant of the left breast, at the 12:30 o'clock position. The calcifications spanned a 0.4 cm. There were approximately 3 cm superior to the mass. There was a second group of heterogeneous calcifications approximately 1 cm posterior and lateral to the mass. These spanned 1.1 cm. The distance between both groups of calcifications (with a mass in between) was 4.5 cm.  I examined her was no palpable abnormality. By ultrasonography there was a small irregular  hypoechoic mass at the 3:30 o'clock position of the left breast measuring 0.6 cm maximally. Ultrasound of the axilla was unremarkable.  On the same day the patient underwent biopsy of the left breast mass in question and also the calcifications at the 12:30 o'clock position. The breast mass ([id number]) proved to be an invasive ductal carcinoma, grade 1, estrogen receptor 100% positive, progesterone receptor 30% positive, both with strong staining intensity, with an MIB-1 of 10%, and no HER-2/neu amplification, the signals ratio being 1.50 and the number Purcell 2.25.  The area of calcifications at 12:30 o'clock showed usual ductal hyperplasia without atypia.  On 11/06/2015 [name] underwent biopsy of a second area of calcifications less than 2 cm away from the primary mass and this showed (S [id number]) atypical ductal hyperplasia.  ENDOMETRIAL CANCER HISTORY: From follow up note on 06/30/2017:  She had some spotting, brought this to medical attention, and had an endometrial biopsy 03/11/2017 which was nondiagnostic, but transvaginal ultrasound at the same time showed a fluid filled endometrial cavity and a 3.6 cm hyperechoic mass on the posterior wall. There was also a separate 1.17 m hyperechoic mass on the anterior wall. Neither of these were vascular. Altogether the endometrium measured 4.35 cm. There was also a questionable cervical mass.  The patient was referred to Dr. Polly Cobia and after appropriate discussion underwent robotic hysterectomy with bilateral salpingo-oophorectomy and sentinel lymph node sampling on 04/01/2017. The final pathology (S and [id number]) showed 2 areas of high-grade endometrial adenocarcinoma, serous type, measuring 2.5 and 1.5 cm, with no definitive myometrial invasion seen. The ovaries and fallopian tubes were benign. However 2 right obturator lymph nodes and 2 left obturator lymph nodes were all involved by disease.  Additional tests on the tumor tissue found retained  nuclear expression for all 4 mismatch repair proteins tested (ML H1, PMS 2, [place] 2, MSH 6) and estrogen receptor positivity in the week intermediate range at 90%, with less than 1% progesterone receptor, but HER-2/neu 3+ by immunohistochemistry.  She was treated with IMRT and HDR radiation treatments under Dr. Sondra Come followed by Herceptin under Dr. Polly Cobia.  Subsequent history is as detailed below.   PAST MEDICAL HISTORY: Past Medical History:  Diagnosis Date  . Breast cancer ([place])   . Breast cancer of lower-outer quadrant of left female breast ([place]) 11/02/2015  . Endometrial cancer ([place])   . History of brachytherapy 08/27/17-09/11/17   vaginal cuff 18 Gy in 3 fractions  . History of radiation therapy 07/14/2017-08/19/17   pelvis 45 Gy in 25 fractions  . Hx of radiation therapy 01/09/16- 02/06/16   Left Breast  . Personal history of chemotherapy 04/2017   for uterine ca  . Personal history of radiation therapy 2017   for breast ca  . Personal history of radiation therapy 2018   for uterine ca    PAST SURGICAL HISTORY: Past Surgical History:  Procedure Laterality Date  . BREAST BIOPSY Left 11/06/2015   benign  . BREAST BIOPSY Left 10/31/2015   malignant  . BREAST BIOPSY Left 10/31/2015   benign  . BREAST BIOPSY Left 10/15/2013   benign  . BREAST LUMPECTOMY Left   . herniated disc    . RADIOACTIVE SEED GUIDED PARTIAL MASTECTOMY WITH AXILLARY SENTINEL LYMPH NODE BIOPSY Left 11/27/2015   Procedure: LEFT BREAST SEED AND WIRE GUIDED LUMPECTOMY WITH LEFT AXILLARY LYMPH NODE BIOPSY;  Surgeon: Rolm Bookbinder, MD;  Location: [facility];  Service: General;  Laterality: Left;  . ROBOTIC ASSISTED TOTAL HYSTERECTOMY WITH BILATERAL SALPINGO OOPHERECTOMY  04/01/2017   ROBOTIC HYSTERECTOMY WITH BSO, SENTINEL NODE MAPPING AND D&C by Dr. Polly Cobia  . TONSILLECTOMY      FAMILY HISTORY Family History  Problem Relation Age of Onset  . Brain cancer Sister   . Ovarian cancer  Maternal Aunt   . Breast cancer Other 42    the patient's father died from heart disease at the age of 34. The patient's mother died from heart failure at the age of 13. The patient had one brother who was shot in an accident at the age of 77. She has 1 sister. On the mother's side and aunt at age 24 was diagnosed with ovarian cancer. The patient's own sister was diagnosed with a brain tumor at the age of 73 and died 22 years later.  GYNECOLOGIC HISTORY:  No LMP recorded. Patient is postmenopausal. Menarche age 83, first live birth age 19, the patient is [place] P3. She stopped having periods in 1997. She never used hormone replacement. She never used oral contraceptives.  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 Her husband Silvestre Moment worked in [employer] fund[employer] for many years and then started the [employer] here in town. He passed away 10/17/18. Daughter KAHLIA LAGUNES is a homemaker in [place]. Son POLLYANNA LEVAY the third lives in [place] were he works in [place] for [employer]. Daughter Blane Ohara lives in a [place] of where she works as an Immunologist. The patient has 10 grandchildren (7 blood and 3 step). She attends [employer]   HEALTH MAINTENANCE: Social History   Socioeconomic History  . Marital status: Married    Spouse name: Not on file  . Number of children: Not on file  . Years of education: Not on file  . Highest education level: Not on file  Occupational History  . Not on file  Social Needs  . Financial resource strain: Not on file  . Food insecurity:    Worry: Not on file    Inability: Not on file  . Transportation needs:    Medical: Not on file    Non-medical: Not on file  Tobacco Use  . Smoking status: Former Smoker    Types: Cigarettes    Last attempt to quit: 07/02/1982    Years since quitting: 36.6  . Smokeless tobacco: Never Used  Substance and  Sexual Activity  . Alcohol use: Yes    Alcohol/week: 3.0 standard drinks    Types: 3 Glasses of wine per week  . Drug use: No  . Sexual activity: Not on file  Lifestyle  . Physical activity:    Days per week: Not on file    Minutes per session: Not on file  . Stress: Not on file  Relationships  . Social connections:    Talks on phone: Not on file    Gets together: Not on file    Attends religious service: Not on file    Active member of club or organization: Not on file    Attends meetings of clubs or organizations: Not on file    Relationship status: Not on file  Other Topics Concern  . Not on file  Social History Narrative  . Not on file    Colonoscopy: 2011?  PAP:  Bone density: remote  Lipid panel:  No Known  Allergies  Current Outpatient Medications  Medication Sig Dispense Refill  . acetaminophen (TYLENOL) 500 MG tablet Take 500 mg by mouth every 6 (six) hours as needed.    Mar[place] Kitchen anastrozole (ARIMIDEX) 1 MG tablet Take 1 tablet (1 mg total) by mouth daily. 90 tablet 4  . Biotin 5000 MCG TABS Take 5,000 mcg by mouth daily.     . calcium carbonate (OS-CAL) 600 MG TABS tablet Take by mouth.    . cholecalciferol (VITAMIN D) 1000 units tablet Take 1,000 Units by mouth daily.    Marland Kitchen escitalopram (LEXAPRO) 20 MG tablet Take 20 mg by mouth daily.   11  . levothyroxine (SYNTHROID, LEVOTHROID) 88 MCG tablet TK 1 T PO QAM OES  11   No current facility-administered medications for this visit.    Facility-Administered Medications Ordered in Other Visits  Medication Dose Route Frequency Provider Last Rate Last Dose  . sodium chloride flush (NS) 0.9 % injection 10 mL  10 mL Intravenous PRN Gery Pray, MD   10 mL at 09/09/17 1351    Objective: Middle-aged white woman in no acute distress  There were no vitals filed for this visit. There is no height or weight on file to calculate BMI.  . Wt Readings from Last 3 Encounters:  04/06/18 146 lb (66.2 kg)  03/19/18 143 lb 6.4 oz (65  kg)  02/10/18 141 lb 3.2 oz (64 kg)   ECOG: 1 - Symptomatic but completely ambulatory  LAB RESULTS:  CMP  No results found for this or any previous visit (from the past 2160 hour(s)).  No results found for: TOTALPROTELP, ALBUMINELP, A1GS, A2GS, BETS, BETA2SER, GAMS, MSPIKE, SPEI  No results found for: TOTALPROTELP, ALBUMINELP, A2GS, BETS, BETA2SER, GAMS, MSPIKE, SPEI   No results found for: LABCA2   No components found for: [id number]  No results for input(s): INR in the last 168 hours.  Urinalysis    Component Value Date/Time   COLORURINE RED BIOCHEMICALS MAY BE AFFECTED BY COLOR (A) 04/26/2008 0455   APPEARANCEUR CLOUDY (A) 04/26/2008 0455   LABSPEC 1.021 04/26/2008 0455   PHURINE 7.0 04/26/2008 0455   GLUCOSEU NEGATIVE 04/26/2008 0455   HGBUR LARGE (A) 04/26/2008 0455   BILIRUBINUR NEGATIVE 04/26/2008 0455   KETONESUR 15 (A) 04/26/2008 0455   PROTEINUR 30 (A) 04/26/2008 0455   UROBILINOGEN 1.0 04/26/2008 0455   NITRITE NEGATIVE 04/26/2008 0455   LEUKOCYTESUR MODERATE (A) 04/26/2008 0455     STUDIES: Mm Diag Breast Tomo Bilateral  Result Date: 02/02/2019 CLINICAL DATA:  78 year old female presenting for routine annual surveillance status post left breast lumpectomy in 2017. EXAM: DIGITAL DIAGNOSTIC BILATERAL MAMMOGRAM WITH CAD AND TOMO COMPARISON:  Previous exam(s). ACR Breast Density Category b: There are scattered areas of fibroglandular density. FINDINGS: The left breast lumpectomy site is stable. No suspicious calcifications, masses or areas of distortion are seen in the bilateral breasts. Mammographic images were processed with CAD. IMPRESSION: Stable left breast lumpectomy site. No mammographic evidence of malignancy in the bilateral breasts. RECOMMENDATION: Diagnostic mammogram is suggested in 1 year. (Code:[id number]-B-01Y) I have discussed the findings and recommendations with the patient. Results were also provided in writing at the conclusion of the visit. If applicable,  a reminder letter will be sent to the patient regarding the next appointment. BI-RADS CATEGORY  2: Benign. Electronically Signed   By: Ammie Ferrier M.D.   On: 02/02/2019 11:32    ASSESSMENT: 78 y.o. [place] woman status post left breast lower outer quadrant biopsy 10/31/2015 for a clinical  T1b N0, stage IA  invasive ductal carcinoma, grade 1, estrogen and progesterone receptor positive, HER-2 nonamplified, with an MIB-1 of 10%  (a) biopsy of an area of calcifications 10/31/2015 showed usual ductal hyperplasia  (b) biopsy of a second area of calcifications 1.8 cm from the breast mass 11/06/2015 showed atypical ductal hyperplasia  (1) status post left lumpectomy 11/27/2015 for a pT2 cN0, stage IIA invasive ductal carcinoma, grade 1, repeat HER-2 again negative.  (2) adjuvant radiation 01-09-16 to 02-06-16: Left Breast / 40.05 Gy in 15 fractions, Left Breast Boost / 10 Gy in 5 fractions  (3) anastrozole started 03/18/2016  (a) bone density 12/04/2015 shows a T score of -1.9  (4) the patient opted against genetic testing  (5) endometrial carcinoma, high grade,  pT1a pN1, status post hysterectomy with bilateral salpingo-oophorectomy 04/01/2017  (a) adjuvant chemotherapy with carboplatin, paclitaxel, and trastuzumab started 04/24/2017  (b) adjuvant radiation    1. 07/14/2017 - 08/19/2017: Pelvis / 45 Gy in 25 fractions   2. 08/27/2017, 09/04/2017, 09/11/2017: Brachytherapy Boost to vaginal cuff / 18 Gy in 3 fractions  (c) continuing trastuzumab every 3 months  PLAN: [name] is now a little over 3 years out from definitive surgery for her breast cancer with no evidence of disease recurrence.  This is very favorable.  The plan is to continue anastrozole for a total of 5 years, which means 2 years more.  She will need a bone density next year and I will arrange for that at the same time as her next mammogram.  Her endometrial carcinoma was treated and is being treated by Dr. Polly Cobia.  The  patient is very pleased with the treatment she has received there and the continuing care she is receiving.  She will return to see me in 1 year.  She knows to call for any other issue that may develop before that visit.   Chauncey Cruel, MD  03/01/19 10:46 AM  Medical Oncology and Hematology [facility] [address] [place], [place] [address] Tel. [phone]    Fax. [phone]   I, Wilburn Mylar, am acting as scribe for Dr. Virgie Dad. .  I, Lurline Del MD, have reviewed the above documentation for accuracy and completeness, and I agree with the above.

## 2019-03-11 DIAGNOSIS — Z5111 Encounter for antineoplastic chemotherapy: Secondary | ICD-10-CM | POA: Diagnosis not present

## 2019-03-11 DIAGNOSIS — C541 Malignant neoplasm of endometrium: Secondary | ICD-10-CM | POA: Diagnosis not present

## 2019-03-25 DIAGNOSIS — Z5112 Encounter for antineoplastic immunotherapy: Secondary | ICD-10-CM | POA: Diagnosis not present

## 2019-03-25 DIAGNOSIS — G629 Polyneuropathy, unspecified: Secondary | ICD-10-CM | POA: Diagnosis not present

## 2019-03-25 DIAGNOSIS — C541 Malignant neoplasm of endometrium: Secondary | ICD-10-CM | POA: Diagnosis not present

## 2019-03-25 DIAGNOSIS — F329 Major depressive disorder, single episode, unspecified: Secondary | ICD-10-CM | POA: Diagnosis not present

## 2019-03-31 DIAGNOSIS — Z5112 Encounter for antineoplastic immunotherapy: Secondary | ICD-10-CM | POA: Diagnosis not present

## 2019-03-31 DIAGNOSIS — G629 Polyneuropathy, unspecified: Secondary | ICD-10-CM | POA: Diagnosis not present

## 2019-03-31 DIAGNOSIS — F329 Major depressive disorder, single episode, unspecified: Secondary | ICD-10-CM | POA: Diagnosis not present

## 2019-03-31 DIAGNOSIS — C541 Malignant neoplasm of endometrium: Secondary | ICD-10-CM | POA: Diagnosis not present

## 2019-04-22 DIAGNOSIS — C541 Malignant neoplasm of endometrium: Secondary | ICD-10-CM | POA: Diagnosis not present

## 2019-04-22 DIAGNOSIS — G629 Polyneuropathy, unspecified: Secondary | ICD-10-CM | POA: Diagnosis not present

## 2019-04-22 DIAGNOSIS — Z5112 Encounter for antineoplastic immunotherapy: Secondary | ICD-10-CM | POA: Diagnosis not present

## 2019-04-22 DIAGNOSIS — F329 Major depressive disorder, single episode, unspecified: Secondary | ICD-10-CM | POA: Diagnosis not present

## 2019-05-05 DIAGNOSIS — Z20828 Contact with and (suspected) exposure to other viral communicable diseases: Secondary | ICD-10-CM | POA: Diagnosis not present

## 2019-05-12 DIAGNOSIS — K449 Diaphragmatic hernia without obstruction or gangrene: Secondary | ICD-10-CM | POA: Diagnosis not present

## 2019-05-12 DIAGNOSIS — Z08 Encounter for follow-up examination after completed treatment for malignant neoplasm: Secondary | ICD-10-CM | POA: Diagnosis not present

## 2019-05-12 DIAGNOSIS — N281 Cyst of kidney, acquired: Secondary | ICD-10-CM | POA: Diagnosis not present

## 2019-05-12 DIAGNOSIS — C541 Malignant neoplasm of endometrium: Secondary | ICD-10-CM | POA: Diagnosis not present

## 2019-05-12 DIAGNOSIS — K4091 Unilateral inguinal hernia, without obstruction or gangrene, recurrent: Secondary | ICD-10-CM | POA: Diagnosis not present

## 2019-05-12 DIAGNOSIS — C569 Malignant neoplasm of unspecified ovary: Secondary | ICD-10-CM | POA: Diagnosis not present

## 2019-05-12 DIAGNOSIS — K7689 Other specified diseases of liver: Secondary | ICD-10-CM | POA: Diagnosis not present

## 2019-05-13 DIAGNOSIS — C541 Malignant neoplasm of endometrium: Secondary | ICD-10-CM | POA: Diagnosis not present

## 2019-05-13 DIAGNOSIS — Z5111 Encounter for antineoplastic chemotherapy: Secondary | ICD-10-CM | POA: Diagnosis not present

## 2019-06-03 DIAGNOSIS — Z5112 Encounter for antineoplastic immunotherapy: Secondary | ICD-10-CM | POA: Diagnosis not present

## 2019-06-03 DIAGNOSIS — C541 Malignant neoplasm of endometrium: Secondary | ICD-10-CM | POA: Diagnosis not present

## 2019-06-07 IMAGING — DX DG HIP (WITH OR WITHOUT PELVIS) 2-3V*L*
2 series · 2 of 2 positions shown · non-contrast
Comparison: None.

CLINICAL DATA: Left hip pain for 1 week with history of fall

EXAM:
DG HIP (WITH OR WITHOUT PELVIS) 2-3V LEFT

[pelvis ap]
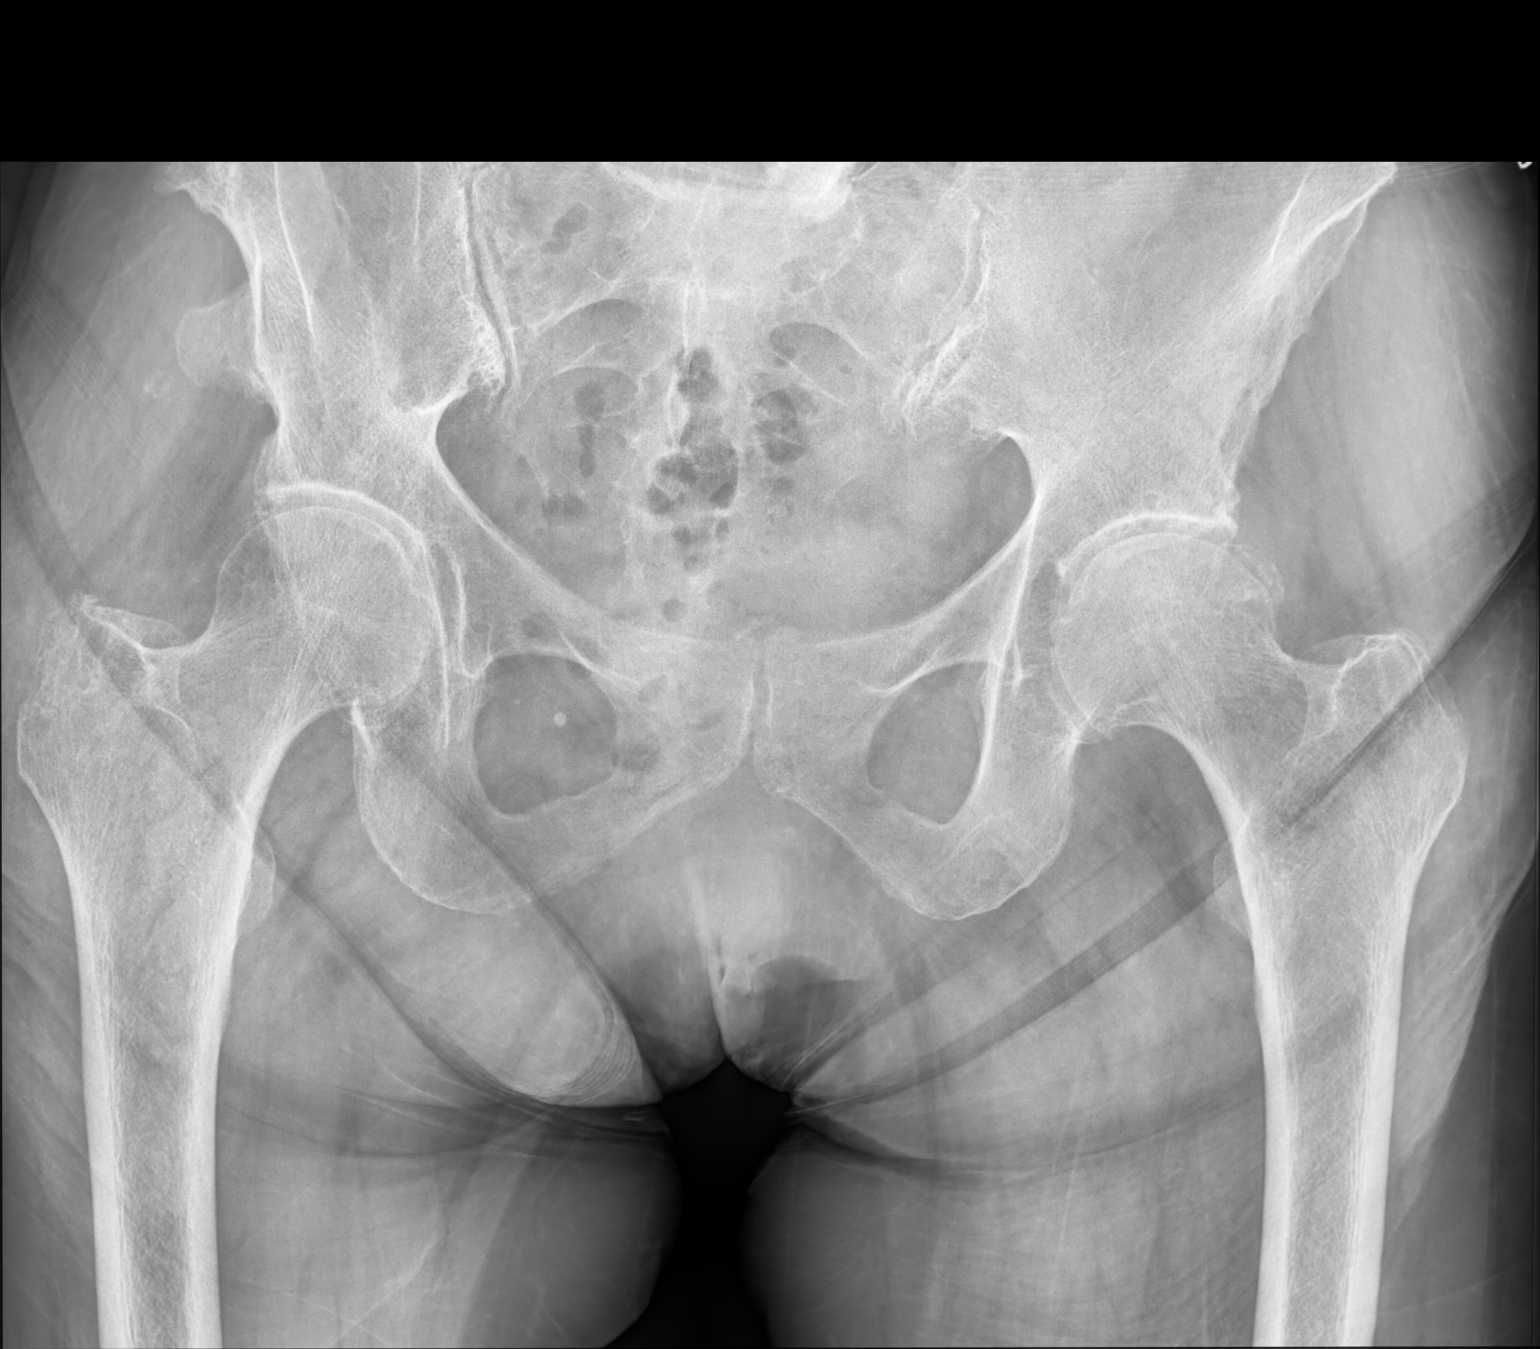

[hip joint (frog view)]
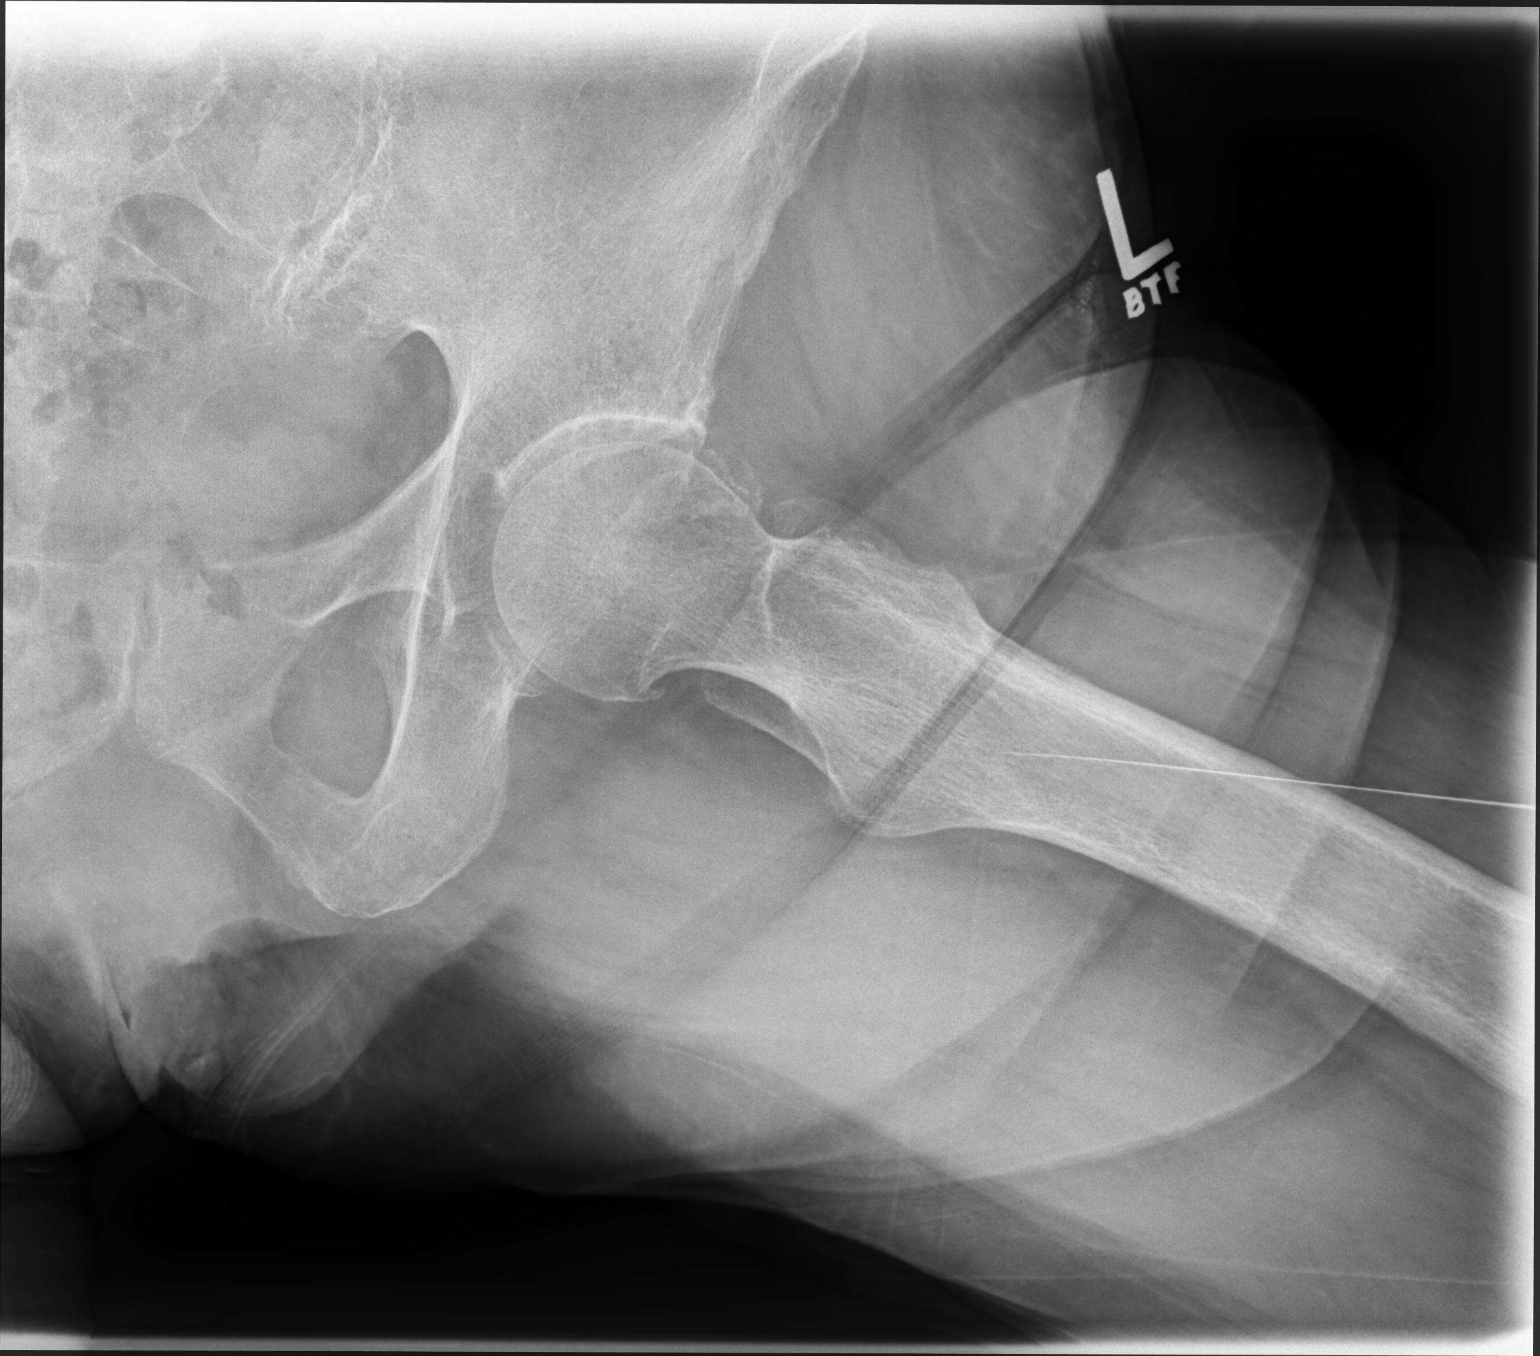

[2 of 2 positions shown; findings below may reference images not displayed]

FINDINGS: Bilateral SI joint degenerative changes. The pubic symphysis and
rami are intact. Right femoral head projects in joint. Left femoral
head projects in joint. No acute displaced fracture or malalignment
is seen. Mild arthritis of both hips.
IMPRESSION: Mild degenerative changes.  No acute osseous abnormality.

## 2019-06-24 DIAGNOSIS — E039 Hypothyroidism, unspecified: Secondary | ICD-10-CM | POA: Diagnosis not present

## 2019-06-24 DIAGNOSIS — G629 Polyneuropathy, unspecified: Secondary | ICD-10-CM | POA: Diagnosis not present

## 2019-06-24 DIAGNOSIS — I10 Essential (primary) hypertension: Secondary | ICD-10-CM | POA: Diagnosis not present

## 2019-06-24 DIAGNOSIS — C541 Malignant neoplasm of endometrium: Secondary | ICD-10-CM | POA: Diagnosis not present

## 2019-06-24 DIAGNOSIS — Z90722 Acquired absence of ovaries, bilateral: Secondary | ICD-10-CM | POA: Diagnosis not present

## 2019-06-24 DIAGNOSIS — F329 Major depressive disorder, single episode, unspecified: Secondary | ICD-10-CM | POA: Diagnosis not present

## 2019-06-24 DIAGNOSIS — Z5112 Encounter for antineoplastic immunotherapy: Secondary | ICD-10-CM | POA: Diagnosis not present

## 2019-06-24 DIAGNOSIS — Z9079 Acquired absence of other genital organ(s): Secondary | ICD-10-CM | POA: Diagnosis not present

## 2019-06-24 DIAGNOSIS — Z90710 Acquired absence of both cervix and uterus: Secondary | ICD-10-CM | POA: Diagnosis not present

## 2019-07-01 DIAGNOSIS — Z23 Encounter for immunization: Secondary | ICD-10-CM | POA: Diagnosis not present

## 2019-07-01 DIAGNOSIS — Z1389 Encounter for screening for other disorder: Secondary | ICD-10-CM | POA: Diagnosis not present

## 2019-07-01 DIAGNOSIS — E039 Hypothyroidism, unspecified: Secondary | ICD-10-CM | POA: Diagnosis not present

## 2019-07-01 DIAGNOSIS — E559 Vitamin D deficiency, unspecified: Secondary | ICD-10-CM | POA: Diagnosis not present

## 2019-07-01 DIAGNOSIS — Z0001 Encounter for general adult medical examination with abnormal findings: Secondary | ICD-10-CM | POA: Diagnosis not present

## 2019-07-01 DIAGNOSIS — H6123 Impacted cerumen, bilateral: Secondary | ICD-10-CM | POA: Diagnosis not present

## 2019-07-01 DIAGNOSIS — C541 Malignant neoplasm of endometrium: Secondary | ICD-10-CM | POA: Diagnosis not present

## 2019-07-01 DIAGNOSIS — C50919 Malignant neoplasm of unspecified site of unspecified female breast: Secondary | ICD-10-CM | POA: Diagnosis not present

## 2019-07-22 DIAGNOSIS — F329 Major depressive disorder, single episode, unspecified: Secondary | ICD-10-CM | POA: Diagnosis not present

## 2019-07-22 DIAGNOSIS — Z9079 Acquired absence of other genital organ(s): Secondary | ICD-10-CM | POA: Diagnosis not present

## 2019-07-22 DIAGNOSIS — Z90710 Acquired absence of both cervix and uterus: Secondary | ICD-10-CM | POA: Diagnosis not present

## 2019-07-22 DIAGNOSIS — Z90722 Acquired absence of ovaries, bilateral: Secondary | ICD-10-CM | POA: Diagnosis not present

## 2019-07-22 DIAGNOSIS — I10 Essential (primary) hypertension: Secondary | ICD-10-CM | POA: Diagnosis not present

## 2019-07-22 DIAGNOSIS — Z5112 Encounter for antineoplastic immunotherapy: Secondary | ICD-10-CM | POA: Diagnosis not present

## 2019-07-22 DIAGNOSIS — E039 Hypothyroidism, unspecified: Secondary | ICD-10-CM | POA: Diagnosis not present

## 2019-07-22 DIAGNOSIS — G629 Polyneuropathy, unspecified: Secondary | ICD-10-CM | POA: Diagnosis not present

## 2019-07-22 DIAGNOSIS — C541 Malignant neoplasm of endometrium: Secondary | ICD-10-CM | POA: Diagnosis not present

## 2019-08-12 DIAGNOSIS — C541 Malignant neoplasm of endometrium: Secondary | ICD-10-CM | POA: Diagnosis not present

## 2019-08-12 DIAGNOSIS — Z5111 Encounter for antineoplastic chemotherapy: Secondary | ICD-10-CM | POA: Diagnosis not present

## 2019-08-12 DIAGNOSIS — Z79899 Other long term (current) drug therapy: Secondary | ICD-10-CM | POA: Diagnosis not present

## 2019-08-30 DIAGNOSIS — Z08 Encounter for follow-up examination after completed treatment for malignant neoplasm: Secondary | ICD-10-CM | POA: Diagnosis not present

## 2019-08-30 DIAGNOSIS — K5730 Diverticulosis of large intestine without perforation or abscess without bleeding: Secondary | ICD-10-CM | POA: Diagnosis not present

## 2019-08-30 DIAGNOSIS — K449 Diaphragmatic hernia without obstruction or gangrene: Secondary | ICD-10-CM | POA: Diagnosis not present

## 2019-08-30 DIAGNOSIS — C541 Malignant neoplasm of endometrium: Secondary | ICD-10-CM | POA: Diagnosis not present

## 2019-08-30 DIAGNOSIS — N281 Cyst of kidney, acquired: Secondary | ICD-10-CM | POA: Diagnosis not present

## 2019-09-02 DIAGNOSIS — Z79899 Other long term (current) drug therapy: Secondary | ICD-10-CM | POA: Diagnosis not present

## 2019-09-02 DIAGNOSIS — Z5111 Encounter for antineoplastic chemotherapy: Secondary | ICD-10-CM | POA: Diagnosis not present

## 2019-09-02 DIAGNOSIS — C541 Malignant neoplasm of endometrium: Secondary | ICD-10-CM | POA: Diagnosis not present

## 2019-09-06 DIAGNOSIS — R590 Localized enlarged lymph nodes: Secondary | ICD-10-CM | POA: Diagnosis not present

## 2019-09-06 DIAGNOSIS — N281 Cyst of kidney, acquired: Secondary | ICD-10-CM | POA: Diagnosis not present

## 2019-09-06 DIAGNOSIS — R911 Solitary pulmonary nodule: Secondary | ICD-10-CM | POA: Diagnosis not present

## 2019-09-06 DIAGNOSIS — C541 Malignant neoplasm of endometrium: Secondary | ICD-10-CM | POA: Diagnosis not present

## 2019-09-09 DIAGNOSIS — Z79899 Other long term (current) drug therapy: Secondary | ICD-10-CM | POA: Diagnosis not present

## 2019-09-09 DIAGNOSIS — C541 Malignant neoplasm of endometrium: Secondary | ICD-10-CM | POA: Diagnosis not present

## 2019-09-12 DIAGNOSIS — Z20828 Contact with and (suspected) exposure to other viral communicable diseases: Secondary | ICD-10-CM | POA: Diagnosis not present

## 2019-09-22 NOTE — Progress Notes (Signed)
Radiation Oncology         [phone] ________________________________  Reevaluation note  Name: Autumn Frazier MRN: [id number]  Date: 09/27/2019  DOB: [birth year]  TM:[id number], Herbie [place], MD  Hart Rochester, MD   REFERRING PHYSICIAN: Hart Rochester, MD  DIAGNOSIS: The encounter diagnosis was Endometrial cancer [facility]).  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 female who is accompanied by daughter. Patient is being followed by Dr. Polly Cobia at [facility] in [place] for history of endometrial cancer. Per Dr. Shelba Flake note on 09/12/2019, patient obtained a CT scan of abdomen/pelvis on 08/30/2019, which revealed suspicious new adenopathy in the periaortic region. PET scan on 09/06/2019 showed evidence of increased hypermetabolic activity in the left para-aortic retroperitoneal lymph node measuring 1.6 x 1.4 cm with a max SUV of 10.4 cm. Some mild uptake was also noted in the right axillary node with a max SUV of 3.4 , which was felt to be more consistent with reactive changes as opposed to malignant chances in the axillary node. No other metastatic disease was identified.  Patient has been treated here before for stage II invasive ductal carcinoma with DCIS of the left breast in 2017 and high-grade serous carcinoma of the uterus in 2019. Her history of radiation therapy is below. She was last seen here for follow-up on 03/19/2018 and was doing well at that time.  Of note, the patient presented to [facility] on [facility]02[facility]2019 for bilateral diagnostic mammography with tomography per routine annual surveillance status post left breast lumpectomy in 2017. Results showed stable left breast lumpectomy site without  mammographic evidence of malignancy in the bilateral breasts.  Patient continues to take Anastrozole under the care of Dr. Jana Hakim. She is tolerating treatment well.  PREVIOUS RADIATION THERAPY: Yes   Radiation treatment dates:  01/09/2016 - 05/162017                                        Site/dose:   1) Left Breast / 40.05 Gy in 15 fractions 2) Left Breast Boost / 10 Gy in 5 fractions  Beams/energy:  1) tangents, 3D conformal  / 10 and 6MV 2)  photons / 10 and 6MV   Radiation treatment dates: 1. 07/14/2017 - 08/19/2017 2. 08/27/2017, 09/04/2017, 09/11/2017  Site/dose: 1. Thepelviswas treated to 45 Gy in 25 fractions of 1.8 Gy. 2. Brachytherapy Boost: The vaginal cuffwas treated 18 Gy in 3 fractions of 6 Gy.  Beams/energy: 1. IMRT // 6XFFF Photon 2. HDR Ir-192 Vaginal Brachytherapy  PAST MEDICAL HISTORY:  Past Medical History:  Diagnosis Date  . Breast cancer ([place])   . Breast cancer of lower-outer quadrant of left female breast ([place]) 11/02/2015  . Endometrial cancer ([place])   . History of brachytherapy 08/27/17-09/11/17   vaginal cuff 18 Gy in 3 fractions  . History of radiation therapy 07/14/2017-08/19/17   pelvis 45 Gy in 25 fractions  . Hx of radiation therapy 01/09/16- 02/06/16   Left Breast  . Personal history of chemotherapy 04/2017   for uterine ca  . Personal history of radiation therapy 2017   for breast ca  .  Personal history of radiation therapy 2018   for uterine ca    PAST SURGICAL HISTORY: Past Surgical History:  Procedure Laterality Date  . BREAST BIOPSY Left 11/06/2015   benign  . BREAST BIOPSY Left 10/31/2015   malignant  . BREAST BIOPSY Left 10/31/2015   benign  . BREAST BIOPSY Left 10/15/2013   benign  . BREAST LUMPECTOMY Left   . herniated disc    . RADIOACTIVE SEED GUIDED PARTIAL MASTECTOMY WITH AXILLARY SENTINEL LYMPH NODE BIOPSY Left 11/27/2015   Procedure: LEFT BREAST SEED AND WIRE GUIDED LUMPECTOMY WITH LEFT AXILLARY  LYMPH NODE BIOPSY;  Surgeon: Rolm Bookbinder, MD;  Location: [facility];  Service: General;  Laterality: Left;  . ROBOTIC ASSISTED TOTAL HYSTERECTOMY WITH BILATERAL SALPINGO OOPHERECTOMY  04/01/2017   ROBOTIC HYSTERECTOMY WITH BSO, SENTINEL NODE MAPPING AND D&C by Dr. Polly Cobia  . TONSILLECTOMY      FAMILY HISTORY:  Family History  Problem Relation Age of Onset  . Brain cancer Sister   . Ovarian cancer Maternal Aunt   . Breast cancer Other 40    SOCIAL HISTORY:  Social History   Tobacco Use  . Smoking status: Former Smoker    Types: Cigarettes    Quit date: 07/02/1982    Years since quitting: 37.2  . Smokeless tobacco: Never Used  Substance Use Topics  . Alcohol use: Yes    Alcohol/week: 3.0 standard drinks    Types: 3 Glasses of wine per week  . Drug use: No    ALLERGIES: No Known Allergies  MEDICATIONS:  Current Outpatient Medications  Medication Sig Dispense Refill  . acetaminophen (TYLENOL) 500 MG tablet Take 500 mg by mouth every 6 (six) hours as needed.    Marland Kitchen anastrozole (ARIMIDEX) 1 MG tablet Take 1 tablet (1 mg total) by mouth daily. 90 tablet 4  . Biotin 5000 MCG TABS Take 5,000 mcg by mouth daily.     . calcium carbonate (OS-CAL) 600 MG TABS tablet Take by mouth.    . cholecalciferol (VITAMIN D) 1000 units tablet Take 1,000 Units by mouth daily.    Marland Kitchen escitalopram (LEXAPRO) 20 MG tablet Take 20 mg by mouth daily.   11  . levothyroxine (SYNTHROID, LEVOTHROID) 88 MCG tablet TK 1 T PO QAM OES  11  . ondansetron (ZOFRAN) 4 MG tablet Take 1 tablet (4 mg total) by mouth every 8 (eight) hours as needed for nausea or vomiting. 20 tablet 1   No current facility-administered medications for this encounter.   Facility-Administered Medications Ordered in Other Encounters  Medication Dose Route Frequency Provider Last Rate Last Admin  . sodium chloride flush (NS) 0.9 % injection 10 mL  10 mL Intravenous PRN Gery Pray, MD   10 mL at 09/09/17 1351     REVIEW OF SYSTEMS:  A 10+ POINT REVIEW OF SYSTEMS WAS OBTAINED including neurology, dermatology, psychiatry, cardiac, respiratory, lymph, extremities, GI, GU, musculoskeletal, constitutional, reproductive, HEENT.  Denies any back pain.  Denies any flank pain.  Appetite is good.  She reports no weight loss.  She denies any vaginal bleeding   PHYSICAL EXAM:  weight is 161 lb 12.8 oz (73.4 kg). Her temperature is 97.9 F (36.6 C). Her blood pressure is 149/90 (abnormal) and her pulse is 81. Her respiration is 24 (abnormal) and oxygen saturation is 98%.   General: Alert and oriented, in no acute distress HEENT: Head is normocephalic. Extraocular movements are intact. Oropharynx is clear. Neck: Neck is supple, no palpable cervical or supraclavicular  lymphadenopathy. Heart: Regular in rate and rhythm with no murmurs, rubs, or gallops. Chest: Clear to auscultation bilaterally, with no rhonchi, wheezes, or rales. Abdomen: Soft, nontender, nondistended, with no rigidity or guarding. Extremities: No cyanosis or edema. Lymphatics: see Neck Exam Skin: No concerning lesions. Musculoskeletal: symmetric strength and muscle tone throughout. Neurologic: Cranial nerves II through XII are grossly intact. No obvious focalities. Speech is fluent. Coordination is intact. Psychiatric: Judgment and insight are intact. Affect is appropriate.  On pelvic examination deferred.  She has periodic exams with Dr. Polly Cobia  ECOG = 1  0 - Asymptomatic (Fully active, able to carry on all predisease activities without restriction)  1 - Symptomatic but completely ambulatory (Restricted in physically strenuous activity but ambulatory and able to carry out work of a light or sedentary nature. For example, light housework, office work)  2 - Symptomatic, <50% in bed during the day (Ambulatory and capable of all self care but unable to carry out any work activities. Up and about more than 50% of waking hours)  3 -  Symptomatic, >50% in bed, but not bedbound (Capable of only limited self-care, confined to bed or chair 50% or more of waking hours)  4 - Bedbound (Completely disabled. Cannot carry on any self-care. Totally confined to bed or chair)  5 - Death   Eustace Pen MM, Creech RH, Tormey DC, et al. [phone]). "Toxicity and response criteria of the [facility]". [place] Oncol. 5 (6): 649-55  LABORATORY DATA:  Lab Results  Component Value Date   WBC 3.6 (L) 09/09/2017   HGB 11.5 (L) 09/09/2017   HCT 36.2 09/09/2017   MCV 102.5 (H) 09/09/2017   PLT 115 (L) 09/09/2017   NEUTROABS 1.9 09/09/2017   Lab Results  Component Value Date   NA 139 09/09/2017   K 4.3 09/09/2017   CL 106 02/23/2012   CO2 26 09/09/2017   GLUCOSE 93 09/09/2017   CREATININE 0.9 09/09/2017   CALCIUM 9.0 09/09/2017      RADIOGRAPHY: No results found.    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  She would be a good candidate for definitive course of radiation therapy directed to this area.  I discussed the general course of treatment side effects and potential toxicities of radiation therapy in this situation with the patient and her daughter.  She appears to understand and wishes to proceed with radiation treatment   PLAN: Patient will return tomorrow for CT planning with treatments to begin next week.  Anticipate approximately 5-1/2 weeks of radiation therapy.    ------------------------------------------------  Blair Promise, PhD, MD  This document serves as a record of services personally performed by Gery Pray, MD. It was created on his behalf by Clerance Lav, a trained medical scribe. The creation of this record is based on the scribe's personal observations and the provider's statements to them. This document has been checked and approved by the attending  provider.

## 2019-09-23 ENCOUNTER — Ambulatory Visit: Payer: Medicare HMO

## 2019-09-23 ENCOUNTER — Institutional Professional Consult (permissible substitution): Payer: Medicare HMO | Admitting: Radiation Oncology

## 2019-09-27 ENCOUNTER — Other Ambulatory Visit: Payer: Self-pay

## 2019-09-27 ENCOUNTER — Ambulatory Visit
Admission: RE | Admit: 2019-09-27 | Discharge: 2019-09-27 | Disposition: A | Discharge status: Home / Self Care | Payer: Medicare HMO | Source: Ambulatory Visit | Attending: Radiation Oncology | Admitting: Radiation Oncology

## 2019-09-27 ENCOUNTER — Ambulatory Visit: Payer: Medicare HMO

## 2019-09-27 ENCOUNTER — Encounter: Payer: Self-pay | Admitting: Radiation Oncology

## 2019-09-27 VITALS — BP 149/90 | HR 81 | Temp 97.9°F | Resp 24 | Wt 161.8 lb

## 2019-09-27 DIAGNOSIS — C541 Malignant neoplasm of endometrium: Secondary | ICD-10-CM | POA: Insufficient documentation

## 2019-09-27 DIAGNOSIS — Z853 Personal history of malignant neoplasm of breast: Secondary | ICD-10-CM | POA: Insufficient documentation

## 2019-09-27 DIAGNOSIS — Z9221 Personal history of antineoplastic chemotherapy: Secondary | ICD-10-CM | POA: Diagnosis not present

## 2019-09-27 DIAGNOSIS — Z170 Estrogen receptor positive status [ER+]: Secondary | ICD-10-CM | POA: Insufficient documentation

## 2019-09-27 DIAGNOSIS — Z87891 Personal history of nicotine dependence: Secondary | ICD-10-CM | POA: Insufficient documentation

## 2019-09-27 DIAGNOSIS — Z8542 Personal history of malignant neoplasm of other parts of uterus: Secondary | ICD-10-CM | POA: Diagnosis not present

## 2019-09-27 DIAGNOSIS — Z79899 Other long term (current) drug therapy: Secondary | ICD-10-CM | POA: Insufficient documentation

## 2019-09-27 DIAGNOSIS — C50512 Malignant neoplasm of lower-outer quadrant of left female breast: Secondary | ICD-10-CM | POA: Diagnosis not present

## 2019-09-27 DIAGNOSIS — Z923 Personal history of irradiation: Secondary | ICD-10-CM | POA: Insufficient documentation

## 2019-09-27 DIAGNOSIS — Z79811 Long term (current) use of aromatase inhibitors: Secondary | ICD-10-CM | POA: Diagnosis not present

## 2019-09-27 DIAGNOSIS — C772 Secondary and unspecified malignant neoplasm of intra-abdominal lymph nodes: Secondary | ICD-10-CM | POA: Diagnosis not present

## 2019-09-27 MED ORDER — ONDANSETRON HCL 4 MG PO TABS: 4.0000 mg | ORAL_TABLET | Freq: Three times a day (TID) | ORAL | 1 refills | Status: DC | PRN

## 2019-09-27 NOTE — Patient Instructions (Signed)
Coronavirus (COVID-19) Are you at risk?  Are you at risk for the Coronavirus (COVID-19)?  To be considered HIGH RISK for Coronavirus (COVID-19), you have to meet the following criteria:  . Traveled to [place], [place], [place], [place] or [place]; or in the [place] to [place], [place], [place], or [place]; and have fever, cough, and shortness of breath within the last 2 weeks of travel OR . Been in close contact with a person diagnosed with COVID-19 within the last 2 weeks and have fever, cough, and shortness of breath . IF YOU DO NOT MEET THESE CRITERIA, YOU ARE CONSIDERED LOW RISK FOR COVID-19.  What to do if you are HIGH RISK for COVID-19?  . If you are having a medical emergency, call 911. . Seek medical care right away. Before you go to a doctor's office, urgent care or emergency department, call ahead and tell them about your recent travel, contact with someone diagnosed with COVID-19, and your symptoms. You should receive instructions from your physician's office regarding next steps of care.  . When you arrive at healthcare provider, tell the healthcare staff immediately you have returned from visiting [place], [place], [place], [place] or [place]; or traveled in the [place] to [place], [place], [place], or [place]; in the last two weeks or you have been in close contact with a person diagnosed with COVID-19 in the last 2 weeks.   . Tell the health care staff about your symptoms: fever, cough and shortness of breath. . After you have been seen by a medical provider, you will be either: o Tested for (COVID-19) and discharged home on quarantine except to seek medical care if symptoms worsen, and asked to  - Stay home and avoid contact with others until you get your results (4-5 days)  - Avoid travel on public transportation if possible (such as bus, train, or airplane) or o Sent to the Emergency Department by EMS for evaluation, COVID-19 testing, and possible  admission depending on your condition and test results.  What to do if you are LOW RISK for COVID-19?  Reduce your risk of any infection by using the same precautions used for avoiding the common cold or flu:  . Wash your hands often with soap and warm water for at least 20 seconds.  If soap and water are not readily available, use an alcohol-based hand sanitizer with at least 60% alcohol.  . If coughing or sneezing, cover your mouth and nose by coughing or sneezing into the elbow areas of your shirt or coat, into a tissue or into your sleeve (not your hands). . Avoid shaking hands with others and consider head nods or verbal greetings only. . Avoid touching your eyes, nose, or mouth with unwashed hands.  . Avoid close contact with people who are sick. . Avoid places or events with large numbers of people in one location, like concerts or sporting events. . Carefully consider travel plans you have or are making. . If you are planning any travel outside or inside the [place], visit the CDC's Travelers' Health webpage for the latest health notices. . If you have some symptoms but not all symptoms, continue to monitor at home and seek medical attention if your symptoms worsen. . If you are having a medical emergency, call 911.   ADDITIONAL HEALTHCARE OPTIONS FOR PATIENTS  [facility] / e-Visit: [email or web address]         [facility]: [phone]  [facility]: [phone]                   [facility]: [phone]   

## 2019-09-27 NOTE — Progress Notes (Signed)
Histology and Location of Primary Cancer: Recurrent stage IIIC serous carcinoma of the uterus.    Location(s) of Symptomatic tumor(s): -Progressive disease in the left periauricular region noted on CT imaging in December 2020 confirmed on PET imaging   Past/Anticipated chemotherapy by medical oncology, if any: None at this time.   Per Dr. Polly Cobia 09/09/19:  Per her insurance however we have been obtaining every 3 month CT scans and her most recent CT scan obtained on August 30, 2019 was suspicious for new adenopathy in the periaortic region which had not previously been seen. To further evaluate, I recommended obtaining pet imaging and this was performed on September 06, 2019 and the findings from this PET scan did show evidence of increased hypermetabolic activity in the left para-aortic retroperitoneal lymph node measuring 1.6 x 1.4 cm with a max SUV of 10.4. She was also noted to have some mild uptake noted in a right axillary node with a max SUV of 3.4. This was felt to be more consistent with reactive changes as opposed to malignant changes in the axillary node. No other metastatic disease was identified.   Pain on a scale of 0-10 is: Pt denies c/o pain.   Ambulatory status? Walker? Wheelchair?: Pt ambulating with steady gait, without assistive device.  SAFETY ISSUES: Prior radiation? Radiation treatment dates:    1. 07/14/2017 - 08/19/2017 2. 08/27/2017, 09/04/2017, 09/11/2017  Site/dose:    1. The pelvis was treated to 45 Gy in 25 fractions of 1.8 Gy. 2. Brachytherapy Boost: The vaginal cuff was treated 18 Gy in 3 fractions of 6 Gy.  Beams/energy:    1. IMRT // 6XFFF Photon  2. HDR Ir-192 Vaginal Brachytherapy  TREATMENT DATES: 01-09-16 to  02-06-16                                         Site/dose:   1) Left Breast / 40.05 Gy in 15 fractions 2) Left Breast Boost / 10 Gy in 5 fractions  Beams/energy:  1) tangents, 3D conformal  / 10 and 6MV 2)  photons / 10 and  6MV    Pacemaker/ICD? No  Possible current pregnancy? No  Is the patient on methotrexate? No  Additional Complaints / other details:  Pt presents today for reconsult with Dr. Sondra Come for Radiation Oncology. Pt is accompanied by daughter.  BP (!) 149/90 (BP Location: Right Arm, Patient Position: Sitting, Cuff Size: Normal)   Pulse 81   Temp 97.9 F (36.6 C)   Resp (!) 24   Wt 161 lb 12.8 oz (73.4 kg)   SpO2 98%   BMI 25.72 kg/m   Wt Readings from Last 3 Encounters:  09/27/19 161 lb 12.8 oz (73.4 kg)  04/06/18 146 lb (66.2 kg)  03/19/18 143 lb 6.4 oz (65 kg)   Loma Sousa, RN BSN

## 2019-09-28 ENCOUNTER — Ambulatory Visit
Admission: RE | Admit: 2019-09-28 | Discharge: 2019-09-28 | Disposition: A | Discharge status: Home / Self Care | Payer: Medicare HMO | Source: Ambulatory Visit | Attending: Radiation Oncology | Admitting: Radiation Oncology

## 2019-09-28 ENCOUNTER — Other Ambulatory Visit: Payer: Self-pay

## 2019-09-28 DIAGNOSIS — K529 Noninfective gastroenteritis and colitis, unspecified: Secondary | ICD-10-CM | POA: Insufficient documentation

## 2019-09-28 DIAGNOSIS — Z79899 Other long term (current) drug therapy: Secondary | ICD-10-CM | POA: Diagnosis not present

## 2019-09-28 DIAGNOSIS — R1110 Vomiting, unspecified: Secondary | ICD-10-CM | POA: Diagnosis not present

## 2019-09-28 DIAGNOSIS — C772 Secondary and unspecified malignant neoplasm of intra-abdominal lymph nodes: Secondary | ICD-10-CM | POA: Diagnosis not present

## 2019-09-28 DIAGNOSIS — C541 Malignant neoplasm of endometrium: Secondary | ICD-10-CM | POA: Diagnosis not present

## 2019-09-28 DIAGNOSIS — Z510 Encounter for antineoplastic radiation therapy: Secondary | ICD-10-CM | POA: Diagnosis not present

## 2019-09-28 DIAGNOSIS — Z87891 Personal history of nicotine dependence: Secondary | ICD-10-CM | POA: Diagnosis not present

## 2019-09-28 NOTE — Progress Notes (Signed)
  Radiation Oncology         [phone] ________________________________  Name: Autumn Frazier MRN: [id number]  Date: 09/28/2019  DOB: Sep 30, 1940  SIMULATION AND TREATMENT PLANNING NOTE    ICD-10-CM   1. Endometrial cancer ([place])  C54.1     DIAGNOSIS:  Recurrent stage IIIC serous carcinoma of the uterus with an isolated recurrence in the left periaortic region  NARRATIVE:  The patient was brought to the [facility].  Identity was confirmed.  All relevant records and images related to the planned course of therapy were reviewed.  The patient freely provided informed written consent to proceed with treatment after reviewing the details related to the planned course of therapy. The consent form was witnessed and verified by the simulation staff.  Then, the patient was set-up in a stable reproducible  supine position for radiation therapy.  CT images were obtained.  Surface markings were placed.  The CT images were loaded into the planning software.  Then the target and avoidance structures were contoured.  Treatment planning then occurred.  The radiation prescription was entered and confirmed.  Then, I designed and supervised the construction of a total of 4 medically necessary complex treatment devices.  I have requested : Intensity Modulated Radiotherapy (IMRT) is medically necessary for this case for the following reason:  Small bowel and kidney sparing..  I have ordered:dose calc.  PLAN:  The patient will receive 56 Gy in 28 fractions.  -----------------------------------  Blair Promise, PhD, MD

## 2019-10-01 ENCOUNTER — Telehealth: Payer: Self-pay

## 2019-10-01 NOTE — Telephone Encounter (Signed)
Patient left a voicemail stating that her pharmacy ([facility] on [address]) had not received the Zofran prescription Dr. Sondra Come had written for her. Review pt's chart and saw that prescription had actually been sent to [facility]. Returned patient's call and updated her. She stated it was no problem picking the prescription up at [facility], but that she would prefer [facility] to be listed as her preferred pharmacy. Informed patient I would update her chart. No other needs or concerns identified at this time.

## 2019-10-05 DIAGNOSIS — C772 Secondary and unspecified malignant neoplasm of intra-abdominal lymph nodes: Secondary | ICD-10-CM | POA: Diagnosis not present

## 2019-10-07 ENCOUNTER — Ambulatory Visit
Admission: RE | Admit: 2019-10-07 | Discharge: 2019-10-07 | Disposition: A | Discharge status: Home / Self Care | Payer: Medicare HMO | Source: Ambulatory Visit | Attending: Radiation Oncology | Admitting: Radiation Oncology

## 2019-10-07 ENCOUNTER — Other Ambulatory Visit: Payer: Self-pay

## 2019-10-07 DIAGNOSIS — C772 Secondary and unspecified malignant neoplasm of intra-abdominal lymph nodes: Secondary | ICD-10-CM | POA: Diagnosis not present

## 2019-10-07 DIAGNOSIS — C541 Malignant neoplasm of endometrium: Secondary | ICD-10-CM

## 2019-10-07 NOTE — Progress Notes (Signed)
  Radiation Oncology         [phone] ________________________________  Name: Autumn Frazier MRN: [id number]  Date: 10/07/2019  DOB: 1941-06-21  Simulation Verification Note    ICD-10-CM   1. Endometrial cancer ([place])  C54.1     Status: outpatient  NARRATIVE: The patient was brought to the treatment unit and placed in the planned treatment position. The clinical setup was verified. Then port films were obtained and uploaded to the radiation oncology medical record software.  The treatment beams were carefully compared against the planned radiation fields. The position location and shape of the radiation fields was reviewed. They targeted volume of tissue appears to be appropriately covered by the radiation beams. Organs at risk appear to be excluded as planned.  Based on my personal review, I approved the simulation verification. The patient's treatment will proceed as planned.  -----------------------------------  Blair Promise, PhD, MD

## 2019-10-08 ENCOUNTER — Emergency Department (HOSPITAL_COMMUNITY): Payer: Medicare HMO

## 2019-10-08 ENCOUNTER — Encounter (HOSPITAL_COMMUNITY): Payer: Self-pay

## 2019-10-08 ENCOUNTER — Emergency Department (HOSPITAL_COMMUNITY)
Admission: EM | Admit: 2019-10-08 | Discharge: 2019-10-09 | Disposition: A | Discharge status: Home / Self Care | Payer: Medicare HMO | Source: Home / Self Care | Attending: Emergency Medicine | Admitting: Emergency Medicine

## 2019-10-08 ENCOUNTER — Ambulatory Visit
Admission: RE | Admit: 2019-10-08 | Discharge: 2019-10-08 | Disposition: A | Discharge status: Home / Self Care | Payer: Medicare HMO | Source: Ambulatory Visit | Attending: Radiation Oncology | Admitting: Radiation Oncology

## 2019-10-08 ENCOUNTER — Other Ambulatory Visit: Payer: Self-pay

## 2019-10-08 DIAGNOSIS — W19XXXA Unspecified fall, initial encounter: Secondary | ICD-10-CM | POA: Diagnosis not present

## 2019-10-08 DIAGNOSIS — R52 Pain, unspecified: Secondary | ICD-10-CM | POA: Diagnosis not present

## 2019-10-08 DIAGNOSIS — C541 Malignant neoplasm of endometrium: Secondary | ICD-10-CM | POA: Insufficient documentation

## 2019-10-08 DIAGNOSIS — Z87891 Personal history of nicotine dependence: Secondary | ICD-10-CM | POA: Insufficient documentation

## 2019-10-08 DIAGNOSIS — K529 Noninfective gastroenteritis and colitis, unspecified: Secondary | ICD-10-CM | POA: Insufficient documentation

## 2019-10-08 DIAGNOSIS — O479 False labor, unspecified: Secondary | ICD-10-CM | POA: Diagnosis not present

## 2019-10-08 DIAGNOSIS — Z79899 Other long term (current) drug therapy: Secondary | ICD-10-CM | POA: Insufficient documentation

## 2019-10-08 DIAGNOSIS — R1084 Generalized abdominal pain: Secondary | ICD-10-CM

## 2019-10-08 DIAGNOSIS — C772 Secondary and unspecified malignant neoplasm of intra-abdominal lymph nodes: Secondary | ICD-10-CM | POA: Diagnosis not present

## 2019-10-08 DIAGNOSIS — R1110 Vomiting, unspecified: Secondary | ICD-10-CM | POA: Diagnosis not present

## 2019-10-08 LAB — CBC
HCT: 38.1 % (ref 36.0–46.0)
Hemoglobin: 12.5 g/dL (ref 12.0–15.0)
MCH: 32.4 pg (ref 26.0–34.0)
MCHC: 32.8 g/dL (ref 30.0–36.0)
MCV: 98.7 fL (ref 80.0–100.0)
Platelets: 189 10*3/uL (ref 150–400)
RBC: 3.86 MIL/uL — ABNORMAL LOW (ref 3.87–5.11)
RDW: 13.7 % (ref 11.5–15.5)
WBC: 6.6 10*3/uL (ref 4.0–10.5)
nRBC: 0 % (ref 0.0–0.2)

## 2019-10-08 LAB — COMPREHENSIVE METABOLIC PANEL
ALT: 10 U/L (ref 0–44)
AST: 14 U/L — ABNORMAL LOW (ref 15–41)
Albumin: 4 g/dL (ref 3.5–5.0)
Alkaline Phosphatase: 67 U/L (ref 38–126)
Anion gap: 12 (ref 5–15)
BUN: 26 mg/dL — ABNORMAL HIGH (ref 8–23)
CO2: 24 mmol/L (ref 22–32)
Calcium: 9.4 mg/dL (ref 8.9–10.3)
Chloride: 103 mmol/L (ref 98–111)
Creatinine, Ser: 0.98 mg/dL (ref 0.44–1.00)
GFR calc Af Amer: >60 mL/min (ref >60)
GFR calc non Af Amer: 54 mL/min — ABNORMAL LOW (ref >60)
Glucose, Bld: 119 mg/dL — ABNORMAL HIGH (ref 70–99)
Potassium: 4.3 mmol/L (ref 3.5–5.1)
Sodium: 139 mmol/L (ref 135–145)
Total Bilirubin: 0.6 mg/dL (ref 0.3–1.2)
Total Protein: 6.7 g/dL (ref 6.5–8.1)

## 2019-10-08 LAB — LIPASE, BLOOD: Lipase: 20 U/L (ref 11–51)

## 2019-10-08 MED ORDER — FENTANYL CITRATE (PF) 100 MCG/2ML IJ SOLN
50.0000 ug | Freq: Once | INTRAMUSCULAR | Status: AC
  Administered 2019-10-08: 22:00:00 50 ug via INTRAVENOUS
  Filled 2019-10-08: qty 2

## 2019-10-08 MED ORDER — SODIUM CHLORIDE (PF) 0.9 % IJ SOLN
INTRAMUSCULAR | Status: AC
  Filled 2019-10-08: qty 50

## 2019-10-08 MED ORDER — IOHEXOL 300 MG/ML  SOLN
100.0000 mL | Freq: Once | INTRAMUSCULAR | Status: AC | PRN
  Administered 2019-10-08: 100 mL via INTRAVENOUS

## 2019-10-08 MED ORDER — ONDANSETRON HCL 4 MG/2ML IJ SOLN
4.0000 mg | Freq: Once | INTRAMUSCULAR | Status: AC
Start: 2019-10-08 — End: 2019-10-08
  Administered 2019-10-08: 4 mg via INTRAVENOUS
  Filled 2019-10-08: qty 2

## 2019-10-08 NOTE — ED Notes (Signed)
Pt transported to CT ?

## 2019-10-08 NOTE — ED Triage Notes (Signed)
BIB EMS from home.  Hx of endometrial cancer. First radiation reatment yesterday and second today at 1600. Adominal pain stated at 1400 today. 1 episode of vomiting earlier in the today and denies N/V since the first episode of vomitng. Pain in ULQ does not radiate. Pain comes in waves every  5 to7-minutes and last for only about 10 seconds.

## 2019-10-08 NOTE — ED Notes (Signed)
Daughter phone number: Maudie Mercury: [phone]

## 2019-10-08 NOTE — ED Provider Notes (Signed)
[facility] DEPT Provider Note   CSN: [id number] Arrival date & time: 10/08/19  2043     History Chief Complaint  Patient presents with  . Abdominal Pain    Autumn Frazier is a 79 y.o. female.  She has a history of endometrial cancer and underwent chemotherapy in the past.  She said she was doing well until this past December when her oncologist found a spot in a lymph node.  She started radiation yesterday and had a repeat dose again today.  Since about 2 PM today she is getting colicky subxiphoid abdominal pain lasts about 10 seconds at a time and happens every 5 minutes or so.  Is been associated with nausea and one episode of vomiting.  Normal bowel movements.  Denies any chest pain shortness of breath fever cough.  No urinary symptoms.  The history is provided by the patient.  Abdominal Pain Pain location:  Epigastric Pain quality: cramping   Pain radiates to:  Does not radiate Pain severity:  Moderate Onset quality:  Sudden Duration:  8 hours Timing:  Intermittent Progression:  Unchanged Chronicity:  New Context: not recent travel, not suspicious food intake and not trauma   Relieved by:  Nothing Worsened by:  Nothing Ineffective treatments:  None tried Associated symptoms: nausea and vomiting   Associated symptoms: no chest pain, no chills, no constipation, no cough, no diarrhea, no dysuria, no fever, no hematemesis, no hematochezia, no hematuria, no melena, no shortness of breath, no sore throat and no vaginal bleeding        Past Medical History:  Diagnosis Date  . Breast cancer ([place])   . Breast cancer of lower-outer quadrant of left female breast ([place]) 11/02/2015  . Endometrial cancer ([place])   . History of brachytherapy 08/27/17-09/11/17   vaginal cuff 18 Gy in 3 fractions  . History of radiation therapy 07/14/2017-08/19/17   pelvis 45 Gy in 25 fractions  . Hx of radiation therapy 01/09/16- 02/06/16   Left Breast  . Personal history of  chemotherapy 04/2017   for uterine ca  . Personal history of radiation therapy 2017   for breast ca  . Personal history of radiation therapy 2018   for uterine ca    Patient Active Problem List   Diagnosis Date Noted  . Other intervertebral disc degeneration, lumbar region 11/17/2017  . Endometrial cancer ([place]) 07/02/2017  . Osteoporosis 03/18/2016  . Genetic testing 12/08/2015  . Malignant neoplasm of lower-outer quadrant of left breast of female, estrogen receptor positive ([place]) 11/02/2015    Past Surgical History:  Procedure Laterality Date  . BREAST BIOPSY Left 11/06/2015   benign  . BREAST BIOPSY Left 10/31/2015   malignant  . BREAST BIOPSY Left 10/31/2015   benign  . BREAST BIOPSY Left 10/15/2013   benign  . BREAST LUMPECTOMY Left   . herniated disc    . RADIOACTIVE SEED GUIDED PARTIAL MASTECTOMY WITH AXILLARY SENTINEL LYMPH NODE BIOPSY Left 11/27/2015   Procedure: LEFT BREAST SEED AND WIRE GUIDED LUMPECTOMY WITH LEFT AXILLARY LYMPH NODE BIOPSY;  Surgeon: Rolm Bookbinder, MD;  Location: [facility];  Service: General;  Laterality: Left;  . ROBOTIC ASSISTED TOTAL HYSTERECTOMY WITH BILATERAL SALPINGO OOPHERECTOMY  04/01/2017   ROBOTIC HYSTERECTOMY WITH BSO, SENTINEL NODE MAPPING AND D&C by Dr. Polly Cobia  . TONSILLECTOMY       OB History   No obstetric history on file.     Family History  Problem Relation Age of Onset  .  Brain cancer Sister   . Ovarian cancer Maternal Aunt   . Breast cancer Other 40    Social History   Tobacco Use  . Smoking status: Former Smoker    Types: Cigarettes    Quit date: 07/02/1982    Years since quitting: 37.2  . Smokeless tobacco: Never Used  Substance Use Topics  . Alcohol use: Yes    Alcohol/week: 3.0 standard drinks    Types: 3 Glasses of wine per week  . Drug use: No    Home Medications Prior to Admission medications   Medication Sig Start Date End Date Taking? Authorizing Provider  acetaminophen  (TYLENOL) 500 MG tablet Take 500 mg by mouth every 6 (six) hours as needed.    [provider]  anastrozole (ARIMIDEX) 1 MG tablet Take 1 tablet (1 mg total) by mouth daily. 03/01/19   Magrinat, Virgie Dad, MD  Biotin 5000 MCG TABS Take 5,000 mcg by mouth daily.     [provider]  calcium carbonate (OS-CAL) 600 MG TABS tablet Take by mouth.    [provider]  cholecalciferol (VITAMIN D) 1000 units tablet Take 1,000 Units by mouth daily.    [provider]  escitalopram (LEXAPRO) 20 MG tablet Take 20 mg by mouth daily.  11/07/17   [provider]  levothyroxine (SYNTHROID, LEVOTHROID) 88 MCG tablet TK 1 T PO QAM OES 01/15/18   [provider]  ondansetron (ZOFRAN) 4 MG tablet Take 1 tablet (4 mg total) by mouth every 8 (eight) hours as needed for nausea or vomiting. 09/27/19   Gery Pray, MD    Allergies    Patient has no known allergies.  Review of Systems   Review of Systems  Constitutional: Negative for chills and fever.  HENT: Negative for sore throat.   Eyes: Negative for visual disturbance.  Respiratory: Negative for cough and shortness of breath.   Cardiovascular: Negative for chest pain.  Gastrointestinal: Positive for abdominal pain, nausea and vomiting. Negative for constipation, diarrhea, hematemesis, hematochezia and melena.  Genitourinary: Negative for dysuria, hematuria and vaginal bleeding.  Musculoskeletal: Negative for back pain.  Skin: Negative for rash.  Neurological: Negative for headaches.    Physical Exam Updated Vital Signs BP (!) 150/78   Pulse 63   Temp 98.4 F (36.9 C) (Oral)   Resp 11   SpO2 99%   Physical Exam Vitals and nursing note reviewed.  Constitutional:      General: She is not in acute distress.    Appearance: She is well-developed.  HENT:     Head: Normocephalic and atraumatic.  Eyes:     Conjunctiva/sclera: Conjunctivae normal.  Cardiovascular:     Rate and Rhythm: Normal rate and  regular rhythm.     Heart sounds: No murmur.  Pulmonary:     Effort: Pulmonary effort is normal. No respiratory distress.     Breath sounds: Normal breath sounds.  Abdominal:     Palpations: Abdomen is soft. There is no mass.     Tenderness: There is abdominal tenderness. There is no guarding or rebound.     Hernia: No hernia is present.  Musculoskeletal:        General: No deformity or signs of injury. Normal range of motion.     Cervical back: Neck supple.  Skin:    General: Skin is warm and dry.     Capillary Refill: Capillary refill takes less than 2 seconds.  Neurological:     General: No focal deficit  present.     Mental Status: She is alert.     ED Results / Procedures / Treatments   Labs (all labs ordered are listed, but only abnormal results are displayed) Labs Reviewed  COMPREHENSIVE METABOLIC PANEL - Abnormal; Notable for the following components:      Result Value   Glucose, Bld 119 (*)    BUN 26 (*)    AST 14 (*)    GFR calc non Af Amer 54 (*)    All other components within normal limits  CBC - Abnormal; Notable for the following components:   RBC 3.86 (*)    All other components within normal limits  LIPASE, BLOOD    EKG None  Radiology CT Abdomen Pelvis W Contrast  Result Date: 10/08/2019 CLINICAL DATA:  History of endometrial cancer with recent radiation therapy and abdominal pain and vomiting EXAM: CT ABDOMEN AND PELVIS WITH CONTRAST TECHNIQUE: Multidetector CT imaging of the abdomen and pelvis was performed using the standard protocol following bolus administration of intravenous contrast. CONTRAST:  181mL OMNIPAQUE IOHEXOL 300 MG/ML  SOLN COMPARISON:  11/25/2017 CT, 11/29/2017 MRI FINDINGS: Lower chest: No acute abnormality. Sliding-type hiatal hernia is noted. Hepatobiliary: Gallbladder is within normal limits. The liver demonstrates scattered hypodensities consistent with small cysts. No focal mass is noted. Pancreas: Unremarkable. No pancreatic ductal  dilatation or surrounding inflammatory changes. Spleen: Scattered calcifications are noted consistent with granulomas. Adrenals/Urinary Tract: Adrenal glands are within normal limits. Scattered bilateral renal cysts are seen. The largest of these is noted posteriorly in the right kidney measuring 4.7 cm in greatest dimension. No renal calculi or obstructive changes are seen. The bladder is decompressed. Stomach/Bowel: Scattered diverticular change of the colon is noted without evidence of diverticulitis. The appendix is within normal limits. Multiple loops of dilated jejunum and proximal ileum are identified. No definitive transition zone is seen although a mild caliber change is noted in the mid ileum without focal mass or changes suggestive of adhesions. These changes may represent a mild ileus or enteritis. No true obstructing lesion is noted. Vascular/Lymphatic: Aortic atherosclerosis. No enlarged abdominal or pelvic lymph nodes. Reproductive: Uterus has been surgically removed. Other: No free fluid is noted. Fat containing left inguinal hernia is noted. Musculoskeletal: Degenerative changes of lumbar spine are noted. Changes of prior sacral fractures are again seen. Fracture through the pubic symphysis on the right is noted. No acute bony abnormality is seen. IMPRESSION: Dilated loops of jejunum and ileum without definitive obstructing lesion. This may represent enteritis for possible ileus. Chronic changes as described above Electronically Signed   By: Inez Catalina M.D.   On: 10/08/2019 23:26    Procedures Procedures (including critical care time)  Medications Ordered in ED Medications  ondansetron (ZOFRAN) injection 4 mg (4 mg Intravenous Given 10/08/19 2221)  fentaNYL (SUBLIMAZE) injection 50 mcg (50 mcg Intravenous Given 10/08/19 2224)  iohexol (OMNIPAQUE) 300 MG/ML solution 100 mL (100 mLs Intravenous Contrast Given 10/08/19 2255)  heparin lock flush 100 unit/mL (500 Units Intracatheter Given  10/09/19 0136)    ED Course  I have reviewed the triage vital signs and the nursing notes.  Pertinent labs & imaging results that were available during my care of the patient were reviewed by me and considered in my medical decision making (see chart for details).  Clinical Course as of Oct 08 1013  Fri Oct 08, 2619  2626 79 year old female with colicky subxiphoid abdominal pain going on since 2 PM today in the setting of recently starting  radiation for possible uterine cancer recurrence.  Differential includes cholelithiasis, cholecystitis, peptic ulcer disease, renal colic   [MB]  [id number] Reviewed the patient's results with her including her CAT scan results.  She asked if I could talk to her daughter about this too.  I explained that the diagnosis of small bowel thickening is nonspecific.  I said we could admit her to the hospital or send her home with close follow-up.   [MB]    Clinical Course User Index [MB] Hayden Rasmussen, MD   MDM Rules/Calculators/A&P                       Final Clinical Impression(s) / ED Diagnoses Final diagnoses:  Generalized abdominal pain  Enteritis    Rx / DC Orders ED Discharge Orders         Ordered    HYDROcodone-acetaminophen (NORCO/VICODIN) 5-325 MG tablet  Every 6 hours PRN     10/09/19 0041           Hayden Rasmussen, MD 10/09/19 1017

## 2019-10-09 MED ORDER — HYDROCODONE-ACETAMINOPHEN 5-325 MG PO TABS: 1.0000 | ORAL_TABLET | Freq: Four times a day (QID) | ORAL | 0 refills | Status: DC | PRN

## 2019-10-09 MED ORDER — HEPARIN SOD (PORK) LOCK FLUSH 100 UNIT/ML IV SOLN
500.0000 [IU] | Freq: Once | INTRAVENOUS | Status: AC
  Administered 2019-10-09: 500 [IU]
  Filled 2019-10-09: qty 5

## 2019-10-09 NOTE — Discharge Instructions (Addendum)
You were seen in the emergency department for evaluation of abdominal pain.  Your lab work was unremarkable, but your CAT scan showed some dilation of your small bowel. We discussed admission vs. Home with followup and you wanted to go home.  Please start with a clear liquid diet and advance as tolerated.  We are prescribing a short course of some pain medicine to use as needed.  If your symptoms worsen or you experience a fever please return to the emergency department for further evaluation.

## 2019-10-11 ENCOUNTER — Other Ambulatory Visit: Payer: Self-pay

## 2019-10-11 ENCOUNTER — Ambulatory Visit
Admission: RE | Admit: 2019-10-11 | Discharge: 2019-10-11 | Disposition: A | Discharge status: Home / Self Care | Payer: Medicare HMO | Source: Ambulatory Visit | Attending: Radiation Oncology | Admitting: Radiation Oncology

## 2019-10-12 ENCOUNTER — Other Ambulatory Visit: Payer: Self-pay

## 2019-10-12 ENCOUNTER — Ambulatory Visit
Admission: RE | Admit: 2019-10-12 | Discharge: 2019-10-12 | Disposition: A | Discharge status: Home / Self Care | Payer: Medicare HMO | Source: Ambulatory Visit | Attending: Radiation Oncology | Admitting: Radiation Oncology

## 2019-10-12 DIAGNOSIS — C772 Secondary and unspecified malignant neoplasm of intra-abdominal lymph nodes: Secondary | ICD-10-CM | POA: Diagnosis not present

## 2019-10-13 ENCOUNTER — Other Ambulatory Visit: Payer: Self-pay

## 2019-10-13 ENCOUNTER — Ambulatory Visit
Admission: RE | Admit: 2019-10-13 | Discharge: 2019-10-13 | Disposition: A | Discharge status: Home / Self Care | Payer: Medicare HMO | Source: Ambulatory Visit | Attending: Radiation Oncology | Admitting: Radiation Oncology

## 2019-10-13 DIAGNOSIS — C541 Malignant neoplasm of endometrium: Secondary | ICD-10-CM | POA: Diagnosis not present

## 2019-10-13 DIAGNOSIS — C772 Secondary and unspecified malignant neoplasm of intra-abdominal lymph nodes: Secondary | ICD-10-CM | POA: Diagnosis not present

## 2019-10-13 DIAGNOSIS — Z452 Encounter for adjustment and management of vascular access device: Secondary | ICD-10-CM | POA: Diagnosis not present

## 2019-10-14 ENCOUNTER — Ambulatory Visit
Admission: RE | Admit: 2019-10-14 | Discharge: 2019-10-14 | Disposition: A | Discharge status: Home / Self Care | Payer: Medicare HMO | Source: Ambulatory Visit | Attending: Radiation Oncology | Admitting: Radiation Oncology

## 2019-10-14 ENCOUNTER — Other Ambulatory Visit: Payer: Self-pay

## 2019-10-14 DIAGNOSIS — C772 Secondary and unspecified malignant neoplasm of intra-abdominal lymph nodes: Secondary | ICD-10-CM | POA: Diagnosis not present

## 2019-10-15 ENCOUNTER — Ambulatory Visit
Admission: RE | Admit: 2019-10-15 | Discharge: 2019-10-15 | Disposition: A | Discharge status: Home / Self Care | Payer: Medicare HMO | Source: Ambulatory Visit | Attending: Radiation Oncology | Admitting: Radiation Oncology

## 2019-10-15 ENCOUNTER — Other Ambulatory Visit: Payer: Self-pay

## 2019-10-15 DIAGNOSIS — C772 Secondary and unspecified malignant neoplasm of intra-abdominal lymph nodes: Secondary | ICD-10-CM | POA: Diagnosis not present

## 2019-10-18 ENCOUNTER — Other Ambulatory Visit: Payer: Self-pay

## 2019-10-18 ENCOUNTER — Ambulatory Visit
Admission: RE | Admit: 2019-10-18 | Discharge: 2019-10-18 | Disposition: A | Discharge status: Home / Self Care | Payer: Medicare HMO | Source: Ambulatory Visit | Attending: Radiation Oncology | Admitting: Radiation Oncology

## 2019-10-19 ENCOUNTER — Other Ambulatory Visit: Payer: Self-pay

## 2019-10-19 ENCOUNTER — Ambulatory Visit
Admission: RE | Admit: 2019-10-19 | Discharge: 2019-10-19 | Disposition: A | Discharge status: Home / Self Care | Payer: Medicare HMO | Source: Ambulatory Visit | Attending: Radiation Oncology | Admitting: Radiation Oncology

## 2019-10-19 DIAGNOSIS — C772 Secondary and unspecified malignant neoplasm of intra-abdominal lymph nodes: Secondary | ICD-10-CM | POA: Diagnosis not present

## 2019-10-20 ENCOUNTER — Ambulatory Visit
Admission: RE | Admit: 2019-10-20 | Discharge: 2019-10-20 | Disposition: A | Discharge status: Home / Self Care | Payer: Medicare HMO | Source: Ambulatory Visit | Attending: Radiation Oncology | Admitting: Radiation Oncology

## 2019-10-20 ENCOUNTER — Other Ambulatory Visit: Payer: Self-pay

## 2019-10-20 ENCOUNTER — Ambulatory Visit: Payer: Medicare HMO

## 2019-10-20 DIAGNOSIS — C772 Secondary and unspecified malignant neoplasm of intra-abdominal lymph nodes: Secondary | ICD-10-CM | POA: Diagnosis not present

## 2019-10-21 ENCOUNTER — Other Ambulatory Visit: Payer: Self-pay

## 2019-10-21 ENCOUNTER — Ambulatory Visit
Admission: RE | Admit: 2019-10-21 | Discharge: 2019-10-21 | Disposition: A | Discharge status: Home / Self Care | Payer: Medicare HMO | Source: Ambulatory Visit | Attending: Radiation Oncology | Admitting: Radiation Oncology

## 2019-10-21 DIAGNOSIS — C772 Secondary and unspecified malignant neoplasm of intra-abdominal lymph nodes: Secondary | ICD-10-CM | POA: Diagnosis not present

## 2019-10-22 ENCOUNTER — Other Ambulatory Visit: Payer: Self-pay

## 2019-10-22 ENCOUNTER — Ambulatory Visit
Admission: RE | Admit: 2019-10-22 | Discharge: 2019-10-22 | Disposition: A | Discharge status: Home / Self Care | Payer: Medicare HMO | Source: Ambulatory Visit | Attending: Radiation Oncology | Admitting: Radiation Oncology

## 2019-10-22 DIAGNOSIS — C772 Secondary and unspecified malignant neoplasm of intra-abdominal lymph nodes: Secondary | ICD-10-CM | POA: Diagnosis not present

## 2019-10-25 ENCOUNTER — Other Ambulatory Visit: Payer: Self-pay

## 2019-10-25 ENCOUNTER — Ambulatory Visit
Admission: RE | Admit: 2019-10-25 | Discharge: 2019-10-25 | Disposition: A | Discharge status: Home / Self Care | Payer: Medicare HMO | Source: Ambulatory Visit | Attending: Radiation Oncology | Admitting: Radiation Oncology

## 2019-10-25 DIAGNOSIS — C541 Malignant neoplasm of endometrium: Secondary | ICD-10-CM | POA: Insufficient documentation

## 2019-10-25 DIAGNOSIS — Z510 Encounter for antineoplastic radiation therapy: Secondary | ICD-10-CM | POA: Insufficient documentation

## 2019-10-25 DIAGNOSIS — C772 Secondary and unspecified malignant neoplasm of intra-abdominal lymph nodes: Secondary | ICD-10-CM | POA: Diagnosis not present

## 2019-10-26 ENCOUNTER — Ambulatory Visit
Admission: RE | Admit: 2019-10-26 | Discharge: 2019-10-26 | Disposition: A | Discharge status: Home / Self Care | Payer: Medicare HMO | Source: Ambulatory Visit | Attending: Radiation Oncology | Admitting: Radiation Oncology

## 2019-10-26 DIAGNOSIS — Z510 Encounter for antineoplastic radiation therapy: Secondary | ICD-10-CM | POA: Diagnosis not present

## 2019-10-26 DIAGNOSIS — C541 Malignant neoplasm of endometrium: Secondary | ICD-10-CM | POA: Diagnosis not present

## 2019-10-26 DIAGNOSIS — C772 Secondary and unspecified malignant neoplasm of intra-abdominal lymph nodes: Secondary | ICD-10-CM | POA: Diagnosis not present

## 2019-10-27 ENCOUNTER — Other Ambulatory Visit: Payer: Self-pay

## 2019-10-27 ENCOUNTER — Ambulatory Visit
Admission: RE | Admit: 2019-10-27 | Discharge: 2019-10-27 | Disposition: A | Discharge status: Home / Self Care | Payer: Medicare HMO | Source: Ambulatory Visit | Attending: Radiation Oncology | Admitting: Radiation Oncology

## 2019-10-27 DIAGNOSIS — Z510 Encounter for antineoplastic radiation therapy: Secondary | ICD-10-CM | POA: Diagnosis not present

## 2019-10-27 DIAGNOSIS — C772 Secondary and unspecified malignant neoplasm of intra-abdominal lymph nodes: Secondary | ICD-10-CM | POA: Diagnosis not present

## 2019-10-27 DIAGNOSIS — C541 Malignant neoplasm of endometrium: Secondary | ICD-10-CM | POA: Diagnosis not present

## 2019-10-28 ENCOUNTER — Other Ambulatory Visit: Payer: Self-pay

## 2019-10-28 ENCOUNTER — Ambulatory Visit
Admission: RE | Admit: 2019-10-28 | Discharge: 2019-10-28 | Disposition: A | Discharge status: Home / Self Care | Payer: Medicare HMO | Source: Ambulatory Visit | Attending: Radiation Oncology | Admitting: Radiation Oncology

## 2019-10-28 DIAGNOSIS — Z510 Encounter for antineoplastic radiation therapy: Secondary | ICD-10-CM | POA: Diagnosis not present

## 2019-10-28 DIAGNOSIS — C541 Malignant neoplasm of endometrium: Secondary | ICD-10-CM | POA: Diagnosis not present

## 2019-10-28 DIAGNOSIS — C772 Secondary and unspecified malignant neoplasm of intra-abdominal lymph nodes: Secondary | ICD-10-CM | POA: Diagnosis not present

## 2019-10-29 ENCOUNTER — Ambulatory Visit: Payer: Medicare HMO

## 2019-10-29 ENCOUNTER — Ambulatory Visit
Admission: RE | Admit: 2019-10-29 | Discharge: 2019-10-29 | Disposition: A | Discharge status: Home / Self Care | Payer: Medicare HMO | Source: Ambulatory Visit | Attending: Radiation Oncology | Admitting: Radiation Oncology

## 2019-10-29 DIAGNOSIS — Z510 Encounter for antineoplastic radiation therapy: Secondary | ICD-10-CM | POA: Diagnosis not present

## 2019-10-29 DIAGNOSIS — C772 Secondary and unspecified malignant neoplasm of intra-abdominal lymph nodes: Secondary | ICD-10-CM | POA: Diagnosis not present

## 2019-10-29 DIAGNOSIS — C541 Malignant neoplasm of endometrium: Secondary | ICD-10-CM | POA: Diagnosis not present

## 2019-10-31 ENCOUNTER — Ambulatory Visit: Payer: Medicare HMO

## 2019-11-01 ENCOUNTER — Other Ambulatory Visit: Payer: Self-pay

## 2019-11-01 ENCOUNTER — Ambulatory Visit
Admission: RE | Admit: 2019-11-01 | Discharge: 2019-11-01 | Disposition: A | Discharge status: Home / Self Care | Payer: Medicare HMO | Source: Ambulatory Visit | Attending: Radiation Oncology | Admitting: Radiation Oncology

## 2019-11-01 DIAGNOSIS — Z510 Encounter for antineoplastic radiation therapy: Secondary | ICD-10-CM | POA: Diagnosis not present

## 2019-11-01 DIAGNOSIS — C772 Secondary and unspecified malignant neoplasm of intra-abdominal lymph nodes: Secondary | ICD-10-CM | POA: Diagnosis not present

## 2019-11-01 DIAGNOSIS — C541 Malignant neoplasm of endometrium: Secondary | ICD-10-CM | POA: Diagnosis not present

## 2019-11-02 ENCOUNTER — Ambulatory Visit
Admission: RE | Admit: 2019-11-02 | Discharge: 2019-11-02 | Disposition: A | Discharge status: Home / Self Care | Payer: Medicare HMO | Source: Ambulatory Visit | Attending: Radiation Oncology | Admitting: Radiation Oncology

## 2019-11-02 DIAGNOSIS — C541 Malignant neoplasm of endometrium: Secondary | ICD-10-CM | POA: Diagnosis not present

## 2019-11-02 DIAGNOSIS — Z510 Encounter for antineoplastic radiation therapy: Secondary | ICD-10-CM | POA: Diagnosis not present

## 2019-11-02 DIAGNOSIS — C772 Secondary and unspecified malignant neoplasm of intra-abdominal lymph nodes: Secondary | ICD-10-CM | POA: Diagnosis not present

## 2019-11-03 ENCOUNTER — Ambulatory Visit
Admission: RE | Admit: 2019-11-03 | Discharge: 2019-11-03 | Disposition: A | Discharge status: Home / Self Care | Payer: Medicare HMO | Source: Ambulatory Visit | Attending: Radiation Oncology | Admitting: Radiation Oncology

## 2019-11-03 DIAGNOSIS — Z510 Encounter for antineoplastic radiation therapy: Secondary | ICD-10-CM | POA: Diagnosis not present

## 2019-11-03 DIAGNOSIS — C541 Malignant neoplasm of endometrium: Secondary | ICD-10-CM | POA: Diagnosis not present

## 2019-11-03 DIAGNOSIS — C772 Secondary and unspecified malignant neoplasm of intra-abdominal lymph nodes: Secondary | ICD-10-CM | POA: Diagnosis not present

## 2019-11-04 ENCOUNTER — Ambulatory Visit
Admission: RE | Admit: 2019-11-04 | Discharge: 2019-11-04 | Disposition: A | Discharge status: Home / Self Care | Payer: Medicare HMO | Source: Ambulatory Visit | Attending: Radiation Oncology | Admitting: Radiation Oncology

## 2019-11-04 DIAGNOSIS — Z510 Encounter for antineoplastic radiation therapy: Secondary | ICD-10-CM | POA: Diagnosis not present

## 2019-11-04 DIAGNOSIS — C772 Secondary and unspecified malignant neoplasm of intra-abdominal lymph nodes: Secondary | ICD-10-CM | POA: Diagnosis not present

## 2019-11-04 DIAGNOSIS — C541 Malignant neoplasm of endometrium: Secondary | ICD-10-CM | POA: Diagnosis not present

## 2019-11-05 ENCOUNTER — Other Ambulatory Visit: Payer: Self-pay

## 2019-11-05 ENCOUNTER — Ambulatory Visit
Admission: RE | Admit: 2019-11-05 | Discharge: 2019-11-05 | Disposition: A | Discharge status: Home / Self Care | Payer: Medicare HMO | Source: Ambulatory Visit | Attending: Radiation Oncology | Admitting: Radiation Oncology

## 2019-11-05 DIAGNOSIS — Z510 Encounter for antineoplastic radiation therapy: Secondary | ICD-10-CM | POA: Diagnosis not present

## 2019-11-05 DIAGNOSIS — C541 Malignant neoplasm of endometrium: Secondary | ICD-10-CM | POA: Diagnosis not present

## 2019-11-05 DIAGNOSIS — C772 Secondary and unspecified malignant neoplasm of intra-abdominal lymph nodes: Secondary | ICD-10-CM | POA: Diagnosis not present

## 2019-11-08 ENCOUNTER — Other Ambulatory Visit: Payer: Self-pay

## 2019-11-08 ENCOUNTER — Ambulatory Visit
Admission: RE | Admit: 2019-11-08 | Discharge: 2019-11-08 | Disposition: A | Discharge status: Home / Self Care | Payer: Medicare HMO | Source: Ambulatory Visit | Attending: Radiation Oncology | Admitting: Radiation Oncology

## 2019-11-08 DIAGNOSIS — C541 Malignant neoplasm of endometrium: Secondary | ICD-10-CM | POA: Diagnosis not present

## 2019-11-08 DIAGNOSIS — Z510 Encounter for antineoplastic radiation therapy: Secondary | ICD-10-CM | POA: Diagnosis not present

## 2019-11-08 DIAGNOSIS — C772 Secondary and unspecified malignant neoplasm of intra-abdominal lymph nodes: Secondary | ICD-10-CM | POA: Diagnosis not present

## 2019-11-09 ENCOUNTER — Ambulatory Visit
Admission: RE | Admit: 2019-11-09 | Discharge: 2019-11-09 | Disposition: A | Discharge status: Home / Self Care | Payer: Medicare HMO | Source: Ambulatory Visit | Attending: Radiation Oncology | Admitting: Radiation Oncology

## 2019-11-09 DIAGNOSIS — C541 Malignant neoplasm of endometrium: Secondary | ICD-10-CM | POA: Diagnosis not present

## 2019-11-09 DIAGNOSIS — C772 Secondary and unspecified malignant neoplasm of intra-abdominal lymph nodes: Secondary | ICD-10-CM | POA: Diagnosis not present

## 2019-11-09 DIAGNOSIS — Z510 Encounter for antineoplastic radiation therapy: Secondary | ICD-10-CM | POA: Diagnosis not present

## 2019-11-10 ENCOUNTER — Ambulatory Visit
Admission: RE | Admit: 2019-11-10 | Discharge: 2019-11-10 | Disposition: A | Discharge status: Home / Self Care | Payer: Medicare HMO | Source: Ambulatory Visit | Attending: Radiation Oncology | Admitting: Radiation Oncology

## 2019-11-10 ENCOUNTER — Other Ambulatory Visit: Payer: Self-pay

## 2019-11-10 DIAGNOSIS — C772 Secondary and unspecified malignant neoplasm of intra-abdominal lymph nodes: Secondary | ICD-10-CM | POA: Diagnosis not present

## 2019-11-10 DIAGNOSIS — C541 Malignant neoplasm of endometrium: Secondary | ICD-10-CM | POA: Diagnosis not present

## 2019-11-10 DIAGNOSIS — Z510 Encounter for antineoplastic radiation therapy: Secondary | ICD-10-CM | POA: Diagnosis not present

## 2019-11-11 ENCOUNTER — Ambulatory Visit: Payer: Medicare HMO

## 2019-11-12 ENCOUNTER — Ambulatory Visit
Admission: RE | Admit: 2019-11-12 | Discharge: 2019-11-12 | Disposition: A | Discharge status: Home / Self Care | Payer: Medicare HMO | Source: Ambulatory Visit | Attending: Radiation Oncology | Admitting: Radiation Oncology

## 2019-11-12 DIAGNOSIS — Z510 Encounter for antineoplastic radiation therapy: Secondary | ICD-10-CM | POA: Diagnosis not present

## 2019-11-12 DIAGNOSIS — C772 Secondary and unspecified malignant neoplasm of intra-abdominal lymph nodes: Secondary | ICD-10-CM | POA: Diagnosis not present

## 2019-11-12 DIAGNOSIS — C541 Malignant neoplasm of endometrium: Secondary | ICD-10-CM | POA: Diagnosis not present

## 2019-11-15 ENCOUNTER — Ambulatory Visit: Payer: Medicare HMO

## 2019-11-15 ENCOUNTER — Other Ambulatory Visit: Payer: Self-pay

## 2019-11-15 ENCOUNTER — Ambulatory Visit
Admission: RE | Admit: 2019-11-15 | Discharge: 2019-11-15 | Disposition: A | Discharge status: Home / Self Care | Payer: Medicare HMO | Source: Ambulatory Visit | Attending: Radiation Oncology | Admitting: Radiation Oncology

## 2019-11-15 DIAGNOSIS — C772 Secondary and unspecified malignant neoplasm of intra-abdominal lymph nodes: Secondary | ICD-10-CM | POA: Diagnosis not present

## 2019-11-15 DIAGNOSIS — C541 Malignant neoplasm of endometrium: Secondary | ICD-10-CM | POA: Diagnosis not present

## 2019-11-15 DIAGNOSIS — Z510 Encounter for antineoplastic radiation therapy: Secondary | ICD-10-CM | POA: Diagnosis not present

## 2019-11-16 ENCOUNTER — Ambulatory Visit: Payer: Medicare HMO | Admitting: Radiation Oncology

## 2019-11-16 ENCOUNTER — Encounter: Payer: Self-pay | Admitting: Radiation Oncology

## 2019-11-16 ENCOUNTER — Other Ambulatory Visit: Payer: Self-pay

## 2019-11-16 ENCOUNTER — Ambulatory Visit
Admission: RE | Admit: 2019-11-16 | Discharge: 2019-11-16 | Disposition: A | Discharge status: Home / Self Care | Payer: Medicare HMO | Source: Ambulatory Visit | Attending: Radiation Oncology | Admitting: Radiation Oncology

## 2019-11-16 DIAGNOSIS — Z510 Encounter for antineoplastic radiation therapy: Secondary | ICD-10-CM | POA: Diagnosis not present

## 2019-11-16 DIAGNOSIS — C541 Malignant neoplasm of endometrium: Secondary | ICD-10-CM | POA: Diagnosis not present

## 2019-11-16 DIAGNOSIS — C772 Secondary and unspecified malignant neoplasm of intra-abdominal lymph nodes: Secondary | ICD-10-CM | POA: Diagnosis not present

## 2019-11-22 NOTE — Progress Notes (Incomplete)
  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  Of note, the patient was seen in the ED on 10/08/2019 with chief complaint of abdominal pain. CT of abdomen/pelvis showed dilated loops of jejunum and ileum without definitive obstructing lesion. The patient was diagnosed with generalized abdominal pain and enteritis and was discharged home.  Throughout most of the treatment, the patient denied fatigue, pain, diarrhea, poor appetite, dysuria, hematuria, and skin changes. However, on 11/02/2019, she did report some mild fatigue and occasional nausea with a rare, fleeting "sensation" in her lower abdomen. From there forward, she denied fatigue but did report an occasional "wave of pain through her abdomen" and occasional nausea.  Plan: The patient will follow-up with radiation oncology in one month.  ________________________________________________   Blair Promise, PhD, MD  This document serves as a record of services personally performed by  Gery Pray, MD. It was created on his behalf by Clerance Lav, a trained medical scribe. The creation of this record is based on the scribe's personal observations and the provider's statements to them. This document has been checked and approved by the attending provider.

## 2019-12-08 DIAGNOSIS — C541 Malignant neoplasm of endometrium: Secondary | ICD-10-CM | POA: Diagnosis not present

## 2019-12-08 DIAGNOSIS — Z452 Encounter for adjustment and management of vascular access device: Secondary | ICD-10-CM | POA: Diagnosis not present

## 2019-12-20 ENCOUNTER — Ambulatory Visit
Admission: RE | Admit: 2019-12-20 | Discharge: 2019-12-20 | Disposition: A | Discharge status: Home / Self Care | Payer: Medicare HMO | Source: Ambulatory Visit | Attending: Radiation Oncology | Admitting: Radiation Oncology

## 2019-12-20 ENCOUNTER — Encounter: Payer: Self-pay | Admitting: Radiation Oncology

## 2019-12-20 ENCOUNTER — Other Ambulatory Visit: Payer: Self-pay

## 2019-12-20 VITALS — BP 127/83 | HR 67 | Temp 98.9°F | Resp 18 | Ht 66.0 in | Wt 163.4 lb

## 2019-12-20 DIAGNOSIS — Z79899 Other long term (current) drug therapy: Secondary | ICD-10-CM | POA: Diagnosis not present

## 2019-12-20 DIAGNOSIS — R5383 Other fatigue: Secondary | ICD-10-CM | POA: Insufficient documentation

## 2019-12-20 DIAGNOSIS — Z923 Personal history of irradiation: Secondary | ICD-10-CM | POA: Insufficient documentation

## 2019-12-20 DIAGNOSIS — C541 Malignant neoplasm of endometrium: Secondary | ICD-10-CM | POA: Insufficient documentation

## 2019-12-20 NOTE — Patient Instructions (Signed)
Coronavirus (COVID-19) Are you at risk?  Are you at risk for the Coronavirus (COVID-19)?  To be considered HIGH RISK for Coronavirus (COVID-19), you have to meet the following criteria:  . Traveled to [place], [place], [place], [place] or [place]; or in the [place] to [place], [place], [place], or [place]; and have fever, cough, and shortness of breath within the last 2 weeks of travel OR . Been in close contact with a person diagnosed with COVID-19 within the last 2 weeks and have fever, cough, and shortness of breath . IF YOU DO NOT MEET THESE CRITERIA, YOU ARE CONSIDERED LOW RISK FOR COVID-19.  What to do if you are HIGH RISK for COVID-19?  . If you are having a medical emergency, call 911. . Seek medical care right away. Before you go to a doctor's office, urgent care or emergency department, call ahead and tell them about your recent travel, contact with someone diagnosed with COVID-19, and your symptoms. You should receive instructions from your physician's office regarding next steps of care.  . When you arrive at healthcare provider, tell the healthcare staff immediately you have returned from visiting [place], [place], [place], [place] or [place]; or traveled in the [place] to [place], [place], [place], or [place]; in the last two weeks or you have been in close contact with a person diagnosed with COVID-19 in the last 2 weeks.   . Tell the health care staff about your symptoms: fever, cough and shortness of breath. . After you have been seen by a medical provider, you will be either: o Tested for (COVID-19) and discharged home on quarantine except to seek medical care if symptoms worsen, and asked to  - Stay home and avoid contact with others until you get your results (4-5 days)  - Avoid travel on public transportation if possible (such as bus, train, or airplane) or o Sent to the Emergency Department by EMS for evaluation, COVID-19 testing, and possible  admission depending on your condition and test results.  What to do if you are LOW RISK for COVID-19?  Reduce your risk of any infection by using the same precautions used for avoiding the common cold or flu:  . Wash your hands often with soap and warm water for at least 20 seconds.  If soap and water are not readily available, use an alcohol-based hand sanitizer with at least 60% alcohol.  . If coughing or sneezing, cover your mouth and nose by coughing or sneezing into the elbow areas of your shirt or coat, into a tissue or into your sleeve (not your hands). . Avoid shaking hands with others and consider head nods or verbal greetings only. . Avoid touching your eyes, nose, or mouth with unwashed hands.  . Avoid close contact with people who are sick. . Avoid places or events with large numbers of people in one location, like concerts or sporting events. . Carefully consider travel plans you have or are making. . If you are planning any travel outside or inside the [place], visit the CDC's Travelers' Health webpage for the latest health notices. . If you have some symptoms but not all symptoms, continue to monitor at home and seek medical attention if your symptoms worsen. . If you are having a medical emergency, call 911.   ADDITIONAL HEALTHCARE OPTIONS FOR PATIENTS  [facility] / e-Visit: [email or web address]         [facility]: [phone]  [facility]: [phone]                   [facility]: [phone]   

## 2019-12-20 NOTE — Progress Notes (Signed)
Patient in for one month follow up. States hse has dilators instructions and gel packets given. Denies any issue or complaints of.

## 2019-12-20 NOTE — Progress Notes (Signed)
Radiation Oncology         [phone] ________________________________  Name: Autumn Frazier MRN: [id number]  Date: 12/20/2019  DOB: 01-22-1941  Follow-Up Visit Note  CC: Josetta Huddle, MD  Hart Rochester, MD    ICD-10-CM   1. Endometrial cancer ([place])  C54.1     Diagnosis: Recurrent stage IIIC serous carcinoma of the uteruswith an isolated recurrence in the left periaortic region  Interval Since Last Radiation:  One month and six days.  Radiation Treatment Dates: 10/07/2019 through 11/16/2019 Site Technique Total Dose (Gy) Dose per Fx (Gy) Completed Fx Beam Energies  Abdomen: Abd IMRT 56/56 2 28/28 6X    Narrative:  The patient returns today for routine follow-up. No significant interval history since the end of treatment.  On review of systems, she reports feeling well.  She experienced some mild fatigue but no other symptoms. She denies any flank or back pain.  She denies any nausea or problems with her bowels.  She denies any vaginal bleeding..                            ALLERGIES:  has No Known Allergies.  Meds: Current Outpatient Medications  Medication Sig Dispense Refill  . acetaminophen (TYLENOL) 500 MG tablet Take 1,000 mg by mouth every 6 (six) hours as needed for moderate pain.     Marland Kitchen anastrozole (ARIMIDEX) 1 MG tablet Take 1 tablet (1 mg total) by mouth daily. 90 tablet 4  . calcium carbonate (OS-CAL) 600 MG TABS tablet Take 600 mg by mouth 2 (two) times daily with a meal.     . cholecalciferol (VITAMIN D) 1000 units tablet Take 1,000 Units by mouth daily.    Marland Kitchen escitalopram (LEXAPRO) 20 MG tablet Take 20 mg by mouth daily.   11  . HYDROcodone-acetaminophen (NORCO/VICODIN) 5-325 MG tablet Take 1-2 tablets by mouth every 6 (six) hours as needed. 15 tablet 0  . levothyroxine (SYNTHROID, LEVOTHROID) 88 MCG tablet Take 88 mcg by mouth daily before breakfast.   11  . ondansetron (ZOFRAN) 4 MG tablet Take 1 tablet (4 mg total) by mouth every 8 (eight) hours as needed  for nausea or vomiting. 20 tablet 1  . Zinc 50 MG TABS Take 50 mg by mouth daily.     No current facility-administered medications for this encounter.   Facility-Administered Medications Ordered in Other Encounters  Medication Dose Route Frequency Provider Last Rate Last Admin  . sodium chloride flush (NS) 0.9 % injection 10 mL  10 mL Intravenous PRN Gery Pray, MD   10 mL at 09/09/17 1351    Physical Findings: The patient is in no acute distress. Patient is alert and oriented.  height is 5\' 6"  (1.676 m) and weight is 163 lb 6 oz (74.1 kg). Her temporal temperature is 98.9 F (37.2 C). Her blood pressure is 127/83 and her pulse is 67. Her respiration is 18 and oxygen saturation is 100%.  No significant changes. Lungs are clear to auscultation bilaterally. Heart has regular rate and rhythm. No palpable cervical, supraclavicular, or axillary adenopathy. Abdomen soft, non-tender, normal bowel sounds.   Lab Findings: Lab Results  Component Value Date   WBC 6.6 10/08/2019   HGB 12.5 10/08/2019   HCT 38.1 10/08/2019   MCV 98.7 10/08/2019   PLT 189 10/08/2019    Radiographic Findings: No results found.  Impression:  Recurrent stage IIIC serous carcinoma of the uteruswith an isolated recurrence in  the left periaortic region  Overall she tolerated tolerated her radiation therapy quite well.  Plan: The patient is scheduled to follow-up with Dr. Polly Cobia on 01/06/2020.  Scans to be ordered by Dr. Polly Cobia at that time.  routine follow-up with radiation oncology in 3 months.  ___________________________________   Blair Promise, PhD, MD  This document serves as a record of services personally performed by Gery Pray, MD. It was created on his behalf by Clerance Lav, a trained medical scribe. The creation of this record is based on the scribe's personal observations and the provider's statements to them. This document has been checked and approved by the attending provider.

## 2019-12-21 ENCOUNTER — Telehealth: Payer: Self-pay | Admitting: *Deleted

## 2019-12-21 NOTE — Telephone Encounter (Signed)
CALLED PATIENT TO INFORM OF FU APPT. ON 03/20/20 @ 4 PM WITH DR. KINARD, SPOKE WITH PATIENT AND SHE IS AWARE OF THIS APPT.

## 2020-01-04 ENCOUNTER — Other Ambulatory Visit: Payer: Self-pay | Admitting: Oncology

## 2020-01-04 DIAGNOSIS — Z9889 Other specified postprocedural states: Secondary | ICD-10-CM

## 2020-01-06 DIAGNOSIS — Z8542 Personal history of malignant neoplasm of other parts of uterus: Secondary | ICD-10-CM | POA: Diagnosis not present

## 2020-01-06 DIAGNOSIS — Z79899 Other long term (current) drug therapy: Secondary | ICD-10-CM | POA: Diagnosis not present

## 2020-01-06 DIAGNOSIS — C541 Malignant neoplasm of endometrium: Secondary | ICD-10-CM | POA: Diagnosis not present

## 2020-01-06 DIAGNOSIS — F329 Major depressive disorder, single episode, unspecified: Secondary | ICD-10-CM | POA: Diagnosis not present

## 2020-01-06 DIAGNOSIS — G629 Polyneuropathy, unspecified: Secondary | ICD-10-CM | POA: Diagnosis not present

## 2020-01-06 DIAGNOSIS — Z08 Encounter for follow-up examination after completed treatment for malignant neoplasm: Secondary | ICD-10-CM | POA: Diagnosis not present

## 2020-01-21 ENCOUNTER — Telehealth: Payer: Self-pay | Admitting: Oncology

## 2020-01-21 ENCOUNTER — Other Ambulatory Visit: Payer: Self-pay | Admitting: Oncology

## 2020-01-21 DIAGNOSIS — Z170 Estrogen receptor positive status [ER+]: Secondary | ICD-10-CM

## 2020-01-21 DIAGNOSIS — C50512 Malignant neoplasm of lower-outer quadrant of left female breast: Secondary | ICD-10-CM

## 2020-01-21 DIAGNOSIS — M818 Other osteoporosis without current pathological fracture: Secondary | ICD-10-CM

## 2020-01-21 NOTE — Telephone Encounter (Signed)
Scheduled appt per 4/30 [facility] message - unable to reach pt . Left message for pt to call back for appt

## 2020-01-31 DIAGNOSIS — K7689 Other specified diseases of liver: Secondary | ICD-10-CM | POA: Diagnosis not present

## 2020-01-31 DIAGNOSIS — D7389 Other diseases of spleen: Secondary | ICD-10-CM | POA: Diagnosis not present

## 2020-01-31 DIAGNOSIS — C541 Malignant neoplasm of endometrium: Secondary | ICD-10-CM | POA: Diagnosis not present

## 2020-01-31 DIAGNOSIS — J984 Other disorders of lung: Secondary | ICD-10-CM | POA: Diagnosis not present

## 2020-01-31 DIAGNOSIS — K449 Diaphragmatic hernia without obstruction or gangrene: Secondary | ICD-10-CM | POA: Diagnosis not present

## 2020-02-18 ENCOUNTER — Other Ambulatory Visit: Payer: Self-pay

## 2020-02-18 ENCOUNTER — Ambulatory Visit
Admission: RE | Admit: 2020-02-18 | Discharge: 2020-02-18 | Disposition: A | Discharge status: Home / Self Care | Payer: Medicare HMO | Source: Ambulatory Visit | Attending: Oncology | Admitting: Oncology

## 2020-02-18 DIAGNOSIS — R928 Other abnormal and inconclusive findings on diagnostic imaging of breast: Secondary | ICD-10-CM | POA: Diagnosis not present

## 2020-02-18 DIAGNOSIS — Z9889 Other specified postprocedural states: Secondary | ICD-10-CM

## 2020-02-25 DIAGNOSIS — C541 Malignant neoplasm of endometrium: Secondary | ICD-10-CM | POA: Diagnosis not present

## 2020-02-25 DIAGNOSIS — Z452 Encounter for adjustment and management of vascular access device: Secondary | ICD-10-CM | POA: Diagnosis not present

## 2020-03-01 ENCOUNTER — Ambulatory Visit: Payer: Medicare HMO | Admitting: Neurology

## 2020-03-03 ENCOUNTER — Other Ambulatory Visit: Payer: Self-pay | Admitting: Oncology

## 2020-03-03 ENCOUNTER — Other Ambulatory Visit: Payer: Self-pay | Admitting: *Deleted

## 2020-03-03 DIAGNOSIS — C50512 Malignant neoplasm of lower-outer quadrant of left female breast: Secondary | ICD-10-CM

## 2020-03-06 ENCOUNTER — Inpatient Hospital Stay: Payer: Medicare HMO

## 2020-03-06 ENCOUNTER — Inpatient Hospital Stay: Payer: Medicare HMO | Admitting: Oncology

## 2020-03-20 ENCOUNTER — Other Ambulatory Visit: Payer: Self-pay

## 2020-03-20 ENCOUNTER — Encounter: Payer: Self-pay | Admitting: Radiation Oncology

## 2020-03-20 ENCOUNTER — Ambulatory Visit
Admission: RE | Admit: 2020-03-20 | Discharge: 2020-03-20 | Disposition: A | Discharge status: Home / Self Care | Payer: Medicare HMO | Source: Ambulatory Visit | Attending: Radiation Oncology | Admitting: Radiation Oncology

## 2020-03-20 DIAGNOSIS — Z923 Personal history of irradiation: Secondary | ICD-10-CM | POA: Insufficient documentation

## 2020-03-20 DIAGNOSIS — Z79899 Other long term (current) drug therapy: Secondary | ICD-10-CM | POA: Diagnosis not present

## 2020-03-20 DIAGNOSIS — Z08 Encounter for follow-up examination after completed treatment for malignant neoplasm: Secondary | ICD-10-CM | POA: Diagnosis not present

## 2020-03-20 DIAGNOSIS — C541 Malignant neoplasm of endometrium: Secondary | ICD-10-CM

## 2020-03-20 DIAGNOSIS — Z8542 Personal history of malignant neoplasm of other parts of uterus: Secondary | ICD-10-CM | POA: Diagnosis not present

## 2020-03-20 NOTE — Progress Notes (Signed)
Radiation Oncology         [phone] ________________________________  Name: Autumn Frazier MRN: [id number]  Date: 03/20/2020  DOB: [birth year]  Follow-Up Visit Note  CC: Josetta Huddle, MD  Hart Rochester, MD    ICD-10-CM   1. 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  Radiation Treatment Dates: 10/07/2019 through 11/16/2019 Site Technique Total Dose (Gy) Dose per Fx (Gy) Completed Fx Beam Energies  Abdomen: Abd IMRT 56/56 2 28/28 6X    Narrative:  The patient returns today for routine follow-up. She is doing well overall. She was seen by Dr. Polly Cobia on 01/06/2020, during which time she was noted to be doing very well without overt evidence of active or recurrent disease.   CT of chest/abdomen/pelvis on 01/31/2020 show remote granulomatous disease without evidence of metastatic disease. The previously seen hypermetabolic enlarged left para-aortic lymph node was no longer present. Renal and hepatic cysts were stable. Pelvic and sacral fractures were also stable.  She underwent a bilateral diagnostic mammogram on 02/18/2020 that did not show any mammographic evidence of malignancy.  On review of systems, she reports no complaints. She denies fatigue, pelvic pain, vaginal bleeding/discharge, and change in bowel patterns.  ALLERGIES:  has No Known Allergies.  Meds: Current Outpatient Medications  Medication Sig Dispense Refill  . acetaminophen (TYLENOL) 500 MG tablet Take 1,000 mg by mouth every 6 (six) hours as needed for moderate pain.     Marland Kitchen anastrozole (ARIMIDEX) 1 MG tablet Take 1 tablet (1 mg total) by mouth daily. 90 tablet 4  . calcium carbonate (OS-CAL) 600 MG TABS tablet Take 600 mg by mouth 2 (two) times daily with a meal.     . cholecalciferol (VITAMIN D) 1000 units tablet Take 1,000 Units by  mouth daily.    Marland Kitchen escitalopram (LEXAPRO) 20 MG tablet Take 20 mg by mouth daily.   11  . levothyroxine (SYNTHROID, LEVOTHROID) 88 MCG tablet Take 88 mcg by mouth daily before breakfast.   11  . Zinc 50 MG TABS Take 50 mg by mouth daily.    Marland Kitchen HYDROcodone-acetaminophen (NORCO/VICODIN) 5-325 MG tablet Take 1-2 tablets by mouth every 6 (six) hours as needed. (Patient not taking: Reported on 03/20/2020) 15 tablet 0  . ondansetron (ZOFRAN) 4 MG tablet Take 1 tablet (4 mg total) by mouth every 8 (eight) hours as needed for nausea or vomiting. (Patient not taking: Reported on 03/20/2020) 20 tablet 1   No current facility-administered medications for this encounter.   Facility-Administered Medications Ordered in Other Encounters  Medication Dose Route Frequency Provider Last Rate Last Admin  . sodium chloride flush (NS) 0.9 % injection 10 mL  10 mL Intravenous PRN Gery Pray, MD   10 mL at 09/09/17 1351    Physical Findings: The patient is in no acute distress. Patient is alert and oriented.  height is 5\' 6"  (1.676 m) and weight is 165 lb (74.8 kg). Her temperature is 97.8 F (36.6 C). Her blood pressure is 121/83 and her pulse is 80. Her respiration is 20 and oxygen saturation is 97%.  No significant changes. Lungs are clear to auscultation bilaterally. Heart has regular rate and rhythm. No palpable cervical, supraclavicular, or axillary adenopathy. Abdomen soft, non-tender, normal bowel sounds. Pelvic exam deferred in light of her close follow-up with Dr. Polly Cobia  Lab Findings: Lab Results  Component Value Date   WBC 6.6 10/08/2019   HGB 12.5 10/08/2019   HCT 38.1 10/08/2019   MCV 98.7 10/08/2019   PLT 189 10/08/2019    Radiographic Findings: No results found.  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  Recent scans at [facility] also show resolution  of the periaortic node  Plan: The patient is scheduled to follow-up with Dr. Polly Cobia on 04/06/2020 and with Dr. Jana Hakim on 04/17/2020. She will follow-up with radiation oncology on an as-needed basis.  Total time spent in this encounter was 15 minutes which included reviewing the patient's most recent follow-ups, CT scan, mammogram, physical examination, and documentation.  ___________________________________   Blair Promise, PhD, MD  This document serves as a record of services personally performed by Gery Pray, MD. It was created on his behalf by Clerance Lav, a trained medical scribe. The creation of this record is based on the scribe's personal observations and the provider's statements to them. This document has been checked and approved by the attending provider.

## 2020-03-20 NOTE — Progress Notes (Signed)
Patient here for a f/u visit with Dr. Sondra Come. Last radiation tx was in 10/2019.Patient denies fatigue or pain. She denies  Vaginal bleeding  or problems with her bowels.

## 2020-03-23 ENCOUNTER — Ambulatory Visit: Payer: Medicare HMO | Admitting: Neurology

## 2020-03-24 ENCOUNTER — Other Ambulatory Visit: Payer: Medicare HMO

## 2020-04-16 NOTE — Progress Notes (Signed)
[facility]  [phone] [phone]     ID: Autumn Frazier DOB: 10/15/1940  MR#: [id number]  JQB#:[id number]  Patient Care Team: Josetta Huddle, MD as PCP - General (Internal Medicine) Rolm Bookbinder, MD as Consulting Physician (General Surgery) Jennika Ringgold, Virgie Dad, MD as Consulting Physician (Oncology) Gery Pray, MD as Consulting Physician (Radiation Oncology) Polly Cobia Dayton Bailiff, MD as Referring Physician (Obstetrics and Gynecology) OTHER MD:   CHIEF COMPLAINT: Estrogen receptor positive breast cancer; endometrial cancer  CURRENT TREATMENT: anastrozole   INTERVAL HISTORY: Autumn Frazier returns today for follow-up of her estrogen receptor positive breast cancer.   She continues on anastrozole.  She reports no side effects from this.  We do not have a recent bone density and that is the only concern here.  Since her last visit, she underwent bilateral diagnostic mammography with tomography at [facility] on 02/18/2020 showing: breast density category B; no evidence of malignancy in either breast.   She was found to have increased hypermetabolic activity in a 1.6 cm left ara-aortic retroperitoneal lymph node on PET scan in 08/2019. She received radiation therapy to the lymph node from 10/07/2019 through 11/16/2019 under Dr. Sondra Come, who has followed her since diagnosis.  Her most recent CT chest, abdomen, pelvis on 01/31/2020 were obtained at [facility], where she is followed by Dr. Polly Cobia. This showed no evidence of metastatic disease. The previously seen lymph node was no longer present.  Of note, her CA 125, which has been followed at [facility], has remained on the lower side of normal: Component 01/06/20 09/02/19 08/12/19 07/22/19 06/24/19 06/03/19  CA 125 8.8  10.7  12.9  10.9  11.5  10.8     REVIEW OF SYSTEMS: Autumn Frazier had the Materna vaccine x2 without complications.  All her family also has been vaccinated.  She tolerated the most recent radiation  treatment well with no diarrhea in particular.  She fell and fractured her sacrum which has limited her exercise.  We discussed a water aerobics or walking in a swimming pool and she will consider that.  Since her husband's death 2 years ago she has moved to a smaller house and she no longer has to climb many stairs.  A detailed review of systems today was otherwise stable  BREAST CANCER HISTORY: From the original intake note:   "Autumn Frazier" had routine screening mammography with tomography of the [facility] 10/31/2015. A possible mass associated with calcifications was noted in the left breast. Accordingly the patient proceeded to digital diagnostic left mammography and ultrasonography the same day. Breast density was category B. In the left breast that was a small ill-defined mass at approximately 4:00 measuring 7 mm mammographically. Also there was a small group of heterogeneous calcifications in a linear configuration in the upper outer quadrant of the left breast, at the 12:30 o'clock position. The calcifications spanned a 0.4 cm. There were approximately 3 cm superior to the mass. There was a second group of heterogeneous calcifications approximately 1 cm posterior and lateral to the mass. These spanned 1.1 cm. The distance between both groups of calcifications (with a mass in between) was 4.5 cm.  I examined her was no palpable abnormality. By ultrasonography there was a small irregular hypoechoic mass at the 3:30 o'clock position of the left breast measuring 0.6 cm maximally. Ultrasound of the axilla was unremarkable.  On the same day the patient underwent biopsy of the left breast mass in question and also the calcifications at the 12:30 o'clock position. The breast  mass ([id number]) proved to be an invasive ductal carcinoma, grade 1, estrogen receptor 100% positive, progesterone receptor 30% positive, both with strong staining intensity, with an MIB-1 of 10%, and no HER-2/neu amplification, the  signals ratio being 1.50 and the number [place] 2.25.  The area of calcifications at 12:30 o'clock showed usual ductal hyperplasia without atypia.  On 11/06/2015 Autumn Frazier underwent biopsy of a second area of calcifications less than 2 cm away from the primary mass and this showed (S [id number]) atypical ductal hyperplasia.  ENDOMETRIAL CANCER HISTORY: From follow up note on 06/30/2017:  She had some spotting, brought this to medical attention, and had an endometrial biopsy 03/11/2017 which was nondiagnostic, but transvaginal ultrasound at the same time showed a fluid filled endometrial cavity and a 3.6 cm hyperechoic mass on the posterior wall. There was also a separate 1.17 m hyperechoic mass on the anterior wall. Neither of these were vascular. Altogether the endometrium measured 4.35 cm. There was also a questionable cervical mass.  The patient was referred to Dr. Polly Cobia and after appropriate discussion underwent robotic hysterectomy with bilateral salpingo-oophorectomy and sentinel lymph node sampling on 04/01/2017. The final pathology (S and [id number]) showed 2 areas of high-grade endometrial adenocarcinoma, serous type, measuring 2.5 and 1.5 cm, with no definitive myometrial invasion seen. The ovaries and fallopian tubes were benign. However 2 right obturator lymph nodes and 2 left obturator lymph nodes were all involved by disease.  Additional tests on the tumor tissue found retained nuclear expression for all 4 mismatch repair proteins tested (ML H1, PMS 2, [place] 2, MSH 6) and estrogen receptor positivity in the week intermediate range at 90%, with less than 1% progesterone receptor, but HER-2/neu 3+ by immunohistochemistry.  She was treated with IMRT and HDR radiation treatments under Dr. Sondra Come followed by Herceptin under Dr. Polly Cobia.  Subsequent history is as detailed below.   PAST MEDICAL HISTORY: Past Medical History:  Diagnosis Date  . Breast cancer ([place])   . Breast cancer of  lower-outer quadrant of left female breast ([place]) 11/02/2015  . Endometrial cancer ([place])   . History of brachytherapy 08/27/17-09/11/17   vaginal cuff 18 Gy in 3 fractions  . History of radiation therapy 07/14/2017-08/19/17   pelvis 45 Gy in 25 fractions  . Hx of radiation therapy 01/09/16- 02/06/16   Left Breast  . Personal history of chemotherapy 04/2017   for uterine ca  . Personal history of radiation therapy 2017   for breast ca  . Personal history of radiation therapy 2018   for uterine ca    PAST SURGICAL HISTORY: Past Surgical History:  Procedure Laterality Date  . BREAST BIOPSY Left 11/06/2015   benign  . BREAST BIOPSY Left 10/31/2015   malignant  . BREAST BIOPSY Left 10/31/2015   benign  . BREAST BIOPSY Left 10/15/2013   benign  . BREAST LUMPECTOMY Left   . herniated disc    . RADIOACTIVE SEED GUIDED PARTIAL MASTECTOMY WITH AXILLARY SENTINEL LYMPH NODE BIOPSY Left 11/27/2015   Procedure: LEFT BREAST SEED AND WIRE GUIDED LUMPECTOMY WITH LEFT AXILLARY LYMPH NODE BIOPSY;  Surgeon: Rolm Bookbinder, MD;  Location: [place];  Service: General;  Laterality: Left;  . ROBOTIC ASSISTED TOTAL HYSTERECTOMY WITH BILATERAL SALPINGO OOPHERECTOMY  04/01/2017   ROBOTIC HYSTERECTOMY WITH BSO, SENTINEL NODE MAPPING AND D&C by Dr. Polly Cobia  . TONSILLECTOMY      FAMILY HISTORY Family History  Problem Relation Age of Onset  . Brain cancer Sister   . Ovarian  cancer Maternal Aunt   . Breast cancer Other 56    the patient's father died from heart disease at the age of 2. The patient's mother died from heart failure at the age of 10. The patient had one brother who was shot in an accident at the age of 58. She has 1 sister. On the mother's side and aunt at age 4 was diagnosed with ovarian cancer. The patient's own sister was diagnosed with a brain tumor at the age of 62 and died 24 years later.   GYNECOLOGIC HISTORY:  No LMP recorded. Patient is postmenopausal. Menarche age  46, first live birth age 57, the patient is [place] P3. She stopped having periods in 1997. She never used hormone replacement. She never used oral contraceptives.   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 Her husband Silvestre Moment worked in [employer] fund[employer] for many years and then started the [employer] here in town. He passed away 2018/09/28. Daughter ELIJAH MICHAELIS is a homemaker in [place]. Son MADORA BARLETTA the third lives in [place] were he works in [place] for [employer]. Daughter Blane Ohara lives in [place] where she works as an Immunologist. The patient has 10 grandchildren (7 blood and 3 step). She attends [employer] In [employer]; the patient believes her son has been named as her healthcare power of attorney.  She will confirm and let us know next visit   HEALTH MAINTENANCE: Social History   Tobacco Use  . Smoking status: Former Smoker    Types: Cigarettes    Quit date: 07/02/1982    Years since quitting: 37.8  . Smokeless tobacco: Never Used  Vaping Use  . Vaping Use: Never used  Substance Use Topics  . Alcohol use: Yes    Alcohol/week: 3.0 standard drinks    Types: 3 Glasses of wine per week  . Drug use: No    Colonoscopy: 2011?  PAP:  Bone density: remote  Lipid panel:  No Known Allergies  Current Outpatient Medications  Medication Sig Dispense Refill  . acetaminophen (TYLENOL) 500 MG tablet Take 1,000 mg by mouth every 6 (six) hours as needed for moderate pain.     Marland Kitchen anastrozole (ARIMIDEX) 1 MG tablet Take 1 tablet (1 mg total) by mouth daily. 90 tablet 4  . calcium carbonate (OS-CAL) 600 MG TABS tablet Take 600 mg by mouth 2 (two) times daily with a meal.     . cholecalciferol (VITAMIN D) 1000 units tablet Take 1,000 Units by mouth daily.    Marland Kitchen escitalopram (LEXAPRO) 20 MG tablet Take 20 mg by mouth daily.   11  . HYDROcodone-acetaminophen (NORCO/VICODIN) 5-325 MG  tablet Take 1-2 tablets by mouth every 6 (six) hours as needed. (Patient not taking: Reported on 03/20/2020) 15 tablet 0  . levothyroxine (SYNTHROID, LEVOTHROID) 88 MCG tablet Take 88 mcg by mouth daily before breakfast.   11  . ondansetron (ZOFRAN) 4 MG tablet Take 1 tablet (4 mg total) by mouth every 8 (eight) hours as needed for nausea or vomiting. (Patient not taking: Reported on 03/20/2020) 20 tablet 1  . Zinc 50 MG TABS Take 50 mg by mouth daily.     No current facility-administered medications for this visit.   Facility-Administered Medications Ordered in Other Visits  Medication Dose Route Frequency Provider Last Rate Last Admin  . sodium chloride flush (NS) 0.9 % injection 10 mL  10 mL Intravenous PRN  Gery Pray, MD   10 mL at 09/09/17 1351    Objective:  white woman who appears younger than stated age  4:   04/17/20 1016  BP: (!) 143/76  Pulse: 67  Resp: 18  Temp: 98.3 F (36.8 C)  SpO2: 100%   Body mass index is 27.07 kg/m.  . Wt Readings from Last 3 Encounters:  04/17/20 167 lb 11.2 oz (76.1 kg)  03/20/20 165 lb (74.8 kg)  12/20/19 163 lb 6 oz (74.1 kg)   ECOG: 1 - Symptomatic but completely ambulatory  Sclerae unicteric, EOMs intact Wearing a mask No cervical or supraclavicular adenopathy Lungs no rales or rhonchi Heart regular rate and rhythm Abd soft, nontender, positive bowel sounds MSK no focal spinal tenderness, no upper extremity lymphedema Neuro: nonfocal, well oriented, appropriate affect Breasts: The right breast is unremarkable.  The left breast is status post lumpectomy and radiation.  There is no evidence of local recurrence.  Axillae are benign.   LAB RESULTS:  CMP  Recent Results (from the past 2160 hour(s))  CBC with Differential (Cancer Center Only)     Status: Abnormal   Collection Time: 04/17/20  9:48 AM  Result Value Ref Range   WBC Count 4.9 4.0 - 10.5 K/uL   RBC 3.93 3.87 - 5.11 MIL/uL   Hemoglobin 12.3 12.0 - 15.0 g/dL    HCT 39.2 36 - 46 %   MCV 99.7 80.0 - 100.0 fL   MCH 31.3 26.0 - 34.0 pg   MCHC 31.4 30.0 - 36.0 g/dL   RDW 13.9 11.5 - 15.5 %   Platelet Count 189 150 - 400 K/uL   nRBC 0.0 0.0 - 0.2 %   Neutrophils Relative % 52 %   Neutro Abs 2.5 1.7 - 7.7 K/uL   Lymphocytes Relative 13 %   Lymphs Abs 0.7 0.7 - 4.0 K/uL   Monocytes Relative 8 %   Monocytes Absolute 0.4 0 - 1 K/uL   Eosinophils Relative 26 %   Eosinophils Absolute 1.3 (H) 0 - 0 K/uL   Basophils Relative 1 %   Basophils Absolute 0.0 0 - 0 K/uL   Immature Granulocytes 0 %   Abs Immature Granulocytes 0.01 0.00 - 0.07 K/uL    Comment: Performed at [facility], [address] [place], [place] [address]    No results found for: TOTALPROTELP, ALBUMINELP, A1GS, A2GS, BETS, BETA2SER, GAMS, MSPIKE, SPEI  No results found for: TOTALPROTELP, ALBUMINELP, A2GS, BETS, BETA2SER, GAMS, MSPIKE, SPEI  Urinalysis    Component Value Date/Time   COLORURINE RED BIOCHEMICALS MAY BE AFFECTED BY COLOR (A) 04/26/2008 0455   APPEARANCEUR CLOUDY (A) 04/26/2008 0455   LABSPEC 1.021 04/26/2008 0455   PHURINE 7.0 04/26/2008 0455   GLUCOSEU NEGATIVE 04/26/2008 0455   HGBUR LARGE (A) 04/26/2008 0455   BILIRUBINUR NEGATIVE 04/26/2008 0455   KETONESUR 15 (A) 04/26/2008 0455   PROTEINUR 30 (A) 04/26/2008 0455   UROBILINOGEN 1.0 04/26/2008 0455   NITRITE NEGATIVE 04/26/2008 0455   LEUKOCYTESUR MODERATE (A) 04/26/2008 0455    STUDIES: No results found.   ASSESSMENT: 79 y.o. [facility] woman status post left breast lower outer quadrant biopsy 10/31/2015 for a clinical T1b N0, stage IA  invasive ductal carcinoma, grade 1, estrogen and progesterone receptor positive, HER-2 nonamplified, with an MIB-1 of 10%  (a) biopsy of an area of calcifications 10/31/2015 showed usual ductal hyperplasia  (b) biopsy of a second area of calcifications 1.8 cm from the breast mass 11/06/2015 showed  atypical ductal hyperplasia  (1) status post left  lumpectomy 11/27/2015 for a pT2 cN0, stage IIA invasive ductal carcinoma, grade 1, repeat HER-2 again negative.  (2) adjuvant radiation 01-09-16 to 02-06-16: Left Breast / 40.05 Gy in 15 fractions, Left Breast Boost / 10 Gy in 5 fractions  (3) anastrozole started 03/18/2016  (a) bone density 12/04/2015 shows a T score of -1.9  (4) the patient opted against genetic testing  ENDOMETRIAL CARCINOMA: (5) endometrial carcinoma, high grade,  pT1a pN1, status post hysterectomy with bilateral salpingo-oophorectomy 04/01/2017  (a) adjuvant chemotherapy with carboplatin, paclitaxel, and trastuzumab started 04/24/2017  (b) adjuvant radiation    1. 07/14/2017 - 08/19/2017: Pelvis / 45 Gy in 25 fractions   2. 08/27/2017, 09/04/2017, 09/11/2017: Brachytherapy Boost to vaginal cuff / 18 Gy in 3 fractions  (c) continuing trastuzumab every 3 months  (6) isolated endometrial cancer recurrence noted December 2020 in the left periaortic region, status post radiation Radiation Treatment Dates: 10/07/2019 through 11/16/2019 Site Technique Total Dose (Gy) Dose per Fx (Gy) Completed Fx Beam Energies  Abdomen: Abd IMRT 56/56 2 28/28 6X    (a) follow-up CT of the chest abdomen and pelvis 01/31/2020 showed resolution of the left para-aortic lymph node previously noted and no evidence of metastatic disease.   PLAN: Autumn Frazier is now a little over 4 years out from definitive surgery for her breast cancer with no evidence of disease recurrence.  This is favorable.  She is tolerating anastrozole well and the plan is to continue that a total of 5 years which she will complete by the time she sees me again a year from now.  We do not have a recent bone density.  I will obtain 1 in May 2021 with her next mammogram.  Incidentally she was alerted that that will be her last diagnostic  She is followed closely for endometrial cancer by Dr. Donzetta Starch.  It is favorable that in the most recent scans there was no evidence of  active disease  Opal Sidles will likely graduate at her next visit here  She knows to call for any other issue that may develop before then  Total encounter time 25 minutes.Chauncey Cruel, MD  04/17/20 10:29 AM  Medical Oncology and Hematology [facility] [place], [place] [address] Tel. [phone]    Fax. [phone]   I, Wilburn Mylar, am acting as scribe for Dr. Virgie Dad. Nathanel Tallman.  I, Lurline Del MD, have reviewed the above documentation for accuracy and completeness, and I agree with the above.   *Total Encounter Time as defined by the Centers for Medicare and Medicaid Services includes, in addition to the face-to-face time of a patient visit (documented in the note above) non-face-to-face time: obtaining and reviewing outside history, ordering and reviewing medications, tests or procedures, care coordination (communications with other health care professionals or caregivers) and documentation in the medical record.

## 2020-04-17 ENCOUNTER — Other Ambulatory Visit: Payer: Self-pay

## 2020-04-17 ENCOUNTER — Telehealth: Payer: Self-pay | Admitting: Oncology

## 2020-04-17 ENCOUNTER — Inpatient Hospital Stay: Payer: Medicare HMO | Attending: Oncology

## 2020-04-17 ENCOUNTER — Inpatient Hospital Stay: Payer: Medicare HMO | Admitting: Oncology

## 2020-04-17 VITALS — BP 143/76 | HR 67 | Temp 98.3°F | Resp 18 | Ht 66.0 in | Wt 167.7 lb

## 2020-04-17 DIAGNOSIS — Z9221 Personal history of antineoplastic chemotherapy: Secondary | ICD-10-CM | POA: Diagnosis not present

## 2020-04-17 DIAGNOSIS — Z87891 Personal history of nicotine dependence: Secondary | ICD-10-CM | POA: Diagnosis not present

## 2020-04-17 DIAGNOSIS — Z79899 Other long term (current) drug therapy: Secondary | ICD-10-CM | POA: Diagnosis not present

## 2020-04-17 DIAGNOSIS — Z8542 Personal history of malignant neoplasm of other parts of uterus: Secondary | ICD-10-CM | POA: Diagnosis not present

## 2020-04-17 DIAGNOSIS — Z170 Estrogen receptor positive status [ER+]: Secondary | ICD-10-CM

## 2020-04-17 DIAGNOSIS — C541 Malignant neoplasm of endometrium: Secondary | ICD-10-CM | POA: Diagnosis not present

## 2020-04-17 DIAGNOSIS — Z923 Personal history of irradiation: Secondary | ICD-10-CM | POA: Diagnosis not present

## 2020-04-17 DIAGNOSIS — Z90710 Acquired absence of both cervix and uterus: Secondary | ICD-10-CM | POA: Insufficient documentation

## 2020-04-17 DIAGNOSIS — Z79811 Long term (current) use of aromatase inhibitors: Secondary | ICD-10-CM | POA: Insufficient documentation

## 2020-04-17 DIAGNOSIS — C50412 Malignant neoplasm of upper-outer quadrant of left female breast: Secondary | ICD-10-CM | POA: Diagnosis not present

## 2020-04-17 DIAGNOSIS — C50512 Malignant neoplasm of lower-outer quadrant of left female breast: Secondary | ICD-10-CM

## 2020-04-17 DIAGNOSIS — Z90722 Acquired absence of ovaries, bilateral: Secondary | ICD-10-CM | POA: Diagnosis not present

## 2020-04-17 LAB — CMP (CANCER CENTER ONLY)
ALT: 12 U/L (ref 0–44)
AST: 13 U/L — ABNORMAL LOW (ref 15–41)
Albumin: 3.8 g/dL (ref 3.5–5.0)
Alkaline Phosphatase: 75 U/L (ref 38–126)
Anion gap: 7 (ref 5–15)
BUN: 20 mg/dL (ref 8–23)
CO2: 26 mmol/L (ref 22–32)
Calcium: 10 mg/dL (ref 8.9–10.3)
Chloride: 108 mmol/L (ref 98–111)
Creatinine: 1.16 mg/dL — ABNORMAL HIGH (ref 0.44–1.00)
GFR, Est AFR Am: 51 mL/min — ABNORMAL LOW (ref >60)
GFR, Estimated: 44 mL/min — ABNORMAL LOW (ref >60)
Glucose, Bld: 64 mg/dL — ABNORMAL LOW (ref 70–99)
Potassium: 4.6 mmol/L (ref 3.5–5.1)
Sodium: 141 mmol/L (ref 135–145)
Total Bilirubin: 0.4 mg/dL (ref 0.3–1.2)
Total Protein: 6.7 g/dL (ref 6.5–8.1)

## 2020-04-17 LAB — CBC WITH DIFFERENTIAL (CANCER CENTER ONLY)
Abs Immature Granulocytes: 0.01 10*3/uL (ref 0.00–0.07)
Basophils Absolute: 0 10*3/uL (ref 0.0–0.1)
Basophils Relative: 1 %
Eosinophils Absolute: 1.3 10*3/uL — ABNORMAL HIGH (ref 0.0–0.5)
Eosinophils Relative: 26 %
HCT: 39.2 % (ref 36.0–46.0)
Hemoglobin: 12.3 g/dL (ref 12.0–15.0)
Immature Granulocytes: 0 %
Lymphocytes Relative: 13 %
Lymphs Abs: 0.7 10*3/uL (ref 0.7–4.0)
MCH: 31.3 pg (ref 26.0–34.0)
MCHC: 31.4 g/dL (ref 30.0–36.0)
MCV: 99.7 fL (ref 80.0–100.0)
Monocytes Absolute: 0.4 10*3/uL (ref 0.1–1.0)
Monocytes Relative: 8 %
Neutro Abs: 2.5 10*3/uL (ref 1.7–7.7)
Neutrophils Relative %: 52 %
Platelet Count: 189 10*3/uL (ref 150–400)
RBC: 3.93 MIL/uL (ref 3.87–5.11)
RDW: 13.9 % (ref 11.5–15.5)
WBC Count: 4.9 10*3/uL (ref 4.0–10.5)
nRBC: 0 % (ref 0.0–0.2)

## 2020-04-17 NOTE — Telephone Encounter (Signed)
Scheduled appts per 7/26 los. Pt declined print out and stated she would refer to mychart.

## 2020-04-20 DIAGNOSIS — C541 Malignant neoplasm of endometrium: Secondary | ICD-10-CM | POA: Diagnosis not present

## 2020-04-27 DIAGNOSIS — C541 Malignant neoplasm of endometrium: Secondary | ICD-10-CM | POA: Diagnosis not present

## 2020-04-28 ENCOUNTER — Other Ambulatory Visit: Payer: Self-pay | Admitting: Oncology

## 2020-05-01 ENCOUNTER — Other Ambulatory Visit: Payer: Self-pay | Admitting: *Deleted

## 2020-05-01 MED ORDER — ANASTROZOLE 1 MG PO TABS: 1.0000 mg | ORAL_TABLET | Freq: Every day | ORAL | 4 refills | Status: DC

## 2020-05-02 ENCOUNTER — Telehealth: Payer: Self-pay | Admitting: *Deleted

## 2020-05-02 NOTE — Telephone Encounter (Addendum)
This RN received VM from pt stating " I have left messages for a couple of days about the anastrazole and that the [facility] needs to refill and it was denied ".  Note this RN followed up per message yesterday - called [facility] and was informed previous request was denied. This RN refilled with verification and called to pt - obtained her VM and left message per above.  This RN called pt back now and again obtained pt's VM. Detailed message left per above information.

## 2020-05-15 ENCOUNTER — Telehealth: Payer: Self-pay

## 2020-05-15 NOTE — Telephone Encounter (Signed)
Pt left a VM and would like a return call ASAP.

## 2020-05-15 NOTE — Telephone Encounter (Signed)
I called pt. No answer, left a message asking pt to call me back.   

## 2020-05-16 ENCOUNTER — Encounter: Payer: Self-pay | Admitting: Neurology

## 2020-05-16 ENCOUNTER — Telehealth: Payer: Self-pay

## 2020-05-16 ENCOUNTER — Ambulatory Visit: Payer: Medicare HMO | Admitting: Neurology

## 2020-05-16 NOTE — Telephone Encounter (Signed)
Pt no showed 05/16/2020 new pt visit with Dr. Rexene Alberts.

## 2020-05-22 ENCOUNTER — Telehealth: Payer: Self-pay | Admitting: Neurology

## 2020-05-22 ENCOUNTER — Telehealth: Payer: Self-pay

## 2020-05-22 NOTE — Telephone Encounter (Signed)
Spoke with patient on the phone, advised that she called 3 days prior to her 8/24 appointment and was put on hold, with no one ever taking the call. She eventually left a voicemail asking [place] to call her back. She never received a call back, and received a letter this weekend stating she had no showed an appointment. She wanted to call and let us know that she had tried to call and cancel the appointment but never received a call back. She said she would call us back at a later time to reschedule the appointment.

## 2020-05-22 NOTE — Telephone Encounter (Signed)
Returned patient's call-had to leave a message on her voicemail

## 2020-05-22 NOTE — Telephone Encounter (Signed)
Voicemail, 9:19am:   Pt says she has been calling for 6 days and would like to be called back.

## 2020-06-08 DIAGNOSIS — Z452 Encounter for adjustment and management of vascular access device: Secondary | ICD-10-CM | POA: Diagnosis not present

## 2020-06-08 DIAGNOSIS — C541 Malignant neoplasm of endometrium: Secondary | ICD-10-CM | POA: Diagnosis not present

## 2020-07-01 DIAGNOSIS — Z20822 Contact with and (suspected) exposure to covid-19: Secondary | ICD-10-CM | POA: Diagnosis not present

## 2020-07-27 DIAGNOSIS — Z452 Encounter for adjustment and management of vascular access device: Secondary | ICD-10-CM | POA: Diagnosis not present

## 2020-07-27 DIAGNOSIS — F32A Depression, unspecified: Secondary | ICD-10-CM | POA: Diagnosis not present

## 2020-07-27 DIAGNOSIS — Z08 Encounter for follow-up examination after completed treatment for malignant neoplasm: Secondary | ICD-10-CM | POA: Diagnosis not present

## 2020-07-27 DIAGNOSIS — C541 Malignant neoplasm of endometrium: Secondary | ICD-10-CM | POA: Diagnosis not present

## 2020-07-27 DIAGNOSIS — Z90710 Acquired absence of both cervix and uterus: Secondary | ICD-10-CM | POA: Diagnosis not present

## 2020-07-27 DIAGNOSIS — Z90722 Acquired absence of ovaries, bilateral: Secondary | ICD-10-CM | POA: Diagnosis not present

## 2020-07-27 DIAGNOSIS — Z79899 Other long term (current) drug therapy: Secondary | ICD-10-CM | POA: Diagnosis not present

## 2020-07-27 DIAGNOSIS — Z7982 Long term (current) use of aspirin: Secondary | ICD-10-CM | POA: Diagnosis not present

## 2020-07-27 DIAGNOSIS — Z8542 Personal history of malignant neoplasm of other parts of uterus: Secondary | ICD-10-CM | POA: Diagnosis not present

## 2020-07-27 DIAGNOSIS — G629 Polyneuropathy, unspecified: Secondary | ICD-10-CM | POA: Diagnosis not present

## 2020-09-07 DIAGNOSIS — H43813 Vitreous degeneration, bilateral: Secondary | ICD-10-CM | POA: Diagnosis not present

## 2020-09-07 DIAGNOSIS — H26492 Other secondary cataract, left eye: Secondary | ICD-10-CM | POA: Diagnosis not present

## 2020-09-07 DIAGNOSIS — H47323 Drusen of optic disc, bilateral: Secondary | ICD-10-CM | POA: Diagnosis not present

## 2020-09-07 DIAGNOSIS — Z95828 Presence of other vascular implants and grafts: Secondary | ICD-10-CM | POA: Diagnosis not present

## 2020-09-07 DIAGNOSIS — H52203 Unspecified astigmatism, bilateral: Secondary | ICD-10-CM | POA: Diagnosis not present

## 2020-09-07 DIAGNOSIS — E039 Hypothyroidism, unspecified: Secondary | ICD-10-CM | POA: Diagnosis not present

## 2020-09-07 DIAGNOSIS — C541 Malignant neoplasm of endometrium: Secondary | ICD-10-CM | POA: Diagnosis not present

## 2020-10-26 DIAGNOSIS — Z95828 Presence of other vascular implants and grafts: Secondary | ICD-10-CM | POA: Diagnosis not present

## 2020-10-26 DIAGNOSIS — Z79899 Other long term (current) drug therapy: Secondary | ICD-10-CM | POA: Diagnosis not present

## 2020-10-26 DIAGNOSIS — F32A Depression, unspecified: Secondary | ICD-10-CM | POA: Diagnosis not present

## 2020-10-26 DIAGNOSIS — C541 Malignant neoplasm of endometrium: Secondary | ICD-10-CM | POA: Diagnosis not present

## 2020-10-26 DIAGNOSIS — G629 Polyneuropathy, unspecified: Secondary | ICD-10-CM | POA: Diagnosis not present

## 2020-10-26 DIAGNOSIS — Z8542 Personal history of malignant neoplasm of other parts of uterus: Secondary | ICD-10-CM | POA: Diagnosis not present

## 2020-10-26 DIAGNOSIS — Z08 Encounter for follow-up examination after completed treatment for malignant neoplasm: Secondary | ICD-10-CM | POA: Diagnosis not present

## 2020-11-02 NOTE — Telephone Encounter (Signed)
Made in error

## 2020-11-21 ENCOUNTER — Other Ambulatory Visit: Payer: Self-pay | Admitting: Oncology

## 2020-11-21 DIAGNOSIS — C541 Malignant neoplasm of endometrium: Secondary | ICD-10-CM

## 2020-11-21 DIAGNOSIS — Z9889 Other specified postprocedural states: Secondary | ICD-10-CM

## 2020-11-21 DIAGNOSIS — C50512 Malignant neoplasm of lower-outer quadrant of left female breast: Secondary | ICD-10-CM

## 2020-11-21 DIAGNOSIS — M810 Age-related osteoporosis without current pathological fracture: Secondary | ICD-10-CM

## 2020-12-25 DIAGNOSIS — Z95828 Presence of other vascular implants and grafts: Secondary | ICD-10-CM | POA: Diagnosis not present

## 2020-12-25 DIAGNOSIS — C541 Malignant neoplasm of endometrium: Secondary | ICD-10-CM | POA: Diagnosis not present

## 2021-01-17 DIAGNOSIS — C50919 Malignant neoplasm of unspecified site of unspecified female breast: Secondary | ICD-10-CM | POA: Diagnosis not present

## 2021-01-17 DIAGNOSIS — C541 Malignant neoplasm of endometrium: Secondary | ICD-10-CM | POA: Diagnosis not present

## 2021-01-17 DIAGNOSIS — R32 Unspecified urinary incontinence: Secondary | ICD-10-CM | POA: Diagnosis not present

## 2021-01-17 DIAGNOSIS — Z79899 Other long term (current) drug therapy: Secondary | ICD-10-CM | POA: Diagnosis not present

## 2021-01-17 DIAGNOSIS — G620 Drug-induced polyneuropathy: Secondary | ICD-10-CM | POA: Diagnosis not present

## 2021-01-17 DIAGNOSIS — E039 Hypothyroidism, unspecified: Secondary | ICD-10-CM | POA: Diagnosis not present

## 2021-01-17 DIAGNOSIS — E559 Vitamin D deficiency, unspecified: Secondary | ICD-10-CM | POA: Diagnosis not present

## 2021-01-17 DIAGNOSIS — Z0000 Encounter for general adult medical examination without abnormal findings: Secondary | ICD-10-CM | POA: Diagnosis not present

## 2021-01-22 DIAGNOSIS — C541 Malignant neoplasm of endometrium: Secondary | ICD-10-CM | POA: Diagnosis not present

## 2021-01-22 DIAGNOSIS — J8410 Pulmonary fibrosis, unspecified: Secondary | ICD-10-CM | POA: Diagnosis not present

## 2021-01-22 DIAGNOSIS — K5730 Diverticulosis of large intestine without perforation or abscess without bleeding: Secondary | ICD-10-CM | POA: Diagnosis not present

## 2021-01-22 DIAGNOSIS — N281 Cyst of kidney, acquired: Secondary | ICD-10-CM | POA: Diagnosis not present

## 2021-01-22 DIAGNOSIS — D7389 Other diseases of spleen: Secondary | ICD-10-CM | POA: Diagnosis not present

## 2021-02-08 DIAGNOSIS — G63 Polyneuropathy in diseases classified elsewhere: Secondary | ICD-10-CM | POA: Diagnosis not present

## 2021-02-08 DIAGNOSIS — C541 Malignant neoplasm of endometrium: Secondary | ICD-10-CM | POA: Diagnosis not present

## 2021-02-08 DIAGNOSIS — E039 Hypothyroidism, unspecified: Secondary | ICD-10-CM | POA: Diagnosis not present

## 2021-02-08 DIAGNOSIS — M5030 Other cervical disc degeneration, unspecified cervical region: Secondary | ICD-10-CM | POA: Diagnosis not present

## 2021-02-08 DIAGNOSIS — Z90710 Acquired absence of both cervix and uterus: Secondary | ICD-10-CM | POA: Diagnosis not present

## 2021-02-08 DIAGNOSIS — I10 Essential (primary) hypertension: Secondary | ICD-10-CM | POA: Diagnosis not present

## 2021-02-08 DIAGNOSIS — Z95828 Presence of other vascular implants and grafts: Secondary | ICD-10-CM | POA: Diagnosis not present

## 2021-02-08 DIAGNOSIS — F32A Depression, unspecified: Secondary | ICD-10-CM | POA: Diagnosis not present

## 2021-02-08 DIAGNOSIS — Z90722 Acquired absence of ovaries, bilateral: Secondary | ICD-10-CM | POA: Diagnosis not present

## 2021-02-14 ENCOUNTER — Telehealth: Payer: Self-pay | Admitting: Oncology

## 2021-02-14 NOTE — Telephone Encounter (Signed)
Reschedule appts, called pt and left a msg

## 2021-03-21 DIAGNOSIS — Z95828 Presence of other vascular implants and grafts: Secondary | ICD-10-CM | POA: Diagnosis not present

## 2021-03-21 DIAGNOSIS — C541 Malignant neoplasm of endometrium: Secondary | ICD-10-CM | POA: Diagnosis not present

## 2021-03-30 ENCOUNTER — Ambulatory Visit
Admission: RE | Admit: 2021-03-30 | Discharge: 2021-03-30 | Disposition: A | Discharge status: Home / Self Care | Payer: Medicare HMO | Source: Ambulatory Visit | Attending: Oncology | Admitting: Oncology

## 2021-03-30 ENCOUNTER — Other Ambulatory Visit: Payer: Self-pay

## 2021-03-30 DIAGNOSIS — Z9889 Other specified postprocedural states: Secondary | ICD-10-CM

## 2021-03-30 DIAGNOSIS — C50512 Malignant neoplasm of lower-outer quadrant of left female breast: Secondary | ICD-10-CM

## 2021-03-30 DIAGNOSIS — Z853 Personal history of malignant neoplasm of breast: Secondary | ICD-10-CM | POA: Diagnosis not present

## 2021-03-30 DIAGNOSIS — C541 Malignant neoplasm of endometrium: Secondary | ICD-10-CM

## 2021-03-30 DIAGNOSIS — R922 Inconclusive mammogram: Secondary | ICD-10-CM | POA: Diagnosis not present

## 2021-04-11 ENCOUNTER — Other Ambulatory Visit: Payer: Self-pay | Admitting: Internal Medicine

## 2021-04-11 DIAGNOSIS — Z1231 Encounter for screening mammogram for malignant neoplasm of breast: Secondary | ICD-10-CM

## 2021-04-17 ENCOUNTER — Other Ambulatory Visit: Payer: Medicare HMO

## 2021-04-17 ENCOUNTER — Ambulatory Visit: Payer: Medicare HMO | Admitting: Oncology

## 2021-04-24 ENCOUNTER — Inpatient Hospital Stay: Payer: Medicare HMO

## 2021-04-24 ENCOUNTER — Inpatient Hospital Stay: Payer: Medicare HMO | Admitting: Oncology

## 2021-05-01 ENCOUNTER — Ambulatory Visit
Admission: RE | Admit: 2021-05-01 | Discharge: 2021-05-01 | Disposition: A | Discharge status: Home / Self Care | Payer: Medicare HMO | Source: Ambulatory Visit | Attending: Oncology | Admitting: Oncology

## 2021-05-01 ENCOUNTER — Other Ambulatory Visit: Payer: Self-pay | Admitting: *Deleted

## 2021-05-01 ENCOUNTER — Other Ambulatory Visit: Payer: Self-pay

## 2021-05-01 DIAGNOSIS — C50512 Malignant neoplasm of lower-outer quadrant of left female breast: Secondary | ICD-10-CM

## 2021-05-01 DIAGNOSIS — M810 Age-related osteoporosis without current pathological fracture: Secondary | ICD-10-CM

## 2021-05-01 DIAGNOSIS — M8589 Other specified disorders of bone density and structure, multiple sites: Secondary | ICD-10-CM | POA: Diagnosis not present

## 2021-05-01 DIAGNOSIS — Z170 Estrogen receptor positive status [ER+]: Secondary | ICD-10-CM

## 2021-05-01 DIAGNOSIS — Z780 Asymptomatic menopausal state: Secondary | ICD-10-CM | POA: Diagnosis not present

## 2021-05-02 ENCOUNTER — Inpatient Hospital Stay: Payer: Medicare HMO | Admitting: Oncology

## 2021-05-02 ENCOUNTER — Inpatient Hospital Stay: Payer: Medicare HMO

## 2021-05-10 DIAGNOSIS — C541 Malignant neoplasm of endometrium: Secondary | ICD-10-CM | POA: Diagnosis not present

## 2021-05-10 DIAGNOSIS — Z79899 Other long term (current) drug therapy: Secondary | ICD-10-CM | POA: Diagnosis not present

## 2021-05-10 DIAGNOSIS — Z95828 Presence of other vascular implants and grafts: Secondary | ICD-10-CM | POA: Diagnosis not present

## 2021-05-10 DIAGNOSIS — F329 Major depressive disorder, single episode, unspecified: Secondary | ICD-10-CM | POA: Diagnosis not present

## 2021-05-10 DIAGNOSIS — I10 Essential (primary) hypertension: Secondary | ICD-10-CM | POA: Diagnosis not present

## 2021-05-10 DIAGNOSIS — G620 Drug-induced polyneuropathy: Secondary | ICD-10-CM | POA: Diagnosis not present

## 2021-05-10 DIAGNOSIS — S5012XA Contusion of left forearm, initial encounter: Secondary | ICD-10-CM | POA: Diagnosis not present

## 2021-05-12 ENCOUNTER — Other Ambulatory Visit: Payer: Self-pay | Admitting: Oncology

## 2021-05-13 NOTE — Progress Notes (Signed)
[place]  [phone] [phone]     ID: Autumn Frazier DOB: 1940/11/28  MR#: [id number]  BDZ#:[id number]  Patient Care Team: Josetta Huddle, MD as PCP - General (Internal Medicine) Rolm Bookbinder, MD as Consulting Physician (General Surgery) Conn Trombetta, Virgie Dad, MD as Consulting Physician (Oncology) Gery Pray, MD as Consulting Physician (Radiation Oncology) Polly Cobia Dayton Bailiff, MD as Referring Physician (Obstetrics and Gynecology) OTHER MD:   CHIEF COMPLAINT: Estrogen receptor positive breast cancer; endometrial cancer  CURRENT TREATMENT: Completing 5 years anastrozole   INTERVAL HISTORY: Autumn Frazier returns today for follow-up of her estrogen receptor positive breast cancer.   She continues on anastrozole.  She has tolerated it very well.  She is now ready to come off that medication.  Since her last visit, she underwent bilateral diagnostic mammography with tomography at [place] on 03/30/2021 showing: breast density category B; no evidence of malignancy in either breast.   She also underwent bone density screening on 05/01/2021 showing a T-score of -2.3, which is considered osteopenic.  Her most recent CT chest, abdomen, pelvis on 01/22/2021 were obtained at [facility], where she is followed by Dr. Polly Cobia. This showed no evidence of metastatic disease.   Of note, her CA 125, which has been followed at [facility], has remained on the lower side of normal: Component 02/08/21 10/26/20 07/27/20 04/20/20 01/06/20 09/02/19  CA 125 13  11.5  11.5  9.3  8.8  10.7    She is already scheduled for her next screening mammogram on 04/01/2022.   REVIEW OF SYSTEMS: Autumn Frazier generally feels fine.  She is very closely followed by Dr. Polly Cobia.  She had many questions regarding osteopenia, osteoporosis, T-scores, and a bone density scan.   COVID 19 VACCINATION STATUS: Received Moderna vaccine   BREAST CANCER HISTORY: From the original intake note:   "Autumn Frazier" had  routine screening mammography with tomography of the [facility] 10/31/2015. A possible mass associated with calcifications was noted in the left breast. Accordingly the patient proceeded to digital diagnostic left mammography and ultrasonography the same day. Breast density was category B. In the left breast that was a small ill-defined mass at approximately 4:00 measuring 7 mm mammographically. Also there was a small group of heterogeneous calcifications in a linear configuration in the upper outer quadrant of the left breast, at the 12:30 o'clock position. The calcifications spanned a 0.4 cm. There were approximately 3 cm superior to the mass. There was a second group of heterogeneous calcifications approximately 1 cm posterior and lateral to the mass. These spanned 1.1 cm. The distance between both groups of calcifications (with a mass in between) was 4.5 cm.  I examined her was no palpable abnormality. By ultrasonography there was a small irregular hypoechoic mass at the 3:30 o'clock position of the left breast measuring 0.6 cm maximally. Ultrasound of the axilla was unremarkable.  On the same day the patient underwent biopsy of the left breast mass in question and also the calcifications at the 12:30 o'clock position. The breast mass ([id number]) proved to be an invasive ductal carcinoma, grade 1, estrogen receptor 100% positive, progesterone receptor 30% positive, both with strong staining intensity, with an MIB-1 of 10%, and no HER-2/neu amplification, the signals ratio being 1.50 and the number Purcell 2.25.  The area of calcifications at 12:30 o'clock showed usual ductal hyperplasia without atypia.  On 11/06/2015 Autumn Frazier underwent biopsy of a second area of calcifications less than 2 cm away from the primary mass and this showed (  S [id number]) atypical ductal hyperplasia.  ENDOMETRIAL CANCER HISTORY: From follow up note on 06/30/2017:  She had some spotting, brought this to medical attention,  and had an endometrial biopsy 03/11/2017 which was nondiagnostic, but transvaginal ultrasound at the same time showed a fluid filled endometrial cavity and a 3.6 cm hyperechoic mass on the posterior wall. There was also a separate 1.17 m hyperechoic mass on the anterior wall. Neither of these were vascular. Altogether the endometrium measured 4.35 cm. There was also a questionable cervical mass.  The patient was referred to Dr. Polly Cobia and after appropriate discussion underwent robotic hysterectomy with bilateral salpingo-oophorectomy and sentinel lymph node sampling on 04/01/2017. The final pathology (S and [id number]) showed 2 areas of high-grade endometrial adenocarcinoma, serous type, measuring 2.5 and 1.5 cm, with no definitive myometrial invasion seen. The ovaries and fallopian tubes were benign. However 2 right obturator lymph nodes and 2 left obturator lymph nodes were all involved by disease.  Additional tests on the tumor tissue found retained nuclear expression for all 4 mismatch repair proteins tested (ML H1, PMS 2, [place] 2, MSH 6) and estrogen receptor positivity in the week intermediate range at 90%, with less than 1% progesterone receptor, but HER-2/neu 3+ by immunohistochemistry.  She was treated with IMRT and HDR radiation treatments under Dr. Sondra Come followed by Herceptin under Dr. Polly Cobia.  Subsequent history is as detailed below.   PAST MEDICAL HISTORY: Past Medical History:  Diagnosis Date   Breast cancer ([place])    Breast cancer of lower-outer quadrant of left female breast ([place]) 11/02/2015   Endometrial cancer ([facility])    History of brachytherapy 08/27/17-09/11/17   vaginal cuff 18 Gy in 3 fractions   History of radiation therapy 07/14/2017-08/19/17   pelvis 45 Gy in 25 fractions   Hx of radiation therapy 01/09/16- 02/06/16   Left Breast   Personal history of chemotherapy 04/2017   for uterine ca   Personal history of radiation therapy 2017   for breast ca   Personal history of  radiation therapy 2018   for uterine ca    PAST SURGICAL HISTORY: Past Surgical History:  Procedure Laterality Date   BREAST BIOPSY Left 11/06/2015   benign   BREAST BIOPSY Left 10/31/2015   malignant   BREAST BIOPSY Left 10/31/2015   benign   BREAST BIOPSY Left 10/15/2013   benign   BREAST LUMPECTOMY Left    herniated disc     RADIOACTIVE SEED GUIDED PARTIAL MASTECTOMY WITH AXILLARY SENTINEL LYMPH NODE BIOPSY Left 11/27/2015   Procedure: LEFT BREAST SEED AND WIRE GUIDED LUMPECTOMY WITH LEFT AXILLARY LYMPH NODE BIOPSY;  Surgeon: Rolm Bookbinder, MD;  Location: [place];  Service: General;  Laterality: Left;   ROBOTIC ASSISTED TOTAL HYSTERECTOMY WITH BILATERAL SALPINGO OOPHERECTOMY  04/01/2017   ROBOTIC HYSTERECTOMY WITH BSO, SENTINEL NODE MAPPING AND D&C by Dr. Polly Cobia   TONSILLECTOMY      FAMILY HISTORY Family History  Problem Relation Age of Onset   Brain cancer Sister    Ovarian cancer Maternal Aunt    Breast cancer Other 28  the patient's father died from heart disease at the age of 107. The patient's mother died from heart failure at the age of 41. The patient had one brother who was shot in an accident at the age of 34. She has 1 sister. On the mother's side and aunt at age 28 was diagnosed with ovarian cancer. The patient's own sister was diagnosed with a brain tumor at the age  of 91 and died 9 years later.   GYNECOLOGIC HISTORY:  No LMP recorded. Patient is postmenopausal. Menarche age 60, first live birth age 21, the patient is [place] P3. She stopped having periods in 1997. She never used hormone replacement. She never used oral contraceptives.   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 Her husband Silvestre Moment worked in [employer] fund[employer] for many years and then started the [employer] here in town. He passed away Sep 30, 2018. Daughter CHELLE CAYTON is a homemaker in [place]. Son BRITTANIA SUDBECK the third lives in [place] were  he works in [place] for [employer]. Daughter Blane Ohara lives in [place] where she works as an Immunologist. The patient has 10 grandchildren (7 blood and 3 step). She attends [employer]: In place; the patient believes her son has been named as her healthcare power of attorney.  She will confirm and let us know next visit   HEALTH MAINTENANCE: Social History   Tobacco Use   Smoking status: Former    Types: Cigarettes    Quit date: 07/02/1982    Years since quitting: 38.8   Smokeless tobacco: Never  Vaping Use   Vaping Use: Never used  Substance Use Topics   Alcohol use: Yes    Alcohol/week: 3.0 standard drinks    Types: 3 Glasses of wine per week   Drug use: No    Colonoscopy: 2011?  PAP:  Bone density: remote  Lipid panel:  No Known Allergies  Current Outpatient Medications  Medication Sig Dispense Refill   acetaminophen (TYLENOL) 500 MG tablet Take 1,000 mg by mouth every 6 (six) hours as needed for moderate pain.      anastrozole (ARIMIDEX) 1 MG tablet Take 1 tablet (1 mg total) by mouth daily. 90 tablet 4   calcium carbonate (OS-CAL) 600 MG TABS tablet Take 600 mg by mouth 2 (two) times daily with a meal.      cholecalciferol (VITAMIN D) 1000 units tablet Take 1,000 Units by mouth daily.     escitalopram (LEXAPRO) 20 MG tablet Take 20 mg by mouth daily.   11   HYDROcodone-acetaminophen (NORCO/VICODIN) 5-325 MG tablet Take 1-2 tablets by mouth every 6 (six) hours as needed. (Patient not taking: Reported on 03/20/2020) 15 tablet 0   levothyroxine (SYNTHROID, LEVOTHROID) 88 MCG tablet Take 88 mcg by mouth daily before breakfast.   11   ondansetron (ZOFRAN) 4 MG tablet Take 1 tablet (4 mg total) by mouth every 8 (eight) hours as needed for nausea or vomiting. (Patient not taking: Reported on 03/20/2020) 20 tablet 1   Zinc 50 MG TABS Take 50 mg by mouth daily.     No current facility-administered medications  for this visit.   Facility-Administered Medications Ordered in Other Visits  Medication Dose Route Frequency Provider Last Rate Last Admin   sodium chloride flush (NS) 0.9 % injection 10 mL  10 mL Intravenous PRN Gery Pray, MD   10 mL at 09/09/17 1351    Objective:  white woman who appears younger than stated age  28:   05/14/21 1005  BP: (!) 149/83  Pulse: 71  Resp: 18  Temp: 97.7 F (36.5 C)  SpO2: 98%    Body mass index is 27.62 kg/m.  . Wt Readings from Last 3 Encounters:  05/14/21 171 lb 1.6 oz (77.6 kg)  04/17/20 167 lb 11.2 oz (76.1 kg)  03/20/20 165 lb (74.8  kg)   ECOG: 1 - Symptomatic but completely ambulatory  No physical exam today because of COVID restrictions  LAB RESULTS:  CMP  Recent Results (from the past 2160 hour(s))  CMP ([place] only)     Status: Abnormal   Collection Time: 05/14/21  9:30 AM  Result Value Ref Range   Sodium 143 135 - 145 mmol/L   Potassium 4.0 3.5 - 5.1 mmol/L   Chloride 108 98 - 111 mmol/L   CO2 27 22 - 32 mmol/L   Glucose, Bld 72 70 - 99 mg/dL    Comment: Glucose reference range applies only to samples taken after fasting for at least 8 hours.   BUN 14 8 - 23 mg/dL   Creatinine 1.04 (H) 0.44 - 1.00 mg/dL   Calcium 9.4 8.9 - 10.3 mg/dL   Total Protein 6.4 (L) 6.5 - 8.1 g/dL   Albumin 3.5 3.5 - 5.0 g/dL   AST 15 15 - 41 U/L   ALT 14 0 - 44 U/L   Alkaline Phosphatase 85 38 - 126 U/L   Total Bilirubin 0.3 0.3 - 1.2 mg/dL   GFR, Estimated 54 (L) >60 mL/min    Comment: (NOTE) Calculated using the CKD-EPI Creatinine Equation (2021)    Anion gap 8 5 - 15    Comment: Performed at [facility], [place] [address] [place], [place] [address]  CBC with Differential ([place] Only)     Status: Abnormal   Collection Time: 05/14/21  9:30 AM  Result Value Ref Range   WBC Count 3.8 (L) 4.0 - 10.5 K/uL   RBC 3.75 (L) 3.87 - 5.11 MIL/uL   Hemoglobin 11.3 (L) 12.0 - 15.0 g/dL   HCT 35.7 (L) 36.0  - 46.0 %   MCV 95.2 80.0 - 100.0 fL   MCH 30.1 26.0 - 34.0 pg   MCHC 31.7 30.0 - 36.0 g/dL   RDW 14.8 11.5 - 15.5 %   Platelet Count 216 150 - 400 K/uL   nRBC 0.0 0.0 - 0.2 %   Neutrophils Relative % 56 %   Neutro Abs 2.1 1.7 - 7.7 K/uL   Lymphocytes Relative 17 %   Lymphs Abs 0.7 0.7 - 4.0 K/uL   Monocytes Relative 10 %   Monocytes Absolute 0.4 0.1 - 1.0 K/uL   Eosinophils Relative 17 %   Eosinophils Absolute 0.7 (H) 0.0 - 0.5 K/uL   Basophils Relative 0 %   Basophils Absolute 0.0 0.0 - 0.1 K/uL   Immature Granulocytes 0 %   Abs Immature Granulocytes 0.01 0.00 - 0.07 K/uL    Comment: Performed at [facility], [place] [address] [place], [place] [address]     No results found for: TOTALPROTELP, ALBUMINELP, A1GS, A2GS, BETS, BETA2SER, GAMS, MSPIKE, SPEI  No results found for: TOTALPROTELP, ALBUMINELP, A2GS, BETS, BETA2SER, GAMS, MSPIKE, SPEI  Urinalysis    Component Value Date/Time   COLORURINE RED BIOCHEMICALS MAY BE AFFECTED BY COLOR (A) 04/26/2008 0455   APPEARANCEUR CLOUDY (A) 04/26/2008 0455   LABSPEC 1.021 04/26/2008 0455   PHURINE 7.0 04/26/2008 0455   GLUCOSEU NEGATIVE 04/26/2008 0455   HGBUR LARGE (A) 04/26/2008 0455   BILIRUBINUR NEGATIVE 04/26/2008 0455   KETONESUR 15 (A) 04/26/2008 0455   PROTEINUR 30 (A) 04/26/2008 0455   UROBILINOGEN 1.0 04/26/2008 0455   NITRITE NEGATIVE 04/26/2008 0455   LEUKOCYTESUR MODERATE (A) 04/26/2008 0455    STUDIES: DG BONE DENSITY (DXA)  Result Date: 05/01/2021 EXAM: DUAL  X-RAY ABSORPTIOMETRY (DXA) FOR BONE MINERAL DENSITY IMPRESSION: Referring Physician:  Chauncey Cruel Your patient completed a bone mineral density test using GE Lunar iDXA system (analysis version: 16). Technologist: [place] PATIENT: Name: Autumn Frazier, Autumn Frazier Patient ID: [id number] Birth Date: [birth year] Height: 63.5 in. Sex: Female Measured: 05/01/2021 Weight: 171.0 lbs. Indications: Advanced Age, Anastrazole, Bilateral Ovariectomy (65.51), Breast  Cancer History, Depression, Estrogen Deficient, Hysterectomy, Levothyroxine, Lexapro, Postmenopausal, Secondary Osteoporosis, Synthroid, Thyroid Disease, Uterine Cancer, History of Fracture (Adult) (V15.51) Fractures: Sacrum Treatments: Calcium (E943.0), Vitamin D (E933.5) ASSESSMENT: The BMD measured at Femur Neck Right is 0.716 g/cm2 with a T-score of -2.3. This patient is considered osteopenic/low bone mass according to [facility]) criteria. The quality of the exam is good. L2, L3 was excluded due to degenerative changes. Site Region Measured Date Measured Age [age]A BMD Significant CHANGE T-score AP Spine L1-L4 (L2,L3) 05/01/2021 81.5 -2.2 0.897 g/cm2 AP Spine L1-L4 (L2,L3) 12/04/2015 76.1 -1.9 0.952 g/cm2 DualFemur Neck Right 05/01/2021 81.5 -2.3 0.716 g/cm2 * DualFemur Neck Right 12/04/2015 76.1 -1.8 0.788 g/cm2 DualFemur Total Mean 05/01/2021 81.5 -2.0 0.752 g/cm2 * DualFemur Total Mean 12/04/2015 76.1 -1.5 0.823 g/cm2 World Health Organization [facility]) criteria for post-menopausal, Caucasian Women: Normal       T-score at or above -1 SD Osteopenia   T-score between -1 and -2.5 SD Osteoporosis T-score at or below -2.5 SD RECOMMENDATION: 1. All patients should optimize calcium and vitamin D intake. 2. Consider FDA-approved medical therapies in postmenopausal women and men aged 26 years and older, based on the following: a. A hip or vertebral (clinical or morphometric) fracture. b. T-score = -2.5 at the femoral neck or spine after appropriate evaluation to exclude secondary causes. c. Low bone mass (T-score between -1.0 and -2.5 at the femoral neck or spine) and a 10-year probability of a hip fracture = 3% or a 10-year probability of a major osteoporosis-related fracture = 20% based on the [place]-adapted WHO algorithm. d. Clinician judgment and/or patient preferences may indicate treatment for people with 10-year fracture probabilities above or below these levels. FOLLOW-UP: Patients with diagnosis of  osteoporosis or at high risk for fracture should have regular bone mineral density tests.? Patients eligible for Medicare are allowed routine testing every 2 years.? The testing frequency can be increased to one year for patients who have rapidly progressing disease, are receiving or discontinuing medical therapy to restore bone mass, or have additional risk factors. I have reviewed this study and agree with the findings. [facility] FRAX* 10-year Probability of Fracture Based on femoral neck BMD: DualFemur (Right) Major Osteoporotic Fracture: 24.5% Hip Fracture:                7.6% Population:                  [place] (Caucasian) Risk Factors: Secondary Osteoporosis, History of Fracture (Adult) (V15.51) *FRAX is a Materials engineer of the [employer] for Metabolic Bone Disease, a [place] (WHO) [facility]. ASSESSMENT: The probability of a major osteoporotic fracture is 24.5% within the next ten years. The probability of a hip fracture is 7.6% within the next ten years. I have reviewed this report and agree with the above findings. Mark A. Thornton Papas, M.D. [facility] Electronically Signed   By: Lavonia Dana M.D.   On: 05/01/2021 14:28     ASSESSMENT: 80 y.o. [place] woman status post left breast lower outer quadrant biopsy 10/31/2015 for a clinical T1b N0, stage IA  invasive ductal carcinoma, grade 1, estrogen and progesterone receptor positive, HER-2 nonamplified, with an MIB-1 of 10%  (a) biopsy of an area of calcifications 10/31/2015 showed usual ductal hyperplasia  (b) biopsy of a second area of calcifications 1.8 cm from the breast mass 11/06/2015 showed atypical ductal hyperplasia  (1) status post left lumpectomy 11/27/2015 for a pT2 cN0, stage IIA invasive ductal carcinoma, grade 1, repeat HER-2 again negative.  (2) adjuvant radiation 01-09-16 to  02-06-16:   Left Breast / 40.05 Gy in 15 fractions, Left Breast Boost / 10 Gy in 5  fractions  (3) anastrozole started 03/18/2016, completing five+ yeasrs in August 2022  (a) bone density 12/04/2015 shows a T score of -1.9  (4) the patient opted against genetic testing  ENDOMETRIAL CARCINOMA: (5) endometrial carcinoma, high grade,  pT1a pN1, status post hysterectomy with bilateral salpingo-oophorectomy 04/01/2017  (a) adjuvant chemotherapy with carboplatin, paclitaxel, and trastuzumab started 04/24/2017  (b) adjuvant radiation    1. 07/14/2017 - 08/19/2017: Pelvis / 45 Gy in 25 fractions   2. 08/27/2017, 09/04/2017, 09/11/2017: Brachytherapy Boost to vaginal cuff / 18 Gy in 3 fractions  (c) continuing trastuzumab every 3 months  (6) isolated endometrial cancer recurrence noted December 2020 in the left periaortic region, status post radiation Radiation Treatment Dates: 10/07/2019 through 11/16/2019 Site Technique Total Dose (Gy) Dose per Fx (Gy) Completed Fx Beam Energies  Abdomen: Abd IMRT 56/56 2 28/28 6X    (a) follow-up with scans, labs and exam per Dr Polly Cobia    PLAN: Autumn Frazier is now a little over 5 years out from definitive surgery for her breast cancer with no evidence of disease recurrence.  This is very favorable.  She is completing 5 years of anastrozole.  She understands that continuing it beyond 5 years provides very minimal benefit and may worsen her problem with osteopenia.  We did discuss her bone density results in detail.  We may initiated a discussion of bisphosphonates.  She is going to make an appointment with Dr. Inda Merlin her primary care physician to discuss bisphosphonates.  Of course that she needs continuing follow-up for endometrial cancer by Dr. Polly Cobia is on that and sees the patient every 3 months with scans labs and exam  At this point I feel comfortable releasing Autumn Frazier to her primary care physicians.  All she will need in terms of breast cancer follow-up is her yearly mammography and a yearly physician breast exam  We will be glad to see her  again at any point in the future if and when the need arises but as of now we are making no further routine appointments for her here.  Total encounter time 25 minutes.Chauncey Cruel, MD  05/14/21 10:18 AM  Medical Oncology and Hematology [facility] [place], [place] [address] Tel. [phone]    Fax. [phone]   I, Wilburn Mylar, am acting as scribe for Dr. Virgie Dad. Kani Jobson.  I, Lurline Del MD, have reviewed the above documentation for accuracy and completeness, and I agree with the above.   *Total Encounter Time as defined by the Centers for Medicare and Medicaid Services includes, in addition to the face-to-face time of a patient visit (documented in the note above) non-face-to-face time: obtaining and reviewing outside history, ordering and reviewing medications, tests or procedures, care coordination (communications with other health care professionals or caregivers) and documentation in the medical record.

## 2021-05-14 ENCOUNTER — Inpatient Hospital Stay: Payer: Medicare HMO | Attending: Oncology

## 2021-05-14 ENCOUNTER — Inpatient Hospital Stay: Payer: Medicare HMO | Admitting: Oncology

## 2021-05-14 ENCOUNTER — Other Ambulatory Visit: Payer: Self-pay

## 2021-05-14 VITALS — BP 149/83 | HR 71 | Temp 97.7°F | Resp 18 | Ht 66.0 in | Wt 171.1 lb

## 2021-05-14 DIAGNOSIS — C50512 Malignant neoplasm of lower-outer quadrant of left female breast: Secondary | ICD-10-CM | POA: Diagnosis not present

## 2021-05-14 DIAGNOSIS — Z170 Estrogen receptor positive status [ER+]: Secondary | ICD-10-CM

## 2021-05-14 DIAGNOSIS — C50412 Malignant neoplasm of upper-outer quadrant of left female breast: Secondary | ICD-10-CM | POA: Diagnosis not present

## 2021-05-14 DIAGNOSIS — Z8542 Personal history of malignant neoplasm of other parts of uterus: Secondary | ICD-10-CM | POA: Insufficient documentation

## 2021-05-14 DIAGNOSIS — Z79899 Other long term (current) drug therapy: Secondary | ICD-10-CM | POA: Insufficient documentation

## 2021-05-14 LAB — CBC WITH DIFFERENTIAL (CANCER CENTER ONLY)
Abs Immature Granulocytes: 0.01 10*3/uL (ref 0.00–0.07)
Basophils Absolute: 0 10*3/uL (ref 0.0–0.1)
Basophils Relative: 0 %
Eosinophils Absolute: 0.7 10*3/uL — ABNORMAL HIGH (ref 0.0–0.5)
Eosinophils Relative: 17 %
HCT: 35.7 % — ABNORMAL LOW (ref 36.0–46.0)
Hemoglobin: 11.3 g/dL — ABNORMAL LOW (ref 12.0–15.0)
Immature Granulocytes: 0 %
Lymphocytes Relative: 17 %
Lymphs Abs: 0.7 10*3/uL (ref 0.7–4.0)
MCH: 30.1 pg (ref 26.0–34.0)
MCHC: 31.7 g/dL (ref 30.0–36.0)
MCV: 95.2 fL (ref 80.0–100.0)
Monocytes Absolute: 0.4 10*3/uL (ref 0.1–1.0)
Monocytes Relative: 10 %
Neutro Abs: 2.1 10*3/uL (ref 1.7–7.7)
Neutrophils Relative %: 56 %
Platelet Count: 216 10*3/uL (ref 150–400)
RBC: 3.75 MIL/uL — ABNORMAL LOW (ref 3.87–5.11)
RDW: 14.8 % (ref 11.5–15.5)
WBC Count: 3.8 10*3/uL — ABNORMAL LOW (ref 4.0–10.5)
nRBC: 0 % (ref 0.0–0.2)

## 2021-05-14 LAB — CMP (CANCER CENTER ONLY)
ALT: 14 U/L (ref 0–44)
AST: 15 U/L (ref 15–41)
Albumin: 3.5 g/dL (ref 3.5–5.0)
Alkaline Phosphatase: 85 U/L (ref 38–126)
Anion gap: 8 (ref 5–15)
BUN: 14 mg/dL (ref 8–23)
CO2: 27 mmol/L (ref 22–32)
Calcium: 9.4 mg/dL (ref 8.9–10.3)
Chloride: 108 mmol/L (ref 98–111)
Creatinine: 1.04 mg/dL — ABNORMAL HIGH (ref 0.44–1.00)
GFR, Estimated: 54 mL/min — ABNORMAL LOW (ref >60)
Glucose, Bld: 72 mg/dL (ref 70–99)
Potassium: 4 mmol/L (ref 3.5–5.1)
Sodium: 143 mmol/L (ref 135–145)
Total Bilirubin: 0.3 mg/dL (ref 0.3–1.2)
Total Protein: 6.4 g/dL — ABNORMAL LOW (ref 6.5–8.1)

## 2021-06-06 DIAGNOSIS — G629 Polyneuropathy, unspecified: Secondary | ICD-10-CM | POA: Diagnosis not present

## 2021-06-06 DIAGNOSIS — Z23 Encounter for immunization: Secondary | ICD-10-CM | POA: Diagnosis not present

## 2021-06-06 DIAGNOSIS — M8580 Other specified disorders of bone density and structure, unspecified site: Secondary | ICD-10-CM | POA: Diagnosis not present

## 2021-06-06 DIAGNOSIS — E039 Hypothyroidism, unspecified: Secondary | ICD-10-CM | POA: Diagnosis not present

## 2021-06-21 DIAGNOSIS — C541 Malignant neoplasm of endometrium: Secondary | ICD-10-CM | POA: Diagnosis not present

## 2021-06-21 DIAGNOSIS — Z95828 Presence of other vascular implants and grafts: Secondary | ICD-10-CM | POA: Diagnosis not present

## 2021-07-24 DIAGNOSIS — Z803 Family history of malignant neoplasm of breast: Secondary | ICD-10-CM | POA: Diagnosis not present

## 2021-07-24 DIAGNOSIS — C541 Malignant neoplasm of endometrium: Secondary | ICD-10-CM | POA: Diagnosis not present

## 2021-07-24 DIAGNOSIS — Z853 Personal history of malignant neoplasm of breast: Secondary | ICD-10-CM | POA: Diagnosis not present

## 2021-08-23 DIAGNOSIS — F32A Depression, unspecified: Secondary | ICD-10-CM | POA: Diagnosis not present

## 2021-08-23 DIAGNOSIS — G629 Polyneuropathy, unspecified: Secondary | ICD-10-CM | POA: Diagnosis not present

## 2021-08-23 DIAGNOSIS — C541 Malignant neoplasm of endometrium: Secondary | ICD-10-CM | POA: Diagnosis not present

## 2021-08-24 DIAGNOSIS — S61411A Laceration without foreign body of right hand, initial encounter: Secondary | ICD-10-CM | POA: Diagnosis not present

## 2021-08-31 DIAGNOSIS — Z23 Encounter for immunization: Secondary | ICD-10-CM | POA: Diagnosis not present

## 2021-08-31 DIAGNOSIS — S61411D Laceration without foreign body of right hand, subsequent encounter: Secondary | ICD-10-CM | POA: Diagnosis not present

## 2021-08-31 DIAGNOSIS — L089 Local infection of the skin and subcutaneous tissue, unspecified: Secondary | ICD-10-CM | POA: Diagnosis not present

## 2021-08-31 DIAGNOSIS — S6991XD Unspecified injury of right wrist, hand and finger(s), subsequent encounter: Secondary | ICD-10-CM | POA: Diagnosis not present

## 2021-09-06 DIAGNOSIS — Z4889 Encounter for other specified surgical aftercare: Secondary | ICD-10-CM | POA: Diagnosis not present

## 2021-09-06 DIAGNOSIS — L03113 Cellulitis of right upper limb: Secondary | ICD-10-CM | POA: Diagnosis not present

## 2021-09-06 DIAGNOSIS — E039 Hypothyroidism, unspecified: Secondary | ICD-10-CM | POA: Diagnosis not present

## 2021-10-04 DIAGNOSIS — Z95828 Presence of other vascular implants and grafts: Secondary | ICD-10-CM | POA: Diagnosis not present

## 2021-10-04 DIAGNOSIS — C541 Malignant neoplasm of endometrium: Secondary | ICD-10-CM | POA: Diagnosis not present

## 2021-10-12 DIAGNOSIS — H52203 Unspecified astigmatism, bilateral: Secondary | ICD-10-CM | POA: Diagnosis not present

## 2021-10-12 DIAGNOSIS — H47323 Drusen of optic disc, bilateral: Secondary | ICD-10-CM | POA: Diagnosis not present

## 2021-10-12 DIAGNOSIS — Z961 Presence of intraocular lens: Secondary | ICD-10-CM | POA: Diagnosis not present

## 2021-10-12 DIAGNOSIS — H5340 Unspecified visual field defects: Secondary | ICD-10-CM | POA: Diagnosis not present

## 2021-11-13 DIAGNOSIS — Z95828 Presence of other vascular implants and grafts: Secondary | ICD-10-CM | POA: Diagnosis not present

## 2021-11-13 DIAGNOSIS — C541 Malignant neoplasm of endometrium: Secondary | ICD-10-CM | POA: Diagnosis not present

## 2021-12-18 ENCOUNTER — Other Ambulatory Visit: Payer: Self-pay | Admitting: Internal Medicine

## 2021-12-18 DIAGNOSIS — R12 Heartburn: Secondary | ICD-10-CM

## 2021-12-19 ENCOUNTER — Ambulatory Visit
Admission: RE | Admit: 2021-12-19 | Discharge: 2021-12-19 | Disposition: A | Discharge status: Home / Self Care | Payer: Medicare HMO | Source: Ambulatory Visit | Attending: Internal Medicine | Admitting: Internal Medicine

## 2021-12-19 DIAGNOSIS — K224 Dyskinesia of esophagus: Secondary | ICD-10-CM | POA: Diagnosis not present

## 2021-12-19 DIAGNOSIS — R12 Heartburn: Secondary | ICD-10-CM

## 2021-12-19 DIAGNOSIS — K219 Gastro-esophageal reflux disease without esophagitis: Secondary | ICD-10-CM | POA: Diagnosis not present

## 2021-12-27 DIAGNOSIS — Z95828 Presence of other vascular implants and grafts: Secondary | ICD-10-CM | POA: Diagnosis not present

## 2021-12-27 DIAGNOSIS — C541 Malignant neoplasm of endometrium: Secondary | ICD-10-CM | POA: Diagnosis not present

## 2022-01-22 DIAGNOSIS — K5730 Diverticulosis of large intestine without perforation or abscess without bleeding: Secondary | ICD-10-CM | POA: Diagnosis not present

## 2022-01-22 DIAGNOSIS — K4090 Unilateral inguinal hernia, without obstruction or gangrene, not specified as recurrent: Secondary | ICD-10-CM | POA: Diagnosis not present

## 2022-01-22 DIAGNOSIS — C541 Malignant neoplasm of endometrium: Secondary | ICD-10-CM | POA: Diagnosis not present

## 2022-01-22 DIAGNOSIS — J8410 Pulmonary fibrosis, unspecified: Secondary | ICD-10-CM | POA: Diagnosis not present

## 2022-01-22 DIAGNOSIS — N281 Cyst of kidney, acquired: Secondary | ICD-10-CM | POA: Diagnosis not present

## 2022-01-24 DIAGNOSIS — C541 Malignant neoplasm of endometrium: Secondary | ICD-10-CM | POA: Diagnosis not present

## 2022-01-24 NOTE — Progress Notes (Signed)
 Referring Physician: Derald Massing, NP  Chief Complaint: Stage IIIC uterine serous carcinoma of the uterus, follow-up.[name]  History of Present Illness: Autumn [place]  Frazier is a 81 y.o. 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    Her tumor history is as follows: Initiation of Taxol  carbo and Herceptin  beginning on April 24, 2017. Continue with single agent Herceptin  during radiation treatment. Completion of External beam XRT with vaginal cuff brachytherapy in December of 2018 Resume Cycle #4 of Taxol /Caroplatin/Herceptin  September 18, 2017 with completion of cycle #6 in February 2019 Continuation of maintenance Herceptin  beginning December 04, 2017  with completion of 28 cycles in December 2020 Progressive disease in the left periauricular region noted on CT imaging in December 2020 confirmed on PET imaging Radiation Treatment Dates: 10/07/2019 through 11/16/2019;  Site Technique Total Dose (Gy) Dose per Fx (Gy) Completed Fx Beam Energies; Abdomen: Abd IMRT 56/56 2 28/28 6X   She has done well since completion of her radiation therapy and has demonstrated no evidence of recurrent disease.  A repeat CT scan of the chest abdomen and pelvis was performed on Jan 22, 2022.  I have independently reviewed the scan and its results and no evidence of recurrent disease is noted.  She presents today for routine follow-up evaluation.  Review of Systems:  On review of systems, Autumn Frazier reports that she continues to do exceptionally well.  She offers no significant complaints at this time.  She continues to have some issues with her neuropathy.  This is not changed per se but is still persistent.  She is also been developing symptoms from her hiatal hernia and has been started on a proton pump inhibitor.  She does have some chronic urinary leakage but this is  not new for her.  Outside of this, she denies any abdominal bloating or swelling.  She denies any abdominal or pelvic pain.  She reports no vaginal bleeding or discharge.  She reports that her weight is stable.  Appetite is good.  No other complaints verbalized.   Past Medical History: 1. Hypertension  2. Hypothyroidism 3. Fibrocystic breast disease 4. Cervical degenerative disc disease 5. Left breast cancer, stage 1  - medical oncologist is Dr. Sandria Gell.  She had radiation therapy completed in June 2017.  - on letrozole since October 2017. 6.  Stage IIIC serous carcinoma of the uterus Initiation of Taxol  carbo and Herceptin  beginning on April 24, 2017. Continue with single agent Herceptin  during radiation treatment. Completion of External beam XRT with vaginal cuff brachytherapy in December of 2018 Resume Cycle #4 of Taxol /Caroplatin/Herceptin  September 18, 2017 with completion of cycle #6 in February 2019 Continuation of maintenance Herceptin  beginning December 04, 2017  with completion of 28 cycles in December 2020 Progressive disease in the left periauricular region noted on CT imaging in December 2020 confirmed on PET imaging Radiation Treatment Dates: 10/07/2019 through 11/16/2019;  Site Technique Total Dose (Gy) Dose per Fx (Gy) Completed Fx Beam Energies; Abdomen: Abd IMRT 56/56 2 28/28 6X   OB/GYN History: G3 P3 with 3 vaginal deliveries.  Menarche in her early teens.  Menopause at age 54.  She never took HRT.  Last Pap test was in October 2013 and was with normal cytology and was HPV negative.  She denies a history of abnormal Paps.  No other history of gynecologic issues.  Past Surgical History: 1. Left breast lumpectomy in March 2017 2. Back surgery greater than 30 years ago 3.  Robotic hyst/bso/sentinel nodes July 2018  Health Maintenance:  Patient last had a mammogram in January 2018 and it was normal.  Her last colonoscopy was 5 years ago with normal findings.  Family  History: 1. Maternal aunt with ovarian cancer, diagnosed at age 80  Social History:  Patient is married.  She and her husband live in [place].  She si a housewife.  She denies tobacco or recreational drug use.  She consumes alcohol socially.  Medications: Current Home Medications   Medication Sig  ADVAIR HFA 45-21 MCG/ACT inhaler   anastrozole  (ARIMIDEX ) 1 MG tablet Take one tablet (1 mg dose) by mouth every evening.  anastrozole  (ARIMIDEX ) 1 MG tablet Take one tablet (1 mg dose) by mouth.  aspirin 81 mg chewable tablet Chew one tablet (81 mg dose) by mouth every evening.  b complex vitamins tablet Take one tablet by mouth every morning.  Biotin 5000 MCG TABS Take one tablet by mouth every morning.  calcium carbonate (OS-CAL) 600 MG TABS Take one tablet (600 mg dose) by mouth 2 (two) times daily with meals.  Cholecalciferol (VITAMIN D-3) 1000 units CAPS Take 1 tablet by mouth every morning.  Cholecalciferol 25 MCG (1000 UT) tablet Take one tablet (1,000 Units dose) by mouth daily.  diclofenac  sodium (VOLTAREN ) 75 mg EC tablet Take one tablet (75 mg dose) by mouth.  diphenhydrAMINE  HCl, Sleep, 50 MG CAPS Take 1 tablet by mouth every evening.  escitalopram oxalate (LEXAPRO) 20 mg tablet Take one tablet (20 mg dose) by mouth daily.  gabapentin (NEURONTIN) 300 mg capsule TAKE 1 CAPSULE(300 MG) BY MOUTH TWICE DAILY  ibuprofen (ADVIL,MOTRIN) 600 mg tablet Take one tablet (600 mg total) by mouth every 6 (six) hours as needed for Pain.  levothyroxine sodium (SYNTHROID,LEVOTHROID,LEVOXYL) 137 mcg tablet 88 mcg  LORAzepam  (ATIVAN ) 0.5 mg tablet Take one tablet (0.5 mg dose) by mouth.  melatonin 5 MG TBDP Take one tablet (5 mg dose) by mouth at bedtime.  ondansetron  (ZOFRAN ) 8 MG tablet Take one tablet (8 mg total) by mouth every 8 (eight) hours as needed for Nausea.  prochlorperazine (COMPAZINE) 10 MG tablet Take one tablet (10 mg total) by mouth every 6 (six) hours as needed.  promethazine  (PHENERGAN) 12.5 MG tablet Take one tablet (12.5 mg total) by mouth every 6 (six) hours as needed for Nausea.  Zinc Chelated 50 MG TABS Take 50 mg by mouth daily.     Allergies: Allergies  Allergen Reactions  . Aspirin Hives    Physical Exam: BP 116/75   Pulse 77   Temp 97 F (36.1 C) (Temporal)   Resp 16   Ht 5' 6 (1.676 m)   Wt 167 lb (75.8 kg)   SpO2 98%   BMI 26.95 kg/m   Pain score 0 out of 10.  GENERAL: In general, this is a pleasant Caucasian Frazier in a no acute distress EYES:: anicteric sclerae, moist conjunctivae; no lid-lag; PERRLA HENT: Atraumatic; oropharynx clear with moist mucous membranes and no mucosal ulcerations; normal hard and soft palate NECK: Trachea midline; FROM, supple, no thyromegaly or lymphadenopathy CARDIOVASCULAR: Regular rate and rhythm.  No murmurs, rubs, or gallops appreciated. PULMONARY: Clear to auscultation bilaterally, no wheezes or crackles.  ABDOMEN: Abdomen is soft, non-tender and nondistended.  No rebound or guarding is noted.  No masses, organomegaly, or fascial defects are appreciated.  PELVIC:  External genitalia:  Normal external Frazier genitalia. No lesions are seen grossly on the mons, or bilateral labia.  She has a normal BUS.               Perineum/Anus: No lesions seen.  External hemorrhoids nonthrombosed seen.             Urethra:Urethra appears normal, midline and without prolapse or gross lesion..             Bladder: No bladder prolapse is noted. No bladder tenderness is appreciated on  palpation.             Vagina:  On speculum examination, the vaginal mucosa is Atrophic and grossly normal, but does have changes consistent with prior radiation.[name] Bijou of the vagina is noted with scarring consistent with prior treatment effect no lesions are identified.  This is unchanged from prior evaluations.  Bimanual examination reveals a supple vagina without induration or nodularity.             Cervix:    Surgically absent.             Uterus:  Surgically absent.             Adnexa:  No adnexal masses or fullness appreciated. No intra-abdominal disease noted.             Parametria:  Normal rectal tone without induration or nodularity in the rectovaginal septum.             Digital Rectal Examination: Normal rectal tone without induration or nodularity of the rectovaginal septum. NODES:  No inguinal adenopathy, No supraclavicular adenopathy.  No axillary lymphadenopathy. MUSCULOSKELETAL/EXTREMITIES: Bilateral lower extremities symmetric, not tender, without edema.   SKIN: Warm, dry, well perfused.  PSYCHIATRIC:she is alert and oriented 4 and very appropriate throughout the entire evaluation. Mood is very good.   Labs: No results found for this or any previous visit (from the past 72 hour(s)).   Radiology: No results found.    A/P:  1.  Recurrent stage IIIC serous carcinoma of the uterus.  Overall, Autumn Frazier continues to do very well. Clinically, based on today's evaluation, comprehensive examination and comprehensive review of systems, there is no overt evidence of active or recurrent disease. CT scan results are reviewed with Autumn Frazier and show no evidence of recurrent disease. A CA125 is obtained for today and she is aware that we will follow-up with her once we have the results back. Unless otherwise indicated, we will plan on seeing her back in roughly 6 months for continued surveillance Autumn Frazier verbalizes an understanding the recommendations have been outlined and all questions answered.  2.  Peripheral neuropathy She is interested in pursuing some evaluation of this and will follow-up with her primary care provider for this which I think is appropriate.  3.  Depression Autumn Frazier continues to do very well on the Lexapro.   We discussed pros and cons of discontinuation at this time and she is more comfortable staying on this which is reasonable.  Documentation for time-based  billing:  Total time spent of date of service was 30 minutes.  Patient care activities included preparing to see the patient such as reviewing the patient record, obtaining and/or reviewing separately obtained history, performing a medically appropriate history and physical examination, counseling and educating the patient, family, and/or caregiver, ordering prescription medications, tests, or procedures, documenting clinical information in the electronic or other health record and independently interpreting results when not separately reported.  Autumn GEANNIE Barrio, MD  This note was dictated using voice recognition software. Similar sounding words can inadvertently get transcribed and not corrected upon review.

## 2022-02-06 DIAGNOSIS — E559 Vitamin D deficiency, unspecified: Secondary | ICD-10-CM | POA: Diagnosis not present

## 2022-02-06 DIAGNOSIS — R32 Unspecified urinary incontinence: Secondary | ICD-10-CM | POA: Diagnosis not present

## 2022-02-06 DIAGNOSIS — Z0001 Encounter for general adult medical examination with abnormal findings: Secondary | ICD-10-CM | POA: Diagnosis not present

## 2022-02-06 DIAGNOSIS — G620 Drug-induced polyneuropathy: Secondary | ICD-10-CM | POA: Diagnosis not present

## 2022-02-06 DIAGNOSIS — R252 Cramp and spasm: Secondary | ICD-10-CM | POA: Diagnosis not present

## 2022-02-06 DIAGNOSIS — C541 Malignant neoplasm of endometrium: Secondary | ICD-10-CM | POA: Diagnosis not present

## 2022-02-19 DIAGNOSIS — Z1211 Encounter for screening for malignant neoplasm of colon: Secondary | ICD-10-CM | POA: Diagnosis not present

## 2022-02-22 DIAGNOSIS — D649 Anemia, unspecified: Secondary | ICD-10-CM | POA: Diagnosis not present

## 2022-03-20 DIAGNOSIS — K5730 Diverticulosis of large intestine without perforation or abscess without bleeding: Secondary | ICD-10-CM | POA: Diagnosis not present

## 2022-03-20 DIAGNOSIS — R195 Other fecal abnormalities: Secondary | ICD-10-CM | POA: Diagnosis not present

## 2022-03-20 DIAGNOSIS — K649 Unspecified hemorrhoids: Secondary | ICD-10-CM | POA: Diagnosis not present

## 2022-03-20 DIAGNOSIS — K2930 Chronic superficial gastritis without bleeding: Secondary | ICD-10-CM | POA: Diagnosis not present

## 2022-03-20 DIAGNOSIS — K449 Diaphragmatic hernia without obstruction or gangrene: Secondary | ICD-10-CM | POA: Diagnosis not present

## 2022-03-20 DIAGNOSIS — D509 Iron deficiency anemia, unspecified: Secondary | ICD-10-CM | POA: Diagnosis not present

## 2022-03-22 DIAGNOSIS — K2930 Chronic superficial gastritis without bleeding: Secondary | ICD-10-CM | POA: Diagnosis not present

## 2022-03-25 DIAGNOSIS — Z95828 Presence of other vascular implants and grafts: Secondary | ICD-10-CM | POA: Diagnosis not present

## 2022-03-25 DIAGNOSIS — C541 Malignant neoplasm of endometrium: Secondary | ICD-10-CM | POA: Diagnosis not present

## 2022-04-01 ENCOUNTER — Ambulatory Visit: Payer: Medicare HMO

## 2022-04-10 ENCOUNTER — Ambulatory Visit: Payer: Medicare HMO

## 2022-04-10 ENCOUNTER — Ambulatory Visit
Admission: RE | Admit: 2022-04-10 | Discharge: 2022-04-10 | Disposition: A | Discharge status: Home / Self Care | Payer: Medicare HMO | Source: Ambulatory Visit | Attending: Internal Medicine | Admitting: Internal Medicine

## 2022-04-10 DIAGNOSIS — Z1231 Encounter for screening mammogram for malignant neoplasm of breast: Secondary | ICD-10-CM | POA: Diagnosis not present

## 2022-05-20 ENCOUNTER — Encounter: Payer: Self-pay | Admitting: Neurology

## 2022-05-23 DIAGNOSIS — Z95828 Presence of other vascular implants and grafts: Secondary | ICD-10-CM | POA: Diagnosis not present

## 2022-05-23 DIAGNOSIS — C541 Malignant neoplasm of endometrium: Secondary | ICD-10-CM | POA: Diagnosis not present

## 2022-05-29 DIAGNOSIS — H21231 Degeneration of iris (pigmentary), right eye: Secondary | ICD-10-CM | POA: Diagnosis not present

## 2022-05-29 DIAGNOSIS — H40011 Open angle with borderline findings, low risk, right eye: Secondary | ICD-10-CM | POA: Diagnosis not present

## 2022-05-29 DIAGNOSIS — H47323 Drusen of optic disc, bilateral: Secondary | ICD-10-CM | POA: Diagnosis not present

## 2022-07-23 NOTE — Progress Notes (Signed)
Initial neurology clinic note  SERVICE DATE: 07/26/22  Reason for Evaluation: Consultation requested by Josetta Huddle, MD for an opinion regarding neuropathy. My final recommendations will be communicated back to the requesting physician by way of shared medical record or letter to requesting physician via [place] mail.  HPI: This is Ms. Autumn Frazier, a 81 y.o. right-handed female with a medical history of breast cancer, endometrial cancer, vit D deficiency, osteoporosis, GERD, hypothyroidism, insomnia who presents to neurology clinic with the chief complaint of numbness in hands and imbalance when walking. The patient is alone.  5 years ago, patient had endometrial cancer and had chemotherapy and radiation. Shortly after chemo, she developed numbness in her hands. Within the year, she noticed symptoms in her feet. She does not notice pain in her feet, but notice her balance has been affected. She will use a cane when in public or at night, because when it is dark, she is more off balance. She usually likes to wear "cute shoes" (heels, etc) but has switched to more tennis shoe types has helped. She had one fall about 1 month ago. She was wearing flip flops and tripped over a mat.  She mentioned that about 9 months after chemo, she was walking and put her hand on a swivel chair that spun and she fell and broke her sacrum. She did not require surgery, but noticed the balance was worse after this.  Patient has previously tried gabapentin for her symptoms without significant difference.  Patient denies back or neck pain.  Patient sees Dr. Polly Cobia in oncology. Cancer history per clinic note from 01/24/22: History of Present Illness: Autumn Frazier is a 81 y.o. 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   Her tumor history is as follows:   Initiation of Taxol [place] and Herceptin beginning on April 24, 2017. Continue with single agent Herceptin during radiation treatment.  Completion of External beam XRT with vaginal cuff brachytherapy in December of 2018  Resume Cycle #4 of Taxol/Caroplatin/Herceptin September 18, 2017 with completion of cycle #6 in February 2019  Continuation of maintenance Herceptin beginning December 04, 2017 with completion of 28 cycles in December 2020  Progressive disease in the left periauricular region noted on CT imaging in December 2020 confirmed on PET imaging  Radiation Treatment Dates: 10/07/2019 through 11/16/2019; Site Technique Total Dose (Gy) Dose per Fx (Gy) Completed Fx Beam Energies; Abdomen: Abd IMRT 56/56 2 28/28 6X   She has done well since completion of her radiation therapy and has demonstrated no evidence of recurrent disease. A repeat CT scan of the chest abdomen and pelvis was performed on Jan 22, 2022.   She continues to stay active.  Of note, patient takes zinc supplementation. She also takes Lexapro 20 mg daily. She started this after her cancer diagnosis for depression. She is doing well currently and was recently offered to come off, but patient decided to continue since she was feeling well.  The patient does not report any constitutional symptoms like fever, night sweats, anorexia or unintentional weight loss.  EtOH use: wine, occasionally on weekend, never more than 2 glasses  Restrictive diet? No Family history of neuropathy/myopathy/NM disease? No  Patient has never had an EMG.  MEDICATIONS:  Outpatient Encounter Medications as of 07/26/2022  Medication Sig   calcium carbonate (OS-CAL) 600 MG  TABS tablet Take 600 mg by mouth 2 (two) times daily with a meal.    cholecalciferol (VITAMIN D) 1000 units tablet Take 1,000 Units by mouth daily.   escitalopram (LEXAPRO) 20 MG tablet Take 20 mg by mouth daily.    levothyroxine (SYNTHROID, LEVOTHROID) 88 MCG tablet Take 88 mcg by  mouth daily before breakfast.    Zinc 50 MG TABS Take 50 mg by mouth daily.   [DISCONTINUED] anastrozole (ARIMIDEX) 1 MG tablet TAKE 1 TABLET(1 MG) BY MOUTH DAILY   acetaminophen (TYLENOL) 500 MG tablet Take 1,000 mg by mouth every 6 (six) hours as needed for moderate pain.  (Patient not taking: Reported on 07/26/2022)   HYDROcodone-acetaminophen (NORCO/VICODIN) 5-325 MG tablet Take 1-2 tablets by mouth every 6 (six) hours as needed. (Patient not taking: Reported on 03/20/2020)   ondansetron (ZOFRAN) 4 MG tablet Take 1 tablet (4 mg total) by mouth every 8 (eight) hours as needed for nausea or vomiting. (Patient not taking: Reported on 03/20/2020)   Facility-Administered Encounter Medications as of 07/26/2022  Medication   sodium chloride flush (NS) 0.9 % injection 10 mL    PAST MEDICAL HISTORY: Past Medical History:  Diagnosis Date   Breast cancer ([facility])    Breast cancer of lower-outer quadrant of left female breast ([facility]) 11/02/2015   Endometrial cancer ([facility])    History of brachytherapy 08/27/17-09/11/17   vaginal cuff 18 Gy in 3 fractions   History of radiation therapy 07/14/2017-08/19/17   pelvis 45 Gy in 25 fractions   Hx of radiation therapy 01/09/16- 02/06/16   Left Breast   Personal history of chemotherapy 04/2017   for uterine ca   Personal history of radiation therapy 2017   for breast ca   Personal history of radiation therapy 2018   for uterine ca    PAST SURGICAL HISTORY: Past Surgical History:  Procedure Laterality Date   BREAST BIOPSY Left 11/06/2015   benign   BREAST BIOPSY Left 10/31/2015   malignant   BREAST BIOPSY Left 10/31/2015   benign   BREAST BIOPSY Left 10/15/2013   benign   BREAST LUMPECTOMY Left    herniated disc     RADIOACTIVE SEED GUIDED PARTIAL MASTECTOMY WITH AXILLARY SENTINEL LYMPH NODE BIOPSY Left 11/27/2015   Procedure: LEFT BREAST SEED AND WIRE GUIDED LUMPECTOMY WITH LEFT AXILLARY LYMPH NODE BIOPSY;  Surgeon: Rolm Bookbinder, MD;  Location: [facility];  Service: General;  Laterality: Left;   ROBOTIC ASSISTED TOTAL HYSTERECTOMY WITH BILATERAL SALPINGO OOPHERECTOMY  04/01/2017   ROBOTIC HYSTERECTOMY WITH BSO, SENTINEL NODE MAPPING AND D&C by Dr. Polly Cobia   TONSILLECTOMY      ALLERGIES: No Known Allergies  FAMILY HISTORY: Family History  Problem Relation Age of Onset   Brain cancer Sister    Ovarian cancer Maternal Aunt    Breast cancer Other 77    SOCIAL HISTORY: Social History   Tobacco Use   Smoking status: Former    Types: Cigarettes    Quit date: 07/02/1982    Years since quitting: 40.0   Smokeless tobacco: Never  Vaping Use   Vaping Use: Never used  Substance Use Topics   Alcohol use: Yes    Alcohol/week: 3.0 standard drinks of alcohol    Types: 3 Glasses of wine per week    Comment: occas   Drug use: No   Social History   Social History Narrative   Are you right handed or left handed? Right   Are you currently employed ? no  Caffeine one in am   Do you live at home alone?yes      What type of home do you live in: 1 story or 2 story? Two don't use second level         OBJECTIVE: PHYSICAL EXAM: BP (!) 147/74   Pulse 80   Ht '5\' 5"'$  (1.651 m)   Wt 167 lb (75.8 kg)   SpO2 98%   BMI 27.79 kg/m   General: General appearance: Awake and alert. No distress. Cooperative with exam.  Skin: No obvious rash or jaundice. HEENT: Atraumatic. Anicteric. Lungs: Non-labored breathing on room air  Psych: Affect appropriate.  Neurological: Mental Status: Alert. Speech fluent. No pseudobulbar affect Cranial Nerves: CNII: No RAPD. Visual fields grossly intact. CNIII, IV, VI: PERRL. No nystagmus. EOMI. CN V: Facial sensation intact bilaterally to fine Frazier. CN VII: Facial muscles symmetric and strong. No ptosis at rest. CN VIII: Hearing grossly intact bilaterally. CN IX: No hypophonia. CN X: Palate elevates symmetrically. CN XI: Full strength shoulder shrug bilaterally. CN XII: Tongue  protrusion full and midline. No atrophy or fasciculations. No significant dysarthria Motor: Tone is normal. No atrophy.  Individual muscle group testing (MRC grade out of 5):  Movement     Neck flexion 5    Neck extension 5     Right Left   Shoulder abduction 5 5   Shoulder adduction 5 5   Shoulder ext rotation 5 5   Shoulder int rotation 5 5   Elbow flexion 5 5   Elbow extension 5 5   Wrist extension 5 5   Wrist flexion 5 5   Finger abduction - FDI 5 5   Finger abduction - ADM 5 5   Finger extension 5 5   Finger distal flexion - 2/'3 5 5   '$ Finger distal flexion - 4/'5 5 5   '$ Thumb flexion - FPL 5 5   Thumb abduction - APB 5 5    Hip flexion 5- 5   Hip extension 5 5   Hip adduction 5 5   Hip abduction 5 5   Knee extension 5 5   Knee flexion 5 5   Dorsiflexion 5 5   Plantarflexion 5 5   Inversion 5 5   Eversion 5 5   Great toe extension 5 5   Great toe flexion 5 5     Reflexes:  Right Left   Bicep 2+ 2+   Tricep 2+ 2+   BrRad 2+ 2+   Knee 2+ 2+   Ankle 0 0    Pathological Reflexes: Babinski: flexor response bilaterally Hoffman: absent bilaterally Troemner: absent bilaterally Sensation: Vibration: Intact in upper extremities. Absent in bilateral great toes, stronger in ankles, then stronger in knees Temperature: Intact in upper extremities. Absent in feet. Improves in length dependent manner Proprioception: Diminished in bilateral great toes Coordination: Intact finger-to- nose-finger bilaterally. Romberg with mild sway. Gait: Able to rise from chair with arms crossed unassisted. Narrow base gait. Imbalance, particularly with turns.  Lab and Test Review: MRI lumbar spine w/wo contrast (11/29/17): FINDINGS: Segmentation:  Standard.   Alignment: Mild reverse S curvature with the apex at L2 and dextro apex at L5. Normal lumbar lordosis without listhesis.   Vertebrae: No fracture, evidence of discitis, or bone lesion of lumbar vertebral bodies. Fracture of  bilateral sacral ala and S1-2 segments with edema and enhancement indicating recent injury. Mild edema and surrounding soft tissues.   Conus medullaris and cauda equina: Conus extends  to the L1 level. Conus and cauda equina appear normal.   Paraspinal and other soft tissues: Multiple renal cysts bilaterally.   Disc levels:   L1-2: Mild facet hypertrophy. No significant foraminal or canal stenosis.   L2-3: Disc bulge with small right subarticular protrusion and annular fissure as well as mild facet hypertrophy. Mild bilateral foraminal and canal stenosis.   L3-4: Disc bulge eccentric to the left and tiny central extrusion with inferior migration. Mild facet hypertrophy. Mild left foraminal and lateral recess stenosis. Disc contact on descending left L4 nerve root in left lateral recess. No canal stenosis.   L4-5: Disc bulge, endplate marginal osteophytes, and mild facet hypertrophy. Mild foraminal stenosis. No significant canal stenosis.   L5-S1: Disc bulge with mild right and moderate left facet hypertrophy. Mild left foraminal stenosis. No significant canal stenosis.   IMPRESSION: 1. Fracture of bilateral sacral ala and S1/2 segments with edema and enhancement indicating recent injury. Fracture is partially visualized. 2. Mild S-shaped curvature of lumbar spine and multilevel mild spondylosis. 3. Mild L2-3 canal stenosis and left L3-4 lateral recess stenosis with contact on descending left L4 nerve root. No high-grade canal stenosis. 4. Multilevel mild foraminal stenosis. No high-grade foraminal stenosis.  ASSESSMENT: Autumn Frazier is a 81 y.o. female who presents for evaluation of numbness in feet and hands and gait imbalance. She has a relevant medical history of breast cancer, endometrial cancer, vit D deficiency, osteoporosis, GERD, hypothyroidism, insomnia. Her neurological examination is pertinent for diminished sensation in a stocking glove, length dependent fashion  and gait imbalance. Patient's symptoms started soon after treatment with Taxol/Caroplatin/Herceptin and are consistent with chemotherapy induced neuropathy. I will look for other treatable causes of neuropathy today. Fortunately, patient does not have pain and would not benefit from medication as medication would treat pain and tingling, not numbness and lack of sensation.  PLAN: -Blood work: B12, HbA1c, IFE.  -Foot care discussed  -Return to clinic in 1 year   The impression above as well as the plan as outlined below were extensively discussed with the patient who voiced understanding. All questions were answered to their satisfaction.  The patient was counseled on pertinent fall precautions per the printed material provided today, and as noted under the "Patient Instructions" section below.  When available, results of the above investigations and possible further recommendations will be communicated to the patient via telephone/MyChart. Patient to call office if not contacted after expected testing turnaround time.   Total time spent reviewing records, interview, history/exam, documentation, and coordination of care on day of encounter:  50 min   Thank you for allowing me to participate in patient's care.  If I can answer any additional questions, I would be pleased to do so.  Kai Levins, MD   CC: [name] Heck, [place], [name] [address] [place] [address]  CC: Referring provider: Josetta Huddle, MD [address] [place],  [place] [address]

## 2022-07-25 DIAGNOSIS — Z79899 Other long term (current) drug therapy: Secondary | ICD-10-CM | POA: Diagnosis not present

## 2022-07-25 DIAGNOSIS — Z08 Encounter for follow-up examination after completed treatment for malignant neoplasm: Secondary | ICD-10-CM | POA: Diagnosis not present

## 2022-07-25 DIAGNOSIS — C541 Malignant neoplasm of endometrium: Secondary | ICD-10-CM | POA: Diagnosis not present

## 2022-07-25 DIAGNOSIS — Z95828 Presence of other vascular implants and grafts: Secondary | ICD-10-CM | POA: Diagnosis not present

## 2022-07-25 DIAGNOSIS — Z90722 Acquired absence of ovaries, bilateral: Secondary | ICD-10-CM | POA: Diagnosis not present

## 2022-07-25 DIAGNOSIS — Z79811 Long term (current) use of aromatase inhibitors: Secondary | ICD-10-CM | POA: Diagnosis not present

## 2022-07-25 DIAGNOSIS — Z90710 Acquired absence of both cervix and uterus: Secondary | ICD-10-CM | POA: Diagnosis not present

## 2022-07-25 DIAGNOSIS — Z8542 Personal history of malignant neoplasm of other parts of uterus: Secondary | ICD-10-CM | POA: Diagnosis not present

## 2022-07-25 DIAGNOSIS — E039 Hypothyroidism, unspecified: Secondary | ICD-10-CM | POA: Diagnosis not present

## 2022-07-25 DIAGNOSIS — I10 Essential (primary) hypertension: Secondary | ICD-10-CM | POA: Diagnosis not present

## 2022-07-26 ENCOUNTER — Encounter: Payer: Self-pay | Admitting: Neurology

## 2022-07-26 ENCOUNTER — Ambulatory Visit: Payer: Medicare HMO | Admitting: Neurology

## 2022-07-26 ENCOUNTER — Other Ambulatory Visit (INDEPENDENT_AMBULATORY_CARE_PROVIDER_SITE_OTHER): Payer: Medicare HMO

## 2022-07-26 VITALS — BP 147/74 | HR 80 | Ht 65.0 in | Wt 167.0 lb

## 2022-07-26 DIAGNOSIS — R209 Unspecified disturbances of skin sensation: Secondary | ICD-10-CM

## 2022-07-26 DIAGNOSIS — R2681 Unsteadiness on feet: Secondary | ICD-10-CM

## 2022-07-26 DIAGNOSIS — T451X5A Adverse effect of antineoplastic and immunosuppressive drugs, initial encounter: Secondary | ICD-10-CM | POA: Diagnosis not present

## 2022-07-26 DIAGNOSIS — G620 Drug-induced polyneuropathy: Secondary | ICD-10-CM | POA: Diagnosis not present

## 2022-07-26 LAB — VITAMIN B12: Vitamin B-12: 260 pg/mL (ref 211–911)

## 2022-07-26 LAB — HEMOGLOBIN A1C: Hgb A1c MFr Bld: 5.9 % (ref 4.6–6.5)

## 2022-07-26 NOTE — Patient Instructions (Signed)
The cause of your numbness and imbalance when walking is a side effect of your chemotherapy that caused nerve damage (neuropathy).  I would like to check some labs today to look for any other things that could be contributing that we could treat. I will be in touch when I have those results.  You should check your feet daily to make sure you don't have cuts or injuries as you may not feel it.  You should be careful walking and avoid falls at all costs (see below).  I want to see you back in clinic in 1 year or sooner if needed.  The physicians and staff at [facility] are committed to providing excellent care. You may receive a survey requesting feedback about your experience at our office. We strive to receive "very good" responses to the survey questions. If you feel that your experience would prevent you from giving the office a "very good " response, please contact our office to try to remedy the situation. We may be reached at [phone]. Thank you for taking the time out of your busy day to complete the survey.  Kai Levins, MD [facility]  Preventing Falls at [facility] are common, often dreaded events in the lives of older people. Aside from the obvious injuries and even death that may result, fall can cause wide-ranging consequences including loss of independence, mental decline, decreased activity and mobility. Younger people are also at risk of falling, especially those with chronic illnesses and fatigue.  Ways to reduce risk for falling Examine diet and medications. Warm foods and alcohol dilate blood vessels, which can lead to dizziness when standing. Sleep aids, antidepressants and pain medications can also increase the likelihood of a fall.  Get a vision exam. Poor vision, cataracts and glaucoma increase the chances of falling.  Check foot gear. Shoes should fit snugly and have a sturdy, nonskid sole and a broad, low heel  Participate in a physician-approved  exercise program to build and maintain muscle strength and improve balance and coordination. Programs that use ankle weights or stretch bands are excellent for muscle-strengthening. Water aerobics programs and low-impact Tai Chi programs have also been shown to improve balance and coordination.  Increase vitamin D intake. Vitamin D improves muscle strength and increases the amount of calcium the body is able to absorb and deposit in bones.  How to prevent falls from common hazards Floors - Remove all loose wires, cords, and throw rugs. Minimize clutter. Make sure rugs are anchored and smooth. Keep furniture in its usual place.  Chairs -- Use chairs with straight backs, armrests and firm seats. Add firm cushions to existing pieces to add height.  Bathroom - Install grab bars and non-skid tape in the tub or shower. Use a bathtub transfer bench or a shower chair with a back support Use an elevated toilet seat and/or safety rails to assist standing from a low surface. Do not use towel racks or bathroom tissue holders to help you stand.  Lighting - Make sure halls, stairways, and entrances are well-lit. Install a night light in your bathroom or hallway. Make sure there is a light switch at the top and bottom of the staircase. Turn lights on if you get up in the middle of the night. Make sure lamps or light switches are within reach of the bed if you have to get up during the night.  Kitchen - Install non-skid rubber mats near the sink and stove. Clean spills immediately. Store frequently  used utensils, pots, pans between waist and eye level. This helps prevent reaching and bending. Sit when getting things out of lower cupboards.  Living room/ Bedrooms - Place furniture with wide spaces in between, giving enough room to move around. Establish a route through the living room that gives you something to hold onto as you walk.  Stairs - Make sure treads, rails, and rugs are secure. Install a rail on both  sides of the stairs. If stairs are a threat, it might be helpful to arrange most of your activities on the lower level to reduce the number of times you must climb the stairs.  Entrances and doorways - Install metal handles on the walls adjacent to the doorknobs of all doors to make it more secure as you travel through the doorway.  Tips for maintaining balance Keep at least one hand free at all times. Try using a backpack or fanny pack to hold things rather than carrying them in your hands. Never carry objects in both hands when walking as this interferes with keeping your balance.  Attempt to swing both arms from front to back while walking. This might require a conscious effort if Parkinson's disease has diminished your movement. It will, however, help you to maintain balance and posture, and reduce fatigue.  Consciously lift your feet off of the ground when walking. Shuffling and dragging of the feet is a common culprit in losing your balance.  When trying to navigate turns, use a "U" technique of facing forward and making a wide turn, rather than pivoting sharply.  Try to stand with your feet shoulder-length apart. When your feet are close together for any length of time, you increase your risk of losing your balance and falling.  Do one thing at a time. Don't try to walk and accomplish another task, such as reading or looking around. The decrease in your automatic reflexes complicates motor function, so the less distraction, the better.  Do not wear rubber or gripping soled shoes, they might "catch" on the floor and cause tripping.  Move slowly when changing positions. Use deliberate, concentrated movements and, if needed, use a grab bar or walking aid. Count 15 seconds between each movement. For example, when rising from a seated position, wait 15 seconds after standing to begin walking.  If balance is a continuous problem, you might want to consider a walking aid such as a cane, walking  stick, or walker. Once you've mastered walking with help, you might be ready to try it on your own again.

## 2022-07-30 DIAGNOSIS — G620 Drug-induced polyneuropathy: Secondary | ICD-10-CM | POA: Diagnosis not present

## 2022-07-30 DIAGNOSIS — K219 Gastro-esophageal reflux disease without esophagitis: Secondary | ICD-10-CM | POA: Diagnosis not present

## 2022-07-30 DIAGNOSIS — E039 Hypothyroidism, unspecified: Secondary | ICD-10-CM | POA: Diagnosis not present

## 2022-07-30 DIAGNOSIS — E559 Vitamin D deficiency, unspecified: Secondary | ICD-10-CM | POA: Diagnosis not present

## 2022-07-30 DIAGNOSIS — R32 Unspecified urinary incontinence: Secondary | ICD-10-CM | POA: Diagnosis not present

## 2022-07-30 DIAGNOSIS — M810 Age-related osteoporosis without current pathological fracture: Secondary | ICD-10-CM | POA: Diagnosis not present

## 2022-07-30 DIAGNOSIS — J309 Allergic rhinitis, unspecified: Secondary | ICD-10-CM | POA: Diagnosis not present

## 2022-07-30 DIAGNOSIS — I10 Essential (primary) hypertension: Secondary | ICD-10-CM | POA: Diagnosis not present

## 2022-07-30 DIAGNOSIS — G4700 Insomnia, unspecified: Secondary | ICD-10-CM | POA: Diagnosis not present

## 2022-08-07 LAB — IMMUNOFIXATION ELECTROPHORESIS
IgG (Immunoglobin G), Serum: 1079 mg/dL (ref 600–1540)
IgM, Serum: 33 mg/dL — ABNORMAL LOW (ref 50–300)
Immunoglobulin A: 257 mg/dL (ref 70–320)

## 2022-08-08 ENCOUNTER — Telehealth: Payer: Self-pay | Admitting: Neurology

## 2022-08-08 ENCOUNTER — Telehealth: Payer: Self-pay

## 2022-08-08 DIAGNOSIS — R2681 Unsteadiness on feet: Secondary | ICD-10-CM

## 2022-08-08 DIAGNOSIS — G629 Polyneuropathy, unspecified: Secondary | ICD-10-CM

## 2022-08-08 NOTE — Telephone Encounter (Signed)
-----   Message from Shellia Carwin, MD sent at 08/08/2022  2:31 PM EST ----- Regarding: PT Can we order PT for this lady for gait imbalance and neuropathy?  Thank you,  Kai Levins, MD

## 2022-08-08 NOTE — Telephone Encounter (Signed)
Attempted to call patient to discuss the results of her testing. I left a message asking for a call back to the office.  I will tell her that her labs were normal, but that her B12 was borderline low. Given her symptoms, I will recommend supplementing with B12 1000 mcg daily, bought over the counter at any local drug store or online.   Kai Levins, MD [facility]

## 2022-08-08 NOTE — Telephone Encounter (Signed)
Patient returned my call. I let her know that her B12 was borderline low. I recommended supplementing with 1000 mcg daily.   We also discussed her balance problems. She is open to physical therapy. I will order this for her today.  All questions were answered.  Kai Levins, MD [facility]

## 2022-08-12 ENCOUNTER — Telehealth: Payer: Self-pay | Admitting: Neurology

## 2022-08-12 NOTE — Telephone Encounter (Signed)
Pt called in wanting to double check which Vitamin B the pt should be taking? She forgot to write it down and cannot remember if it's B6 or B12.

## 2022-08-12 NOTE — Telephone Encounter (Signed)
Called and informed pt of what meds to take per Dr. Berdine Addison.

## 2022-08-19 ENCOUNTER — Telehealth: Payer: Self-pay | Admitting: Neurology

## 2022-08-19 NOTE — Telephone Encounter (Signed)
Pt called in stating the forms that Dr. Berdine Addison mailed out somehow got lost in the mail. She is wondering if she might could pick up a copy so she can walk them to the [facility]? She says she is on a strict time limit.

## 2022-08-22 NOTE — Telephone Encounter (Signed)
Called and left a message to  let pt know to call us and let us know what we can do to help her with DMV papers

## 2022-08-26 NOTE — Telephone Encounter (Signed)
Called Pt again to check on DMV papers. No answer left message

## 2022-08-28 NOTE — Telephone Encounter (Signed)
Called and left message to call office

## 2022-08-29 NOTE — Telephone Encounter (Signed)
Called and message was left to call office.

## 2022-08-29 NOTE — Telephone Encounter (Signed)
Left message with the after hour service on 08-29-22 at 12:57 pm   Patient is returning a call to our office

## 2022-09-19 DIAGNOSIS — Z95828 Presence of other vascular implants and grafts: Secondary | ICD-10-CM | POA: Diagnosis not present

## 2022-09-19 DIAGNOSIS — C541 Malignant neoplasm of endometrium: Secondary | ICD-10-CM | POA: Diagnosis not present

## 2022-09-25 NOTE — Telephone Encounter (Signed)
Called and phone is busy

## 2022-09-27 NOTE — Therapy (Incomplete)
OUTPATIENT PHYSICAL THERAPY NEURO EVALUATION   Patient Name: Autumn Frazier MRN: [id number] DOB:[birth year], 82 y.o., female Today's Date: 09/27/2022   PCP: Scheryl Marten, [name]  REFERRING PROVIDER: Shellia Carwin, MD  END OF SESSION:   Past Medical History:  Diagnosis Date   Breast cancer [facility])    Breast cancer of lower-outer quadrant of left female breast ([place]) 11/02/2015   Endometrial cancer ([place])    History of brachytherapy 08/27/17-09/11/17   vaginal cuff 18 Gy in 3 fractions   History of radiation therapy 07/14/2017-08/19/17   pelvis 45 Gy in 25 fractions   Hx of radiation therapy 01/09/16- 02/06/16   Left Breast   Personal history of chemotherapy 04/2017   for uterine ca   Personal history of radiation therapy 2017   for breast ca   Personal history of radiation therapy 2018   for uterine ca   Past Surgical History:  Procedure Laterality Date   BREAST BIOPSY Left 11/06/2015   benign   BREAST BIOPSY Left 10/31/2015   malignant   BREAST BIOPSY Left 10/31/2015   benign   BREAST BIOPSY Left 10/15/2013   benign   BREAST LUMPECTOMY Left    herniated disc     RADIOACTIVE SEED GUIDED PARTIAL MASTECTOMY WITH AXILLARY SENTINEL LYMPH NODE BIOPSY Left 11/27/2015   Procedure: LEFT BREAST SEED AND WIRE GUIDED LUMPECTOMY WITH LEFT AXILLARY LYMPH NODE BIOPSY;  Surgeon: Rolm Bookbinder, MD;  Location: [place];  Service: General;  Laterality: Left;   ROBOTIC ASSISTED TOTAL HYSTERECTOMY WITH BILATERAL SALPINGO OOPHERECTOMY  04/01/2017   ROBOTIC HYSTERECTOMY WITH BSO, SENTINEL NODE MAPPING AND D&C by Dr. Polly Cobia   TONSILLECTOMY     Patient Active Problem List   Diagnosis Date Noted   Other intervertebral disc degeneration, lumbar region 11/17/2017   Endometrial cancer ([place]) 07/02/2017   Osteoporosis 03/18/2016   Genetic testing 12/08/2015   Malignant neoplasm of lower-outer quadrant of left breast of female, estrogen receptor positive ([place]) 11/02/2015    ONSET  DATE: ***  REFERRING DIAG: R26.81 (ICD-10-CM) - Gait instability G62.9 (ICD-10-CM) - Neuropathy  THERAPY DIAG:  No diagnosis found.  Rationale for Evaluation and Treatment: Rehabilitation  SUBJECTIVE:                                                                                                                                                                                             SUBJECTIVE STATEMENT: *** Pt accompanied by: {accompnied:27141}  PERTINENT HISTORY: L breast CA s/p chemo, radiation, and partial mastectomy 2017, endometrial CA  PAIN:  Are you having pain? {OPRCPAIN:27236}  PRECAUTIONS: {Therapy precautions:24002}  WEIGHT BEARING  RESTRICTIONS: No  FALLS: Has patient fallen in last 6 months? {fallsyesno:27318}  LIVING ENVIRONMENT: Lives with: {OPRC lives with:25569::"lives with their family"} Lives in: {Lives in:25570} Stairs: {opstairs:27293} Has following equipment at home: {Assistive devices:23999}  PLOF: {PLOF:24004}  PATIENT GOALS: ***  OBJECTIVE:   DIAGNOSTIC FINDINGS: none recent  COGNITION: Overall cognitive status: {cognition:24006}   SENSATION: {sensation:27233}  POSTURE: {posture:25561}  LOWER EXTREMITY ROM:     Active  Right Eval Left Eval  Hip flexion    Hip extension    Hip abduction    Hip adduction    Hip internal rotation    Hip external rotation    Knee flexion    Knee extension    Ankle dorsiflexion    Ankle plantarflexion    Ankle inversion    Ankle eversion     (Blank rows = not tested)  LOWER EXTREMITY MMT:    MMT Right Eval Left Eval  Hip flexion    Hip extension    Hip abduction    Hip adduction    Hip internal rotation    Hip external rotation    Knee flexion    Knee extension    Ankle dorsiflexion    Ankle plantarflexion    Ankle inversion    Ankle eversion    (Blank rows = not tested)   GAIT: Gait pattern: {gait characteristics:25376} Distance walked: *** Assistive device utilized:  {Assistive devices:23999} Level of assistance: {Levels of assistance:24026} Comments: ***  FUNCTIONAL TESTS:  {Functional tests:24029}   TODAY'S TREATMENT:                                                                                                                              DATE: ***    PATIENT EDUCATION: Education details: *** Person educated: {Person educated:25204} Education method: {Education Method:25205} Education comprehension: {Education Comprehension:25206}  HOME EXERCISE PROGRAM: ***   GOALS: Goals reviewed with patient? {yes/no:20286}  SHORT TERM GOALS: Target date: {follow up:25551}  Patient to be independent with initial HEP. Baseline: HEP initiated Goal status: {GOALSTATUS:25110}    LONG TERM GOALS: Target date: {follow up:25551}  Patient to be independent with advanced HEP. Baseline: Not yet initiated  Goal status: {GOALSTATUS:25110}  Patient to demonstrate B LE strength >/=4+/5.  Baseline: See above Goal status: {GOALSTATUS:25110}  Patient to demonstrate *** ROM WFL and without pain limiting.  Baseline: *** Goal status: {GOALSTATUS:25110}  Patient to report and demonstrate improved head, neck, and shoulder posture at rest and with activity.  Baseline: *** Goal status: {GOALSTATUS:25110}  Patient to demonstrate alternating reciprocal pattern when ascending and descending stairs with good stability and 1 handrail as needed.   Baseline: Unable Goal status: {GOALSTATUS:25110}  Patient to score at least 20/24 on DGI in order to decrease risk of falls.  Baseline: *** Goal status: {GOALSTATUS:25110}  Patient to complete TUG in <14 sec with LRAD in order to decrease risk of falls.   Baseline: *** Goal status: {GOALSTATUS:25110}  Patient to  demonstrate 5xSTS test in <15 sec in order to decrease risk of falls.  Baseline: *** Goal status: {GOALSTATUS:25110}  Patient to score at least ***/56 on [place] in order to decrease risk of falls.   Baseline: *** Goal status: {GOALSTATUS:25110}  Patient to score at least *** on FOTO in order to indicate improved functional outcomes.  Baseline: *** Goal status: {GOALSTATUS:25110}   ASSESSMENT:  CLINICAL IMPRESSION:  Patient is an 82 y/o F presenting to [place] with c/o *** for the past ***  Patient today presenting with ***.    Patient was educated on gentle *** HEP and reported understanding. Prior to current episode, patient was independent. Would benefit from skilled PT services *** x/week for *** weeks to address aforementioned impairments in order to optimize level of function.    OBJECTIVE IMPAIRMENTS: {opptimpairments:25111}.   ACTIVITY LIMITATIONS: {activitylimitations:27494}  PARTICIPATION LIMITATIONS: {participationrestrictions:25113}  PERSONAL FACTORS: {Personal factors:25162} are also affecting patient's functional outcome.   REHAB POTENTIAL: {rehabpotential:25112}  CLINICAL DECISION MAKING: {clinical decision making:25114}  EVALUATION COMPLEXITY: {Evaluation complexity:25115}  PLAN:  PT FREQUENCY: {rehab frequency:25116}  PT DURATION: {rehab duration:25117}  PLANNED INTERVENTIONS: {rehab planned interventions:25118::"Therapeutic exercises","Therapeutic activity","Neuromuscular re-education","Balance training","Gait training","Patient/Family education","Self Care","Joint mobilization"}  PLAN FOR NEXT SESSION: Manuela Neptune, PT 09/27/2022, 7:53 AM

## 2022-09-30 ENCOUNTER — Ambulatory Visit: Payer: Medicare HMO | Attending: Neurology | Admitting: Physical Therapy

## 2022-10-02 ENCOUNTER — Telehealth: Payer: Self-pay

## 2022-10-02 NOTE — Patient Instructions (Signed)
Visit Information  Thank you for taking time to visit with me today. Please don't hesitate to contact me if I can be of assistance to you.   Following are the goals we discussed today:   Goals Addressed             This Visit's Progress    COMPLETED: Care Coordination Activities - no follow up required       Care Coordination Interventions: Advised patient to contact provider about incontinence to discuss treatment options Provided education to patient re: care coordination services Assessed social determinant of health barriers           If you are experiencing a Mental Health or [place] or need someone to talk to, please call the Suicide and Crisis Lifeline: 988 call the [place] National Suicide Prevention Lifeline: (720)507-6509 or TTY: 330-439-6108 TTY 845-083-9989) to talk to a trained counselor call 1-800-273-TALK (toll free, 24 hour hotline) go to [facility] [place] [phone]) call 911   Patient verbalizes understanding of instructions and care plan provided today and agrees to view in [place]. Active MyChart status and patient understanding of how to access instructions and care plan via MyChart confirmed with patient.     No further follow up required:    Peter Garter RN, Jackquline [place], [place] [facility] [phone]

## 2022-10-02 NOTE — Patient Outreach (Signed)
  Care Coordination   Initial Visit Note   10/02/2022 Name: Autumn Frazier MRN: [id number] DOB: [birth year]  Autumn Frazier is a 82 y.o. year old female who sees Autumn Frazier, [name] for primary care. I spoke with  Autumn Frazier by phone today.  What matters to the patients health and wellness today?  I am doing good and today is my 82rd birthday.  I have been having urinary incontinence that I plan to contact my provider.    Goals Addressed             This Visit's Progress    COMPLETED: Care Coordination Activities - no follow up required       Care Coordination Interventions: Advised patient to contact provider about incontinence to discuss treatment options Provided education to patient re: care coordination services Assessed social determinant of health barriers          SDOH assessments and interventions completed:  Yes  SDOH Interventions Today    Flowsheet Row Most Recent Value  SDOH Interventions   Food Insecurity Interventions Intervention Not Indicated  Housing Interventions Intervention Not Indicated  Transportation Interventions Intervention Not Indicated  Utilities Interventions Intervention Not Indicated        Care Coordination Interventions:  Yes, provided   Follow up plan: No further intervention required.   Encounter Outcome:  Pt. Visit Completed  Autumn Garter RN, BSN,CCM, CDE Care Management Coordinator [facility] [facility] [phone]

## 2022-10-02 NOTE — Patient Outreach (Signed)
  Care Coordination   10/02/2022 Name: Autumn Frazier MRN: [id number] DOB: September 05, 1941   Care Coordination Outreach Attempts:  An unsuccessful telephone outreach was attempted today to offer the patient information about available care coordination services as a benefit of their health plan.   Follow Up Plan:  Additional outreach attempts will be made to offer the patient care coordination information and services.   Encounter Outcome:  No Answer   Care Coordination Interventions:  No, not indicated    Peter Garter RN, BSN,CCM, CDE Care Management Coordinator [place] [facility] [phone]

## 2022-10-03 NOTE — Telephone Encounter (Signed)
Called and pt reported that the [facility] lost the paper and then found them so she is okay for now. She said that the [facility] has still not called her back.

## 2022-11-14 DIAGNOSIS — Z95828 Presence of other vascular implants and grafts: Secondary | ICD-10-CM | POA: Diagnosis not present

## 2022-11-14 DIAGNOSIS — C541 Malignant neoplasm of endometrium: Secondary | ICD-10-CM | POA: Diagnosis not present

## 2023-01-29 DIAGNOSIS — K5730 Diverticulosis of large intestine without perforation or abscess without bleeding: Secondary | ICD-10-CM | POA: Diagnosis not present

## 2023-01-29 DIAGNOSIS — K769 Liver disease, unspecified: Secondary | ICD-10-CM | POA: Diagnosis not present

## 2023-01-29 DIAGNOSIS — R911 Solitary pulmonary nodule: Secondary | ICD-10-CM | POA: Diagnosis not present

## 2023-01-29 DIAGNOSIS — N281 Cyst of kidney, acquired: Secondary | ICD-10-CM | POA: Diagnosis not present

## 2023-01-29 DIAGNOSIS — C541 Malignant neoplasm of endometrium: Secondary | ICD-10-CM | POA: Diagnosis not present

## 2023-02-06 DIAGNOSIS — C541 Malignant neoplasm of endometrium: Secondary | ICD-10-CM | POA: Diagnosis not present

## 2023-02-06 DIAGNOSIS — N39498 Other specified urinary incontinence: Secondary | ICD-10-CM | POA: Diagnosis not present

## 2023-02-18 DIAGNOSIS — K449 Diaphragmatic hernia without obstruction or gangrene: Secondary | ICD-10-CM | POA: Diagnosis not present

## 2023-02-18 DIAGNOSIS — C541 Malignant neoplasm of endometrium: Secondary | ICD-10-CM | POA: Diagnosis not present

## 2023-02-18 DIAGNOSIS — E042 Nontoxic multinodular goiter: Secondary | ICD-10-CM | POA: Diagnosis not present

## 2023-02-18 DIAGNOSIS — N281 Cyst of kidney, acquired: Secondary | ICD-10-CM | POA: Diagnosis not present

## 2023-02-19 DIAGNOSIS — Z0000 Encounter for general adult medical examination without abnormal findings: Secondary | ICD-10-CM | POA: Diagnosis not present

## 2023-02-19 DIAGNOSIS — J4520 Mild intermittent asthma, uncomplicated: Secondary | ICD-10-CM | POA: Diagnosis not present

## 2023-02-19 DIAGNOSIS — G620 Drug-induced polyneuropathy: Secondary | ICD-10-CM | POA: Diagnosis not present

## 2023-02-19 DIAGNOSIS — G4700 Insomnia, unspecified: Secondary | ICD-10-CM | POA: Diagnosis not present

## 2023-02-19 DIAGNOSIS — K219 Gastro-esophageal reflux disease without esophagitis: Secondary | ICD-10-CM | POA: Diagnosis not present

## 2023-02-19 DIAGNOSIS — J309 Allergic rhinitis, unspecified: Secondary | ICD-10-CM | POA: Diagnosis not present

## 2023-02-19 DIAGNOSIS — E039 Hypothyroidism, unspecified: Secondary | ICD-10-CM | POA: Diagnosis not present

## 2023-02-19 DIAGNOSIS — R32 Unspecified urinary incontinence: Secondary | ICD-10-CM | POA: Diagnosis not present

## 2023-02-19 DIAGNOSIS — N1831 Chronic kidney disease, stage 3a: Secondary | ICD-10-CM | POA: Diagnosis not present

## 2023-02-19 DIAGNOSIS — I10 Essential (primary) hypertension: Secondary | ICD-10-CM | POA: Diagnosis not present

## 2023-02-19 DIAGNOSIS — Z1331 Encounter for screening for depression: Secondary | ICD-10-CM | POA: Diagnosis not present

## 2023-02-27 DIAGNOSIS — C541 Malignant neoplasm of endometrium: Secondary | ICD-10-CM | POA: Diagnosis not present

## 2023-03-12 DIAGNOSIS — E611 Iron deficiency: Secondary | ICD-10-CM | POA: Diagnosis not present

## 2023-03-13 DIAGNOSIS — Z95828 Presence of other vascular implants and grafts: Secondary | ICD-10-CM | POA: Diagnosis not present

## 2023-03-13 DIAGNOSIS — C541 Malignant neoplasm of endometrium: Secondary | ICD-10-CM | POA: Diagnosis not present

## 2023-04-03 DIAGNOSIS — C541 Malignant neoplasm of endometrium: Secondary | ICD-10-CM | POA: Diagnosis not present

## 2023-04-03 DIAGNOSIS — Z5112 Encounter for antineoplastic immunotherapy: Secondary | ICD-10-CM | POA: Diagnosis not present

## 2023-04-03 DIAGNOSIS — Z79899 Other long term (current) drug therapy: Secondary | ICD-10-CM | POA: Diagnosis not present

## 2023-04-03 DIAGNOSIS — Z5111 Encounter for antineoplastic chemotherapy: Secondary | ICD-10-CM | POA: Diagnosis not present

## 2023-04-04 DIAGNOSIS — C541 Malignant neoplasm of endometrium: Secondary | ICD-10-CM | POA: Diagnosis not present

## 2023-04-17 DIAGNOSIS — C541 Malignant neoplasm of endometrium: Secondary | ICD-10-CM | POA: Diagnosis not present

## 2023-04-24 DIAGNOSIS — Z5111 Encounter for antineoplastic chemotherapy: Secondary | ICD-10-CM | POA: Diagnosis not present

## 2023-04-24 DIAGNOSIS — Z5112 Encounter for antineoplastic immunotherapy: Secondary | ICD-10-CM | POA: Diagnosis not present

## 2023-04-24 DIAGNOSIS — Z79899 Other long term (current) drug therapy: Secondary | ICD-10-CM | POA: Diagnosis not present

## 2023-04-24 DIAGNOSIS — F32A Depression, unspecified: Secondary | ICD-10-CM | POA: Diagnosis not present

## 2023-04-24 DIAGNOSIS — G629 Polyneuropathy, unspecified: Secondary | ICD-10-CM | POA: Diagnosis not present

## 2023-04-24 DIAGNOSIS — C541 Malignant neoplasm of endometrium: Secondary | ICD-10-CM | POA: Diagnosis not present

## 2023-04-25 DIAGNOSIS — C541 Malignant neoplasm of endometrium: Secondary | ICD-10-CM | POA: Diagnosis not present

## 2023-05-08 DIAGNOSIS — C541 Malignant neoplasm of endometrium: Secondary | ICD-10-CM | POA: Diagnosis not present

## 2023-05-15 DIAGNOSIS — Z5111 Encounter for antineoplastic chemotherapy: Secondary | ICD-10-CM | POA: Diagnosis not present

## 2023-05-15 DIAGNOSIS — C541 Malignant neoplasm of endometrium: Secondary | ICD-10-CM | POA: Diagnosis not present

## 2023-05-15 DIAGNOSIS — Z5112 Encounter for antineoplastic immunotherapy: Secondary | ICD-10-CM | POA: Diagnosis not present

## 2023-05-16 DIAGNOSIS — C541 Malignant neoplasm of endometrium: Secondary | ICD-10-CM | POA: Diagnosis not present

## 2023-05-29 DIAGNOSIS — R590 Localized enlarged lymph nodes: Secondary | ICD-10-CM | POA: Diagnosis not present

## 2023-05-29 DIAGNOSIS — N281 Cyst of kidney, acquired: Secondary | ICD-10-CM | POA: Diagnosis not present

## 2023-05-29 DIAGNOSIS — R911 Solitary pulmonary nodule: Secondary | ICD-10-CM | POA: Diagnosis not present

## 2023-05-29 DIAGNOSIS — K449 Diaphragmatic hernia without obstruction or gangrene: Secondary | ICD-10-CM | POA: Diagnosis not present

## 2023-05-29 DIAGNOSIS — K5790 Diverticulosis of intestine, part unspecified, without perforation or abscess without bleeding: Secondary | ICD-10-CM | POA: Diagnosis not present

## 2023-05-29 DIAGNOSIS — C541 Malignant neoplasm of endometrium: Secondary | ICD-10-CM | POA: Diagnosis not present

## 2023-06-05 DIAGNOSIS — C541 Malignant neoplasm of endometrium: Secondary | ICD-10-CM | POA: Diagnosis not present

## 2023-06-05 DIAGNOSIS — Z5112 Encounter for antineoplastic immunotherapy: Secondary | ICD-10-CM | POA: Diagnosis not present

## 2023-06-05 DIAGNOSIS — G629 Polyneuropathy, unspecified: Secondary | ICD-10-CM | POA: Diagnosis not present

## 2023-06-05 DIAGNOSIS — Z5111 Encounter for antineoplastic chemotherapy: Secondary | ICD-10-CM | POA: Diagnosis not present

## 2023-06-05 DIAGNOSIS — F32A Depression, unspecified: Secondary | ICD-10-CM | POA: Diagnosis not present

## 2023-06-06 DIAGNOSIS — C541 Malignant neoplasm of endometrium: Secondary | ICD-10-CM | POA: Diagnosis not present

## 2023-06-19 DIAGNOSIS — C541 Malignant neoplasm of endometrium: Secondary | ICD-10-CM | POA: Diagnosis not present

## 2023-06-26 DIAGNOSIS — C541 Malignant neoplasm of endometrium: Secondary | ICD-10-CM | POA: Diagnosis not present

## 2023-06-26 DIAGNOSIS — Z5111 Encounter for antineoplastic chemotherapy: Secondary | ICD-10-CM | POA: Diagnosis not present

## 2023-06-26 DIAGNOSIS — Z79899 Other long term (current) drug therapy: Secondary | ICD-10-CM | POA: Diagnosis not present

## 2023-06-26 DIAGNOSIS — Z79890 Hormone replacement therapy (postmenopausal): Secondary | ICD-10-CM | POA: Diagnosis not present

## 2023-06-26 DIAGNOSIS — Z7982 Long term (current) use of aspirin: Secondary | ICD-10-CM | POA: Diagnosis not present

## 2023-06-26 DIAGNOSIS — Z7952 Long term (current) use of systemic steroids: Secondary | ICD-10-CM | POA: Diagnosis not present

## 2023-06-26 DIAGNOSIS — Z5112 Encounter for antineoplastic immunotherapy: Secondary | ICD-10-CM | POA: Diagnosis not present

## 2023-06-27 DIAGNOSIS — C541 Malignant neoplasm of endometrium: Secondary | ICD-10-CM | POA: Diagnosis not present

## 2023-07-07 DIAGNOSIS — F418 Other specified anxiety disorders: Secondary | ICD-10-CM | POA: Diagnosis not present

## 2023-07-07 DIAGNOSIS — C55 Malignant neoplasm of uterus, part unspecified: Secondary | ICD-10-CM | POA: Diagnosis not present

## 2023-07-07 DIAGNOSIS — I10 Essential (primary) hypertension: Secondary | ICD-10-CM | POA: Diagnosis not present

## 2023-07-09 DIAGNOSIS — C541 Malignant neoplasm of endometrium: Secondary | ICD-10-CM | POA: Diagnosis not present

## 2023-07-17 DIAGNOSIS — C541 Malignant neoplasm of endometrium: Secondary | ICD-10-CM | POA: Diagnosis not present

## 2023-07-17 DIAGNOSIS — Z5112 Encounter for antineoplastic immunotherapy: Secondary | ICD-10-CM | POA: Diagnosis not present

## 2023-07-17 DIAGNOSIS — Z5111 Encounter for antineoplastic chemotherapy: Secondary | ICD-10-CM | POA: Diagnosis not present

## 2023-07-17 DIAGNOSIS — Z79899 Other long term (current) drug therapy: Secondary | ICD-10-CM | POA: Diagnosis not present

## 2023-07-18 DIAGNOSIS — C541 Malignant neoplasm of endometrium: Secondary | ICD-10-CM | POA: Diagnosis not present

## 2023-07-18 NOTE — Progress Notes (Deleted)
I saw Autumn Frazier in neurology clinic on 07/30/23 in follow up for neuropathy.  HPI: Autumn Frazier is a 82 y.o. year old female with a history of breast cancer, endometrial cancer, vit D deficiency, osteoporosis, GERD, hypothyroidism, insomnia  who we last saw on 07/26/22.  To briefly review: 5 years ago, patient had endometrial cancer and had chemotherapy and radiation. Shortly after chemo, she developed numbness in her hands. Within the year, she noticed symptoms in her feet. She does not notice pain in her feet, but notice her balance has been affected. She will use a cane when in public or at night, because when it is dark, she is more off balance. She usually likes to wear "cute shoes" (heels, etc) but has switched to more tennis shoe types has helped. She had one fall about 1 month ago. She was wearing flip flops and tripped over a mat.   She mentioned that about 9 months after chemo, she was walking and put her hand on a swivel chair that spun and she fell and broke her sacrum. She did not require surgery, but noticed the balance was worse after this.   Patient has previously tried gabapentin for her symptoms without significant difference.   Patient denies back or neck pain.   Patient sees Dr. Clifton Frazier in oncology. Cancer history per clinic note from 01/24/22: History of Present Illness: Autumn Frazier is a 82 y.o. 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   Her tumor history is as follows:  Initiation of Taxol [place] and Herceptin beginning on April 24, 2017. Continue with single agent Herceptin during radiation treatment.  Completion of External beam XRT with vaginal cuff brachytherapy in December of 2018  Resume Cycle #4 of Taxol/Caroplatin/Herceptin September 18, 2017 with completion of cycle #6 in February 2019   Continuation of maintenance Herceptin beginning December 04, 2017 with completion of 28 cycles in December 2020  Progressive disease in the left periauricular region noted on CT imaging in December 2020 confirmed on PET imaging  Radiation Treatment Dates: 10/07/2019 through 11/16/2019; Site Technique Total Dose (Gy) Dose per Fx (Gy) Completed Fx Beam Energies; Abdomen: Abd IMRT 56/56 2 28/28 6X   She has done well since completion of her radiation therapy and has demonstrated no evidence of recurrent disease. A repeat CT scan of the chest abdomen and pelvis was performed on Jan 22, 2022.    She continues to stay active.   Of note, patient takes zinc supplementation. She also takes Lexapro 20 mg daily. She started this after her cancer diagnosis for depression. She is doing well currently and was recently offered to come off, but patient decided to continue since she was feeling well.   The patient does not report any constitutional symptoms like fever, night sweats, anorexia or unintentional weight loss.   EtOH use: wine, occasionally on weekend, never more than 2 glasses  Restrictive diet? No Family history of neuropathy/myopathy/NM disease? No  Most recent Assessment and Plan (07/26/22): Autumn Frazier is a 82 y.o. female who presents for evaluation of numbness in feet and hands and gait imbalance. She has a relevant medical history of breast cancer, endometrial cancer, vit D deficiency, osteoporosis, GERD, hypothyroidism, insomnia. Her neurological examination is pertinent for diminished sensation in a stocking glove, length dependent fashion and gait imbalance.  Patient's symptoms started soon after treatment with Taxol/Caroplatin/Herceptin and are consistent with chemotherapy induced neuropathy. I will look for other treatable causes of neuropathy today. Fortunately, patient does not have pain and would not benefit from medication as medication would treat pain and tingling, not numbness and lack of  sensation.   PLAN: -Blood work: B12, HbA1c, IFE.  -Foot care discussed  Since their last visit: B12 was borderline low. I recommended B12 1000 mcg daily. ***  I recommended PT for imbalance.***  ROS: Pertinent positive and negative systems reviewed in HPI. ***   MEDICATIONS:  Outpatient Encounter Medications as of 07/30/2023  Medication Sig   acetaminophen (TYLENOL) 500 MG tablet Take 1,000 mg by mouth every 6 (six) hours as needed for moderate pain.  (Patient not taking: Reported on 07/26/2022)   calcium carbonate (OS-CAL) 600 MG TABS tablet Take 600 mg by mouth 2 (two) times daily with a meal.    cholecalciferol (VITAMIN D) 1000 units tablet Take 1,000 Units by mouth daily.   escitalopram (LEXAPRO) 20 MG tablet Take 20 mg by mouth daily.    HYDROcodone-acetaminophen (NORCO/VICODIN) 5-325 MG tablet Take 1-2 tablets by mouth every 6 (six) hours as needed. (Patient not taking: Reported on 03/20/2020)   levothyroxine (SYNTHROID, LEVOTHROID) 88 MCG tablet Take 88 mcg by mouth daily before breakfast.    ondansetron (ZOFRAN) 4 MG tablet Take 1 tablet (4 mg total) by mouth every 8 (eight) hours as needed for nausea or vomiting. (Patient not taking: Reported on 03/20/2020)   Zinc 50 MG TABS Take 50 mg by mouth daily.   Facility-Administered Encounter Medications as of 07/30/2023  Medication   sodium chloride flush (NS) 0.9 % injection 10 mL    PAST MEDICAL HISTORY: Past Medical History:  Diagnosis Date   Breast cancer (HCC)    Breast cancer of lower-outer quadrant of left female breast (HCC) 11/02/2015   Endometrial cancer (HCC)    History of brachytherapy 08/27/17-09/11/17   vaginal cuff 18 Gy in 3 fractions   History of radiation therapy 07/14/2017-08/19/17   pelvis 45 Gy in 25 fractions   Hx of radiation therapy 01/09/16- 02/06/16   Left Breast   Personal history of chemotherapy 04/2017   for uterine ca   Personal history of radiation therapy 2017   for breast ca   Personal history  of radiation therapy 2018   for uterine ca    PAST SURGICAL HISTORY: Past Surgical History:  Procedure Laterality Date   BREAST BIOPSY Left 11/06/2015   benign   BREAST BIOPSY Left 10/31/2015   malignant   BREAST BIOPSY Left 10/31/2015   benign   BREAST BIOPSY Left 10/15/2013   benign   BREAST LUMPECTOMY Left    herniated disc     RADIOACTIVE SEED GUIDED PARTIAL MASTECTOMY WITH AXILLARY SENTINEL LYMPH NODE BIOPSY Left 11/27/2015   Procedure: LEFT BREAST SEED AND WIRE GUIDED LUMPECTOMY WITH LEFT AXILLARY LYMPH NODE BIOPSY;  Surgeon: Emelia Loron, MD;  Location: [facility];  Service: General;  Laterality: Left;   ROBOTIC ASSISTED TOTAL HYSTERECTOMY WITH BILATERAL SALPINGO OOPHERECTOMY  04/01/2017   ROBOTIC HYSTERECTOMY WITH BSO, SENTINEL NODE MAPPING AND D&C by Dr. Clifton Frazier   TONSILLECTOMY      ALLERGIES: No Known Allergies  FAMILY HISTORY: Family History  Problem Relation Age of Onset   Brain cancer Sister    Ovarian cancer Maternal Aunt    Breast cancer Other 40    SOCIAL HISTORY: Social History   Tobacco Use  Smoking status: Former    Current packs/day: 0.00    Types: Cigarettes    Quit date: 07/02/1982    Years since quitting: 41.0   Smokeless tobacco: Never  Vaping Use   Vaping status: Never Used  Substance Use Topics   Alcohol use: Yes    Alcohol/week: 3.0 standard drinks of alcohol    Types: 3 Glasses of wine per week    Comment: occas   Drug use: No   Social History   Social History Narrative   Are you right handed or left handed? Right   Are you currently employed ? no   Caffeine one in am   Do you live at home alone?yes      What type of home do you live in: 1 story or 2 story? Two don't use second level        Objective:  Vital Signs:  There were no vitals taken for this visit.  General:*** General appearance: Awake and alert. No distress. Cooperative with exam.  Skin: No obvious rash or jaundice. HEENT: Atraumatic.  Anicteric. Lungs: Non-labored breathing on room air  Heart: Regular Abdomen: Soft, non tender. Extremities: No edema. No obvious deformity.  Musculoskeletal: No obvious joint swelling.  Neurological: Mental Status: Alert. Speech fluent. No pseudobulbar affect Cranial Nerves: CNII: No RAPD. Visual fields intact. CNIII, IV, VI: PERRL. No nystagmus. EOMI. CN V: Facial sensation intact bilaterally to fine touch. [place] clench strong. Jaw jerk***. CN VII: Facial muscles symmetric and strong. No ptosis at rest or after sustained upgaze***. CN VIII: Hears finger rub well bilaterally. CN IX: No hypophonia. CN X: Palate elevates symmetrically. CN XI: Full strength shoulder shrug bilaterally. CN XII: Tongue protrusion full and midline. No atrophy or fasciculations. No significant dysarthria*** Motor: Tone is ***. *** fasciculations in *** extremities. *** atrophy. No grip or percussive myotonia.  Individual muscle group testing (MRC grade out of 5):  Movement     Neck flexion ***    Neck extension ***     Right Left   Shoulder abduction *** ***   Shoulder adduction *** ***   Shoulder ext rotation *** ***   Shoulder int rotation *** ***   Elbow flexion *** ***   Elbow extension *** ***   Wrist extension *** ***   Wrist flexion *** ***   Finger abduction - FDI *** ***   Finger abduction - ADM *** ***   Finger extension *** ***   Finger distal flexion - 2/3 *** ***   Finger distal flexion - 4/5 *** ***   Thumb flexion - FPL *** ***   Thumb abduction - APB *** ***    Hip flexion *** ***   Hip extension *** ***   Hip adduction *** ***   Hip abduction *** ***   Knee extension *** ***   Knee flexion *** ***   Dorsiflexion *** ***   Plantarflexion *** ***   Inversion *** ***   Eversion *** ***   Great toe extension *** ***   Great toe flexion *** ***     Reflexes:  Right Left  Bicep *** ***  Tricep *** ***  BrRad *** ***  Knee *** ***  Ankle *** ***    Pathological Reflexes: Babinski: *** response bilaterally*** Hoffman: *** Troemner: *** Pectoral: *** Palmomental: *** Facial: *** Midline tap: *** Sensation: Pinprick: *** Vibration: *** Temperature: *** Proprioception: *** Coordination: Intact finger-to- nose-finger and heel-to-shin bilaterally. Romberg negative.*** Gait: Able to rise from chair  with arms crossed unassisted. Normal, narrow-based gait. Able to tandem walk. Able to walk on toes and heels.***   Lab and Test Review: New results: 07/26/22: B12: 260 IFE: no M protein HbA1c: 5.9  CBC w/ diff (07/17/23): signficiant for Hb 8.9, MCV 97.9  Previously reviewed results: MRI lumbar spine w/wo contrast (11/29/17): FINDINGS: Segmentation:  Standard.   Alignment: Mild reverse S curvature with the apex at L2 and dextro apex at L5. Normal lumbar lordosis without listhesis.   Vertebrae: No fracture, evidence of discitis, or bone lesion of lumbar vertebral bodies. Fracture of bilateral sacral ala and S1-2 segments with edema and enhancement indicating recent injury. Mild edema and surrounding soft tissues.   Conus medullaris and cauda equina: Conus extends to the L1 level. Conus and cauda equina appear normal.   Paraspinal and other soft tissues: Multiple renal cysts bilaterally.   Disc levels:   L1-2: Mild facet hypertrophy. No significant foraminal or canal stenosis.   L2-3: Disc bulge with small right subarticular protrusion and annular fissure as well as mild facet hypertrophy. Mild bilateral foraminal and canal stenosis.   L3-4: Disc bulge eccentric to the left and tiny central extrusion with inferior migration. Mild facet hypertrophy. Mild left foraminal and lateral recess stenosis. Disc contact on descending left L4 nerve root in left lateral recess. No canal stenosis.   L4-5: Disc bulge, endplate marginal osteophytes, and mild facet hypertrophy. Mild foraminal stenosis. No significant canal  stenosis.   L5-S1: Disc bulge with mild right and moderate left facet hypertrophy. Mild left foraminal stenosis. No significant canal stenosis.   IMPRESSION: 1. Fracture of bilateral sacral ala and S1/2 segments with edema and enhancement indicating recent injury. Fracture is partially visualized. 2. Mild S-shaped curvature of lumbar spine and multilevel mild spondylosis. 3. Mild L2-3 canal stenosis and left L3-4 lateral recess stenosis with contact on descending left L4 nerve root. No high-grade canal stenosis. 4. Multilevel mild foraminal stenosis. No high-grade foraminal stenosis.  ASSESSMENT: This is Autumn Frazier, a 82 y.o. female with:  ***  Plan: *** -B12, MMA, folate, B1, B6?***  Return to clinic in ***  Total time spent reviewing records, interview, history/exam, documentation, and coordination of care on day of encounter:  *** min  Jacquelyne Balint, MD

## 2023-07-30 ENCOUNTER — Ambulatory Visit: Payer: Medicare HMO | Admitting: Neurology

## 2023-07-31 DIAGNOSIS — C541 Malignant neoplasm of endometrium: Secondary | ICD-10-CM | POA: Diagnosis not present

## 2023-08-04 DIAGNOSIS — K449 Diaphragmatic hernia without obstruction or gangrene: Secondary | ICD-10-CM | POA: Diagnosis not present

## 2023-08-04 DIAGNOSIS — R590 Localized enlarged lymph nodes: Secondary | ICD-10-CM | POA: Diagnosis not present

## 2023-08-04 DIAGNOSIS — C541 Malignant neoplasm of endometrium: Secondary | ICD-10-CM | POA: Diagnosis not present

## 2023-08-04 DIAGNOSIS — N281 Cyst of kidney, acquired: Secondary | ICD-10-CM | POA: Diagnosis not present

## 2023-08-05 DIAGNOSIS — C541 Malignant neoplasm of endometrium: Secondary | ICD-10-CM | POA: Diagnosis not present

## 2023-08-18 DIAGNOSIS — F418 Other specified anxiety disorders: Secondary | ICD-10-CM | POA: Diagnosis not present

## 2023-08-18 DIAGNOSIS — R200 Anesthesia of skin: Secondary | ICD-10-CM | POA: Diagnosis not present

## 2023-08-18 DIAGNOSIS — I10 Essential (primary) hypertension: Secondary | ICD-10-CM | POA: Diagnosis not present

## 2023-08-18 DIAGNOSIS — D692 Other nonthrombocytopenic purpura: Secondary | ICD-10-CM | POA: Diagnosis not present

## 2023-10-09 DIAGNOSIS — C541 Malignant neoplasm of endometrium: Secondary | ICD-10-CM | POA: Diagnosis not present

## 2023-10-28 DIAGNOSIS — Z961 Presence of intraocular lens: Secondary | ICD-10-CM | POA: Diagnosis not present

## 2023-10-28 DIAGNOSIS — H47323 Drusen of optic disc, bilateral: Secondary | ICD-10-CM | POA: Diagnosis not present

## 2023-10-28 DIAGNOSIS — H52201 Unspecified astigmatism, right eye: Secondary | ICD-10-CM | POA: Diagnosis not present

## 2023-10-28 DIAGNOSIS — H40011 Open angle with borderline findings, low risk, right eye: Secondary | ICD-10-CM | POA: Diagnosis not present

## 2023-10-28 DIAGNOSIS — H21231 Degeneration of iris (pigmentary), right eye: Secondary | ICD-10-CM | POA: Diagnosis not present

## 2023-11-18 DIAGNOSIS — C772 Secondary and unspecified malignant neoplasm of intra-abdominal lymph nodes: Secondary | ICD-10-CM | POA: Diagnosis not present

## 2023-11-18 DIAGNOSIS — K7689 Other specified diseases of liver: Secondary | ICD-10-CM | POA: Diagnosis not present

## 2023-11-18 DIAGNOSIS — C7989 Secondary malignant neoplasm of other specified sites: Secondary | ICD-10-CM | POA: Diagnosis not present

## 2023-11-18 DIAGNOSIS — R911 Solitary pulmonary nodule: Secondary | ICD-10-CM | POA: Diagnosis not present

## 2023-11-18 DIAGNOSIS — N1330 Unspecified hydronephrosis: Secondary | ICD-10-CM | POA: Diagnosis not present

## 2023-11-18 DIAGNOSIS — C541 Malignant neoplasm of endometrium: Secondary | ICD-10-CM | POA: Diagnosis not present

## 2023-11-27 DIAGNOSIS — C541 Malignant neoplasm of endometrium: Secondary | ICD-10-CM | POA: Diagnosis not present

## 2023-12-16 DIAGNOSIS — I059 Rheumatic mitral valve disease, unspecified: Secondary | ICD-10-CM | POA: Diagnosis not present

## 2023-12-16 DIAGNOSIS — T451X5A Adverse effect of antineoplastic and immunosuppressive drugs, initial encounter: Secondary | ICD-10-CM | POA: Diagnosis not present

## 2023-12-16 DIAGNOSIS — Z5181 Encounter for therapeutic drug level monitoring: Secondary | ICD-10-CM | POA: Diagnosis not present

## 2023-12-16 DIAGNOSIS — Z79899 Other long term (current) drug therapy: Secondary | ICD-10-CM | POA: Diagnosis not present

## 2023-12-16 DIAGNOSIS — C541 Malignant neoplasm of endometrium: Secondary | ICD-10-CM | POA: Diagnosis not present

## 2024-01-06 DIAGNOSIS — C541 Malignant neoplasm of endometrium: Secondary | ICD-10-CM | POA: Diagnosis not present

## 2024-01-06 DIAGNOSIS — Z95828 Presence of other vascular implants and grafts: Secondary | ICD-10-CM | POA: Diagnosis not present

## 2024-01-08 DIAGNOSIS — C541 Malignant neoplasm of endometrium: Secondary | ICD-10-CM | POA: Diagnosis not present

## 2024-01-08 DIAGNOSIS — Z5112 Encounter for antineoplastic immunotherapy: Secondary | ICD-10-CM | POA: Diagnosis not present

## 2024-01-26 DIAGNOSIS — C541 Malignant neoplasm of endometrium: Secondary | ICD-10-CM | POA: Diagnosis not present

## 2024-01-29 DIAGNOSIS — Z5112 Encounter for antineoplastic immunotherapy: Secondary | ICD-10-CM | POA: Diagnosis not present

## 2024-01-29 DIAGNOSIS — C541 Malignant neoplasm of endometrium: Secondary | ICD-10-CM | POA: Diagnosis not present

## 2024-02-19 DIAGNOSIS — I10 Essential (primary) hypertension: Secondary | ICD-10-CM | POA: Diagnosis not present

## 2024-02-19 DIAGNOSIS — Z5112 Encounter for antineoplastic immunotherapy: Secondary | ICD-10-CM | POA: Diagnosis not present

## 2024-02-19 DIAGNOSIS — Z79899 Other long term (current) drug therapy: Secondary | ICD-10-CM | POA: Diagnosis not present

## 2024-02-19 DIAGNOSIS — G629 Polyneuropathy, unspecified: Secondary | ICD-10-CM | POA: Diagnosis not present

## 2024-02-19 DIAGNOSIS — C541 Malignant neoplasm of endometrium: Secondary | ICD-10-CM | POA: Diagnosis not present

## 2024-02-19 NOTE — Progress Notes (Signed)
 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  History of Present Illness: Autumn [place]  Frazier is a 83 y.o. 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    Her tumor history is as follows: Initiation of Taxol  carbo and Herceptin  beginning on April 24, 2017. Continue with single agent Herceptin  during radiation treatment. Completion of External beam XRT with vaginal cuff brachytherapy in December of 2018 Resume Cycle #4 of Taxol /Caroplatin/Herceptin  September 18, 2017 with completion of cycle #6 in February 2019 Continuation of maintenance Herceptin  beginning December 04, 2017  with completion of 28 cycles in December 2020 Progressive disease in the left periaortic region noted on CT imaging in December 2020 confirmed on PET imaging Radiation Treatment Dates: 10/07/2019 through 11/16/2019;  Site Technique Total Dose (Gy) Dose per Fx (Gy) Completed Fx Beam Energies; Abdomen: Abd IMRT 56/56 2 28/28 6X  Recurrent disease identified in the periaortic region in May 2024 confirmed with pet imaging in June 2024 Cycle #1 carboplatin , docetaxel , pembrolizumab  04/03/2023 with completion of 6 cycles of this chemotherapeutic regimen in October 2024. Chemotherapy holiday beginning in November 2024 Cycle #1 of trastuzumab  deruxtecan on January 08, 2024  Autumn Frazier presents today for consideration of cycle #3 of Enhertu.  Review of Systems:  On review of systems, Autumn Frazier reports that she she continues to do great.  She denies any significant symptoms related to treatment.  She is continuing with normal daily activities.  She denies any chest pain or shortness of breath.  She is having no fevers chills or nausea or vomiting.   Bowels and bladder are working well.  She denies any pain.  She reports that her neuropathy is stable.  No significant change there.  No chest pain or shortness of breath.  No mucositis.  No headaches or dizziness.  Appetite is good.  Energy level waxes and wanes but again she feels that she is able to complete activities that she wishes during the daytime without issue.  No other complaints verbalized.   Past Medical History: 1. Hypertension  2. Hypothyroidism 3. Fibrocystic breast disease 4. Cervical degenerative disc disease 5. Left breast cancer, stage 1  - medical oncologist is Dr. Sandria Gell.  She had radiation therapy completed in June 2017.  - on letrozole since October 2017. 6.  Stage IIIC serous carcinoma of the uterus Initiation of Taxol  carbo and Herceptin  beginning on April 24, 2017. Continue with single agent Herceptin  during radiation treatment. Completion of External beam XRT with vaginal cuff brachytherapy in December of 2018 Resume Cycle #4 of Taxol /Caroplatin/Herceptin  September 18, 2017 with completion of cycle #6 in February 2019 Continuation of maintenance Herceptin  beginning December 04, 2017  with completion of 28 cycles in December 2020 Progressive disease in the left periauricular region noted on CT imaging in December 2020 confirmed on PET imaging Radiation Treatment Dates: 10/07/2019 through 11/16/2019;  Site Technique Total Dose (Gy) Dose per Fx (Gy) Completed Fx Beam Energies; Abdomen: Abd IMRT 56/56 2 28/28 6X   OB/GYN History: G3 P3 with 3 vaginal deliveries.  Menarche in her early teens.  Menopause at age 1.  She never took HRT.  Last Pap test was in October 2013 and was with normal cytology and was HPV negative.  She denies a  history of abnormal Paps.  No other history of gynecologic issues.  Past Surgical History: 1. Left breast lumpectomy in March 2017 2. Back surgery greater than 30 years ago 3.  Robotic hyst/bso/sentinel nodes July 2018  Health  Maintenance:  Patient last had a mammogram in January 2018 and it was normal.  Her last colonoscopy was 5 years ago with normal findings.  Family History: 1. Maternal aunt with ovarian cancer, diagnosed at age 82  Social History:  Patient is married.  She and her husband live in [place].  She is a housewife.  She denies tobacco or recreational drug use.  She consumes alcohol socially.  Medications: Current Home Medications   Medication Sig  ADVAIR HFA 45-21 MCG/ACT inhaler   anastrozole  (ARIMIDEX ) 1 MG tablet Take one tablet (1 mg dose) by mouth every evening. Patient not taking: Reported on 01/26/2024  anastrozole  (ARIMIDEX ) 1 MG tablet Take one tablet (1 mg dose) by mouth. Patient not taking: Reported on 01/26/2024  aspirin 81 mg chewable tablet Chew one tablet (81 mg dose) by mouth every evening. Patient not taking: Reported on 01/26/2024  b complex vitamins tablet Take one tablet by mouth every morning.  Biotin 5000 MCG TABS Take one tablet by mouth every morning. Patient not taking: Reported on 01/26/2024  calcium carbonate (OS-CAL) 600 MG TABS Take one tablet (600 mg dose) by mouth 2 (two) times daily with meals.  Cholecalciferol (VITAMIN D-3) 1000 units CAPS Take one capsule (25 mcg dose) by mouth every morning.  Cholecalciferol 25 MCG (1000 UT) tablet Take one tablet (1,000 Units dose) by mouth daily.  diclofenac  sodium (VOLTAREN ) 75 mg EC tablet Take one tablet (75 mg dose) by mouth. Patient not taking: Reported on 01/26/2024  diphenhydrAMINE  HCl, Sleep, 50 MG CAPS Take 1 tablet by mouth every evening. Patient not taking: Reported on 01/26/2024  diphenoxylate-atropine  (LOMOTIL) 2.5-0.025 mg per tablet Take one tablet by mouth every 6 (six) hours as needed for Diarrhea. Max Daily Amount: 4 tablets  escitalopram oxalate (LEXAPRO) 20 mg tablet TAKE 1 TABLET(20 MG) BY MOUTH DAILY  gabapentin (NEURONTIN) 300 mg capsule TAKE 1 CAPSULE(300 MG) BY MOUTH TWICE DAILY Patient not taking: Reported  on 01/26/2024  hydrocortisone (ANUSOL-HC,PROCTOSOL,PROCTOZONE,PROCTO-MED) 2.5% rectal cream Place one Application rectally 2 (two) times daily. Patient not taking: Reported on 01/26/2024  ibuprofen (ADVIL,MOTRIN) 600 mg tablet Take one tablet (600 mg total) by mouth every 6 (six) hours as needed for Pain.  levothyroxine sodium (SYNTHROID,LEVOTHROID,LEVOXYL) 137 mcg tablet 88 mcg  LORAzepam  (ATIVAN ) 0.5 mg tablet Take one tablet (0.5 mg dose) by mouth.  MAGNESIUM PO Take by mouth daily.  melatonin 5 MG TBDP Take one tablet (5 mg dose) by mouth at bedtime. Patient not taking: Reported on 01/26/2024  ondansetron  (ZOFRAN ) 8 MG tablet Take one tablet (8 mg total) by mouth every 8 (eight) hours as needed for Nausea.  ondansetron  (ZOFRAN ) 8 MG tablet Take one tablet (8 mg dose) by mouth every 8 (eight) hours as needed for Nausea (or vomiting).  pantoprazole sodium (PROTONIX) 20 mg tablet Take one tablet (20 mg dose) by mouth daily. Patient not taking: Reported on 01/26/2024  prochlorperazine (COMPAZINE) 10 MG tablet Take one tablet (10 mg total) by mouth every 6 (six) hours as needed. Patient not taking: Reported on 01/26/2024  prochlorperazine (COMPAZINE) 5 MG tablet Take one tablet (5 mg dose) by mouth every 6 (six) hours as needed for Nausea (or vomiting). Patient not taking: Reported on 01/26/2024  promethazine (PHENERGAN) 12.5 MG tablet  Take one tablet (12.5 mg total) by mouth every 6 (six) hours as needed for Nausea. Patient not taking: Reported on 01/26/2024  Zinc Chelated 50 MG TABS Take 50 mg by mouth daily. Patient not taking: Reported on 01/26/2024     Allergies: Allergies  Allergen Reactions  . Aspirin Hives    Physical Exam: BP 133/63   Pulse 66   Temp 97.4 F (36.3 C) (Temporal)   Resp 18   Ht 5' 6 (1.676 m)   Wt 154 lb (69.9 kg)   SpO2 97%   BMI 24.86 kg/m   Pain score 0 out of 10.  GENERAL: In general, this is a pleasant Caucasian female in a no acute distress. PSYCHIATRIC: She  is alert and oriented x 4 and appropriate throughout the entire examination with normal thought processes PULMONARY: Clear to auscultation bilaterally, no wheezes or crackles.  Good breath sounds are noted throughout all lung fields. ABDOMEN: Her abdomen is noted to be soft, non-tender and nondistended.  No rebound or guarding is noted.  No masses are appreicated, no organomegaly is noted.  No fascial defects are noted. GENERAL: In general, this is a pleasant Caucasian female in a no acute distress. PSYCHIATRIC: She is alert and oriented x 4 and appropriate throughout the entire examination with normal thought processes HEENT: Pupils are even, no thyromegaly CARDIOVASCULAR: Regular rate and rhythm without murmurs, rubs or gallops PULMONARY: Clear to auscultation bilaterally, no wheezes or crackles.  Good breath sounds are noted throughout all lung fields. ABDOMEN: Her abdomen is noted to be soft, non-tender and nondistended.  No rebound or guarding is noted.  No masses are appreicated, no organomegaly is noted.  No fascial defects are noted. PELVIC:   External genitalia:  Normal external female genitalia. No lesions are seen.  Fairly significant foreshortening of the vaginal cuff is noted consistent with prior treatment.  No bleeding discharge noted.  Grossly on the mons, or bilateral labia.  She has a normal BUS.    Perineum/Anus: No lesions seen  Urethra:Urethra appears normal, midline and without prolapse or gross lesion..  Bladder: No bladder prolapse is noted. No bladder tenderness is appreciated on  palpation.  Vagina:  On speculum examination, the vaginal mucosa is palpation of the distal vagina unremarkable.  Cervix:   Surgically absent.  Uterus: Surgically absent.  Adnexa: No distinct adnexal masses or fullness appreciated.  Parametria: Unable to assess secondary to foreshortening and scarring of vagina  Digital Rectal Examination: Deferred NODES:  No inguinal adenopathy, No  supraclavicular adenopathy.  No axillary lymphadenopathy. MUSCULOSKELETAL/EXTREMITIES: Bilateral lower extremities symmetric, not tender, without edema.   SKIN: Warm, dry, well perfused  NODES:  No inguinal adenopathy, No supraclavicular adenopathy.  No axillary lymphadenopathy. MUSCULOSKELETAL/EXTREMITIES: Bilateral lower extremities symmetric, not tender, without edema.   SKIN: Warm, dry, well perfused    Labs: Recent Results (from the past 72 hours)  CBC And Differential   Collection Time: 02/19/24  8:30 AM  Result Value Ref Range   WBC 3.0 (L) 3.6 - 11.1 thou/mcL   RBC 3.73 3.69 - 4.88 million/mcL   HGB 11.8 11.4 - 14.4 gm/dL   HCT 65.6 66.6 - 58.5 %   MCV 92.0 79.0 - 95.0 fL   MCH 31.6 26.8 - 33.2 pg   MCHC 34.4 33.5 - 35.5 gm/dL   Plt Ct 790 834 - 646 thou/mcL   RDW SD 50.6 fL   RDW CV 16.4 (H) 12.0 - 15.1 %   MPV 7.8 7.5 - 10.7  fL   NEUTROPHIL % 56.3 43.3-71.9 % %   LYMPHOCYTE % 25.5 16.8-43.5 % %   MONOCYTE % 11.1 4.5 - 12.4 %   Eosinophil % 6.4 0.7 - 7.8 %   BASOPHIL % 0.7 0.0 - 1.1 %   ABSOLUTE NEUTROPHIL COUNT 1.68 (L) 1.90 - 7.20 thou/mcL   ABSOLUTE LYMPHOCYTE COUNT 0.76 (L) 1.10 - 2.70 thou/mcL   Absolute Monocyte Count 0.33 0.00 - 0.80 thou/mcL   Absolute Eosinophil Count 0.19 0.00 - 0.50 thou/mcL   Absolute Basophil Count 0.02 0.00 - 0.10 thou/mcL      Radiology: No results found.   A/P:  1.  Recurrent stage IIIC serous carcinoma of the uterus.  Autumn Frazier continues to do exceptionally well and appears to be tolerating the trastuzumab  deruxtecan without significant toxicity Based on today's evaluation, there is no obvious evidence of treatment related toxicity and importantly no significant impact on quality of life.   Laboratory data is reviewed and is appropriate for treatment and she will receive cycle #3 of this regimen today. We will plan on repeat imaging and I have requested for a repeat PET CT scan given that this is the modality we have been  using to track her disease and also given her developing renal insufficiency to avoid contrast injury to the kidneys. Unless otherwise indicated, we will have this performed prior to her next treatment in 3 weeks and will use this information to determine next steps of care She and her son verbalized understanding the care plan that has been outlined and all questions answered.  2.  Peripheral neuropathy Stable without significant change while on current treatment Management per neurology at this time  3.  Anxiety/Depression Autumn Frazier will continue on the Lexapro and follow up with her PCP for continued management of this.    4.  Left hydronephrosis Renal function is relatively stable and on today's evaluation has declined just a little bit to 1.1 where previously her creatinine was 1.3 Continue with close evaluation and monitoring    Almarie LOISE Barrio, MD 02/19/2024, 9:13 AM    This note was dictated using voice recognition software. Similar sounding words can inadvertently get transcribed and not corrected upon review.

## 2024-03-08 DIAGNOSIS — C541 Malignant neoplasm of endometrium: Secondary | ICD-10-CM | POA: Diagnosis not present

## 2024-03-09 ENCOUNTER — Other Ambulatory Visit (HOSPITAL_BASED_OUTPATIENT_CLINIC_OR_DEPARTMENT_OTHER): Payer: Self-pay | Admitting: Internal Medicine

## 2024-03-09 DIAGNOSIS — R32 Unspecified urinary incontinence: Secondary | ICD-10-CM | POA: Diagnosis not present

## 2024-03-09 DIAGNOSIS — J4520 Mild intermittent asthma, uncomplicated: Secondary | ICD-10-CM | POA: Diagnosis not present

## 2024-03-09 DIAGNOSIS — Z0000 Encounter for general adult medical examination without abnormal findings: Secondary | ICD-10-CM | POA: Diagnosis not present

## 2024-03-09 DIAGNOSIS — E559 Vitamin D deficiency, unspecified: Secondary | ICD-10-CM | POA: Diagnosis not present

## 2024-03-09 DIAGNOSIS — K219 Gastro-esophageal reflux disease without esophagitis: Secondary | ICD-10-CM | POA: Diagnosis not present

## 2024-03-09 DIAGNOSIS — M810 Age-related osteoporosis without current pathological fracture: Secondary | ICD-10-CM

## 2024-03-09 DIAGNOSIS — J309 Allergic rhinitis, unspecified: Secondary | ICD-10-CM | POA: Diagnosis not present

## 2024-03-09 DIAGNOSIS — E039 Hypothyroidism, unspecified: Secondary | ICD-10-CM | POA: Diagnosis not present

## 2024-03-09 DIAGNOSIS — Z23 Encounter for immunization: Secondary | ICD-10-CM | POA: Diagnosis not present

## 2024-03-09 DIAGNOSIS — Z1331 Encounter for screening for depression: Secondary | ICD-10-CM | POA: Diagnosis not present

## 2024-03-09 DIAGNOSIS — I10 Essential (primary) hypertension: Secondary | ICD-10-CM | POA: Diagnosis not present

## 2024-03-09 DIAGNOSIS — G4700 Insomnia, unspecified: Secondary | ICD-10-CM | POA: Diagnosis not present

## 2024-03-11 DIAGNOSIS — G629 Polyneuropathy, unspecified: Secondary | ICD-10-CM | POA: Diagnosis not present

## 2024-03-11 DIAGNOSIS — Z5112 Encounter for antineoplastic immunotherapy: Secondary | ICD-10-CM | POA: Diagnosis not present

## 2024-03-11 DIAGNOSIS — F419 Anxiety disorder, unspecified: Secondary | ICD-10-CM | POA: Diagnosis not present

## 2024-03-11 DIAGNOSIS — C541 Malignant neoplasm of endometrium: Secondary | ICD-10-CM | POA: Diagnosis not present

## 2024-03-11 DIAGNOSIS — F32A Depression, unspecified: Secondary | ICD-10-CM | POA: Diagnosis not present

## 2024-03-11 NOTE — Progress Notes (Signed)
 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  History of Present Illness: Autumn [place]  Frazier is a 83 y.o. 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    Her tumor history is as follows: Initiation of Taxol  carbo and Herceptin  beginning on April 24, 2017. Continue with single agent Herceptin  during radiation treatment. Completion of External beam XRT with vaginal cuff brachytherapy in December of 2018 Resume Cycle #4 of Taxol /Caroplatin/Herceptin  September 18, 2017 with completion of cycle #6 in February 2019 Continuation of maintenance Herceptin  beginning December 04, 2017  with completion of 28 cycles in December 2020 Progressive disease in the left periaortic region noted on CT imaging in December 2020 confirmed on PET imaging Radiation Treatment Dates: 10/07/2019 through 11/16/2019;  Site Technique Total Dose (Gy) Dose per Fx (Gy) Completed Fx Beam Energies; Abdomen: Abd IMRT 56/56 2 28/28 6X  Recurrent disease identified in the periaortic region in May 2024 confirmed with pet imaging in June 2024 Cycle #1 carboplatin , docetaxel , pembrolizumab  04/03/2023 with completion of 6 cycles of this chemotherapeutic regimen in October 2024. Chemotherapy holiday beginning in November 2024 Cycle #1 of trastuzumab  deruxtecan on January 08, 2024  Autumn Frazier has now completed 4 cycles of Enhertu.  She had a follow-up PET CT scan performed which I have independently reviewed and compared to prior scans and, this demonstrates a decrease in the hypermetabolic retrocrural node and retroperitoneal abdominal node both in terms of size and in terms of PET avidity. (the retrocrural node measures currently12 x  6 mm compared to 18 x 14 mm; and the left periaortic node currently measures18 x 10 mm where previously it measured 20 x 13 mm).  Review of Systems:  On review of systems, Autumn Frazier reports that she she continues to do great.  She offers no complaints at all at this time.  She feels that she is able to function normally and maintain normal activity level.  Energy level is good.  She is very excited about how well she is doing.   Past Medical History: 1. Hypertension  2. Hypothyroidism 3. Fibrocystic breast disease 4. Cervical degenerative disc disease 5. Left breast cancer, stage 1  - medical oncologist is Dr. Sandria Gell.  She had radiation therapy completed in June 2017.  - on letrozole since October 2017. 6.  Stage IIIC serous carcinoma of the uterus Initiation of Taxol  carbo and Herceptin  beginning on April 24, 2017. Continue with single agent Herceptin  during radiation treatment. Completion of External beam XRT with vaginal cuff brachytherapy in December of 2018 Resume Cycle #4 of Taxol /Caroplatin/Herceptin  September 18, 2017 with completion of cycle #6 in February 2019 Continuation of maintenance Herceptin  beginning December 04, 2017  with completion of 28 cycles in December 2020 Progressive disease in the left periauricular region noted on CT imaging in December 2020 confirmed on PET imaging Radiation Treatment Dates: 10/07/2019 through 11/16/2019;  Site Technique Total Dose (Gy) Dose per Fx (Gy) Completed Fx Beam Energies; Abdomen: Abd IMRT 56/56 2 28/28 6X   OB/GYN History: G3 P3 with 3 vaginal deliveries.  Menarche in her early teens.  Menopause at age 44.  She never took HRT.  Last Pap test was in October 2013 and was with normal cytology and  was HPV negative.  She denies a history of abnormal Paps.  No other history of gynecologic issues.  Past Surgical History: 1. Left breast lumpectomy in March 2017 2. Back surgery greater than 30 years ago 3.  Robotic hyst/bso/sentinel nodes  July 2018  Health Maintenance:  Patient last had a mammogram in January 2018 and it was normal.  Her last colonoscopy was 5 years ago with normal findings.  Family History: 1. Maternal aunt with ovarian cancer, diagnosed at age 33  Social History:  Patient is married.  She and her husband live in [place].  She is a housewife.  She denies tobacco or recreational drug use.  She consumes alcohol socially.  Medications: Current Home Medications   Medication Sig  ADVAIR HFA 45-21 MCG/ACT inhaler   anastrozole  (ARIMIDEX ) 1 MG tablet Take one tablet (1 mg dose) by mouth every evening. Patient not taking: Reported on 01/26/2024  anastrozole  (ARIMIDEX ) 1 MG tablet Take one tablet (1 mg dose) by mouth. Patient not taking: Reported on 01/26/2024  aspirin 81 mg chewable tablet Chew one tablet (81 mg dose) by mouth every evening. Patient not taking: Reported on 01/26/2024  b complex vitamins tablet Take one tablet by mouth every morning.  Biotin 5000 MCG TABS Take one tablet by mouth every morning. Patient not taking: Reported on 01/26/2024  calcium carbonate (OS-CAL) 600 MG TABS Take one tablet (600 mg dose) by mouth 2 (two) times daily with meals.  Cholecalciferol (VITAMIN D-3) 1000 units CAPS Take one capsule (25 mcg dose) by mouth every morning.  Cholecalciferol 25 MCG (1000 UT) tablet Take one tablet (1,000 Units dose) by mouth daily.  diclofenac  sodium (VOLTAREN ) 75 mg EC tablet Take one tablet (75 mg dose) by mouth. Patient not taking: Reported on 01/26/2024  diphenhydrAMINE  HCl, Sleep, 50 MG CAPS Take 1 tablet by mouth every evening. Patient not taking: Reported on 01/26/2024  diphenoxylate-atropine  (LOMOTIL) 2.5-0.025 mg per tablet Take one tablet by mouth every 6 (six) hours as needed for Diarrhea. Max Daily Amount: 4 tablets  escitalopram oxalate (LEXAPRO) 20 mg tablet TAKE 1 TABLET(20 MG) BY MOUTH DAILY  gabapentin (NEURONTIN) 300 mg capsule TAKE 1 CAPSULE(300 MG) BY MOUTH TWICE DAILY Patient  not taking: Reported on 01/26/2024  hydrocortisone (ANUSOL-HC,PROCTOSOL,PROCTOZONE,PROCTO-MED) 2.5% rectal cream Place one Application rectally 2 (two) times daily. Patient not taking: Reported on 01/26/2024  ibuprofen (ADVIL,MOTRIN) 600 mg tablet Take one tablet (600 mg total) by mouth every 6 (six) hours as needed for Pain.  levothyroxine sodium (SYNTHROID,LEVOTHROID,LEVOXYL) 137 mcg tablet 88 mcg  LORAzepam  (ATIVAN ) 0.5 mg tablet Take one tablet (0.5 mg dose) by mouth.  MAGNESIUM PO Take by mouth daily.  melatonin 5 MG TBDP Take one tablet (5 mg dose) by mouth at bedtime. Patient not taking: Reported on 01/26/2024  ondansetron  (ZOFRAN ) 8 MG tablet Take one tablet (8 mg total) by mouth every 8 (eight) hours as needed for Nausea.  ondansetron  (ZOFRAN ) 8 MG tablet Take one tablet (8 mg dose) by mouth every 8 (eight) hours as needed for Nausea (or vomiting).  pantoprazole sodium (PROTONIX) 20 mg tablet Take one tablet (20 mg dose) by mouth daily. Patient not taking: Reported on 01/26/2024  prochlorperazine (COMPAZINE) 10 MG tablet Take one tablet (10 mg total) by mouth every 6 (six) hours as needed. Patient not taking: Reported on 01/26/2024  prochlorperazine (COMPAZINE) 5 MG tablet Take one tablet (5 mg dose) by mouth every 6 (six) hours as needed for Nausea (or vomiting). Patient not taking: Reported on  01/26/2024  promethazine (PHENERGAN) 12.5 MG tablet Take one tablet (12.5 mg total) by mouth every 6 (six) hours as needed for Nausea. Patient not taking: Reported on 01/26/2024  Zinc Chelated 50 MG TABS Take 50 mg by mouth daily. Patient not taking: Reported on 01/26/2024     Allergies: Allergies  Allergen Reactions  . Aspirin Hives    Physical Exam: BP 129/76 (BP Location: Left Upper Arm, Patient Position: Sitting)   Pulse 70   Temp 97.1 F (36.2 C) (Temporal)   Resp 17   Ht 5' 6 (1.676 m)   Wt 153 lb 12.8 oz (69.8 kg)   SpO2 92%   BMI 24.82 kg/m   Pain score 0 out of 10.  GENERAL: In  general, this is a pleasant Caucasian female in a no acute distress. PSYCHIATRIC: She is alert and oriented x 4 and appropriate throughout the entire examination with normal thought processes   Labs: No results found for this or any previous visit (from the past 72 hours).      Radiology: No results found.   A/P:  1.  Recurrent stage IIIC serous carcinoma of the uterus.  Ms. Degner continues to do exceptionally well and appears to be tolerating the trastuzumab  deruxtecan without significant toxicity PET/CT imaging is reviewed and demonstrates evidence of treatment response and no evidence of new disease noted. As she is otherwise doing well and we are seeing response to treatment, she would like to continue forward with additional treatments which I think is appropriate. Laboratory data is reviewed and is appropriate for treatment and she will receive cycle #4 of this regimen today. Unless otherwise indicated, I will see her back in 3 weeks for consideration of cycle #5 of this regimen. She and her son are advised that provided that she continues to do well, we would anticipate 3 additional cycles of treatment with repeat imaging at that time She and her son verbalized understanding the care plan that has been outlined and all questions answered.  2.  Peripheral neuropathy Stable without significant change while on current treatment Management per neurology at this time  3.  Anxiety/Depression Ms. Askari will continue on the Lexapro and follow up with her PCP for continued management of this.    4.  Left hydronephrosis Renal function remains stable Most recent imaging does not demonstrate significant hydronephrosis reflective of probable treatment response.    Almarie LOISE Barrio, MD 03/14/2024, 1:28 PM    This note was dictated using voice recognition software. Similar sounding words can inadvertently get transcribed and not corrected upon review.

## 2024-03-11 NOTE — Progress Notes (Signed)
 Adult Chemotherapy Infusion Note Enhurtu Cycle 4, Day 1   Oncology Assessments  ECOG Performance Status: 1 (03/11/2024  8:58 AM)   The distress assessment was completed and the patient did not report a distress score of greater than or equal to 6.  Concerns were reported to physician/APP as appropriate.    Chemotherapy Consent Verification  The Chemotherapy Consent form signature has been verified.   Infusion Assessment  Blood return was able to be verified from implanted port.   The patient's name, date of birth, medication, dose, rate, route, expiration date/time, appearance and physical integrity was verified by a second Charity fundraiser.  Refer to [facility] for chemotherapy administration times.  Patient tolerated infusion without complications.  Blood return verified after infusion completed .   IV Flushed  IV line flushed per protocol after infusion completed.   Post-Infusion Instructions  Patient was instructed to notify medical team if they experience any intolerable chemotherapy side effects

## 2024-04-01 DIAGNOSIS — Z5112 Encounter for antineoplastic immunotherapy: Secondary | ICD-10-CM | POA: Diagnosis not present

## 2024-04-01 DIAGNOSIS — C541 Malignant neoplasm of endometrium: Secondary | ICD-10-CM | POA: Diagnosis not present

## 2024-04-01 DIAGNOSIS — Z79899 Other long term (current) drug therapy: Secondary | ICD-10-CM | POA: Diagnosis not present

## 2024-04-01 NOTE — Progress Notes (Signed)
 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  History of Present Illness: Autumn [place]  Frazier is a 83 y.o. 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    Her tumor history is as follows: Initiation of Taxol  carbo and Herceptin  beginning on April 24, 2017. Continue with single agent Herceptin  during radiation treatment. Completion of External beam XRT with vaginal cuff brachytherapy in December of 2018 Resume Cycle #4 of Taxol /Caroplatin/Herceptin  September 18, 2017 with completion of cycle #6 in February 2019 Continuation of maintenance Herceptin  beginning December 04, 2017  with completion of 28 cycles in December 2020 Progressive disease in the left periaortic region noted on CT imaging in December 2020 confirmed on PET imaging Radiation Treatment Dates: 10/07/2019 through 11/16/2019;  Site Technique Total Dose (Gy) Dose per Fx (Gy) Completed Fx Beam Energies; Abdomen: Abd IMRT 56/56 2 28/28 6X  Recurrent disease identified in the periaortic region in May 2024 confirmed with pet imaging in June 2024 Cycle #1 carboplatin , docetaxel , pembrolizumab  04/03/2023 with completion of 6 cycles of this chemotherapeutic regimen in October 2024. Chemotherapy holiday beginning in November 2024 Cycle #1 of trastuzumab  deruxtecan on January 08, 2024  Autumn Frazier has now completed 4 cycles of Enhertu.  Interval PET CT following cycle #3 demonstrated a decrease in the hypermetabolic retrocrural node and retroperitoneal abdominal node both in terms of size and in terms of PET avidity.   Review of Systems:  On review of systems, Autumn Frazier reports that she she continues to do great.  She offers no complaints at all at this time. She denies  abdominal pain or bloating. Appetite is good without nausea or vomiting. Bladder and bowel function are at baseline. She denies vaginal bleeding.  No fevers, rashes, chest pain or dyspnea. Energy level is good.  She is very excited about how well she is doing.   Past Medical History: 1. Hypertension  2. Hypothyroidism 3. Fibrocystic breast disease 4. Cervical degenerative disc disease 5. Left breast cancer, stage 1  - medical oncologist is Dr. Sandria Gell.  She had radiation therapy completed in June 2017.  - on letrozole since October 2017. 6.  Stage IIIC serous carcinoma of the uterus Initiation of Taxol  carbo and Herceptin  beginning on April 24, 2017. Continue with single agent Herceptin  during radiation treatment. Completion of External beam XRT with vaginal cuff brachytherapy in December of 2018 Resume Cycle #4 of Taxol /Caroplatin/Herceptin  September 18, 2017 with completion of cycle #6 in February 2019 Continuation of maintenance Herceptin  beginning December 04, 2017  with completion of 28 cycles in December 2020 Progressive disease in the left periauricular region noted on CT imaging in December 2020 confirmed on PET imaging Radiation Treatment Dates: 10/07/2019 through 11/16/2019;  Site Technique Total Dose (Gy) Dose per Fx (Gy) Completed Fx Beam Energies; Abdomen: Abd IMRT 56/56 2 28/28 6X   OB/GYN History: G3 P3 with 3 vaginal deliveries.  Menarche in her early teens.  Menopause at age 42.  She never took HRT.  Last Pap test was in October 2013 and was with normal cytology and was HPV negative.  She denies a history of abnormal Paps.  No other history of gynecologic issues.  Past Surgical History: 1. Left breast lumpectomy in March 2017 2. Back surgery greater than 30  years ago 3.  Robotic hyst/bso/sentinel nodes July 2018  Health Maintenance:  Patient last had a mammogram in January 2018 and it was normal.  Her last colonoscopy was 5 years ago with normal findings.  Family  History: 1. Maternal aunt with ovarian cancer, diagnosed at age 76  Social History:  Patient is married.  She and her husband live in [place].  She is a housewife.  She denies tobacco or recreational drug use.  She consumes alcohol socially.  Medications: Current Home Medications   Medication Sig  ADVAIR HFA 45-21 MCG/ACT inhaler   anastrozole  (ARIMIDEX ) 1 MG tablet Take one tablet (1 mg dose) by mouth every evening.  anastrozole  (ARIMIDEX ) 1 MG tablet Take one tablet (1 mg dose) by mouth.  aspirin 81 mg chewable tablet Chew one tablet (81 mg dose) by mouth every evening.  b complex vitamins tablet Take one tablet by mouth every morning.  Biotin 5000 MCG TABS Take one tablet by mouth every morning.  calcium carbonate (OS-CAL) 600 MG TABS Take one tablet (600 mg dose) by mouth 2 (two) times daily with meals.  Cholecalciferol (VITAMIN D-3) 1000 units CAPS Take one capsule (25 mcg dose) by mouth every morning.  Cholecalciferol 25 MCG (1000 UT) tablet Take one tablet (1,000 Units dose) by mouth daily.  diclofenac  sodium (VOLTAREN ) 75 mg EC tablet Take one tablet (75 mg dose) by mouth.  diphenhydrAMINE  HCl, Sleep, 50 MG CAPS Take 1 tablet by mouth every evening.  diphenoxylate-atropine  (LOMOTIL) 2.5-0.025 mg per tablet Take one tablet by mouth every 6 (six) hours as needed for Diarrhea. Max Daily Amount: 4 tablets  escitalopram oxalate (LEXAPRO) 20 mg tablet TAKE 1 TABLET(20 MG) BY MOUTH DAILY  gabapentin (NEURONTIN) 300 mg capsule TAKE 1 CAPSULE(300 MG) BY MOUTH TWICE DAILY  hydrocortisone (ANUSOL-HC,PROCTOSOL,PROCTOZONE,PROCTO-MED) 2.5% rectal cream Place one Application rectally 2 (two) times daily.  ibuprofen (ADVIL,MOTRIN) 600 mg tablet Take one tablet (600 mg total) by mouth every 6 (six) hours as needed for Pain.  levothyroxine sodium (SYNTHROID,LEVOTHROID,LEVOXYL) 137 mcg tablet 88 mcg  LORAzepam  (ATIVAN ) 0.5 mg tablet Take one tablet (0.5 mg dose) by mouth.  MAGNESIUM PO Take by  mouth daily.  melatonin 5 MG TBDP Take one tablet (5 mg dose) by mouth at bedtime.  ondansetron  (ZOFRAN ) 8 MG tablet Take one tablet (8 mg total) by mouth every 8 (eight) hours as needed for Nausea.  ondansetron  (ZOFRAN ) 8 MG tablet Take one tablet (8 mg dose) by mouth every 8 (eight) hours as needed for Nausea (or vomiting).  pantoprazole sodium (PROTONIX) 20 mg tablet Take one tablet (20 mg dose) by mouth daily.  prochlorperazine (COMPAZINE) 10 MG tablet Take one tablet (10 mg total) by mouth every 6 (six) hours as needed.  prochlorperazine (COMPAZINE) 5 MG tablet Take one tablet (5 mg dose) by mouth every 6 (six) hours as needed for Nausea (or vomiting).  promethazine (PHENERGAN) 12.5 MG tablet Take one tablet (12.5 mg total) by mouth every 6 (six) hours as needed for Nausea.  Zinc Chelated 50 MG TABS Take 50 mg by mouth daily.     Allergies: Allergies  Allergen Reactions  . Aspirin Hives    Physical Exam: BP 134/68 (BP Location: Left Upper Arm, Patient Position: Sitting)   Pulse 70   Temp 97.6 F (36.4 C) (Temporal)   Resp 16   Ht 5' 6 (1.676 m)   Wt 153 lb 6.4 oz (69.6 kg)   SpO2 97%   BMI 24.76 kg/m   Pain  score 0 out of 10.  GENERAL: In general, this is a pleasant Caucasian female in a no acute distress. HEART: regular rate and rhythm LUNGS: CTA bilaterally ABDOMEN: soft, non tender, non distended EXTREMITIES: bilaterally symmetric, minor pretibial edema PSYCHIATRIC: She is alert and oriented x 4 and appropriate throughout the entire examination with normal thought processes   Labs: Recent Results (from the past 72 hours)  CBC And Differential   Collection Time: 04/01/24  1:41 PM  Result Value Ref Range   WBC 3.5 (L) 3.7 - 11.0 thou/mcL   RBC 3.44 (L) 4.01 - 4.90 million/mcL   HGB 11.4 (L) 12.2 - 14.9 gm/dL   HCT 64.1 64.1 - 52.0 %   MCV 104.1 (H) 82.0 - 98.0 fL   MCH 33.1 (H) 27.0 - 33.0 pg   MCHC 31.8 31.0 - 37.0 gm/dL   Plt Ct 782 849 - 599 thou/mcL    RDW SD 65.4 (H) 36.0 - 47.0 fL   RDW CV 17.0 (H) 11.8 - 14.9 %   NRBC% 0.0 0 /100WBC   Absolute NRBC Count 0.00 0 thou/mcL   NEUTROPHIL % 51.4 %   LYMPHOCYTE % 28.9 %   MONOCYTE % 12.4 %   Eosinophil % 6.4 %   BASOPHIL % 0.6 %   ABSOLUTE NEUTROPHIL COUNT 1.78 1.50 - 7.50 thou/mcL   ABSOLUTE LYMPHOCYTE COUNT 1.00 1.00 - 4.50 thou/mcL   Absolute Monocyte Count 0.43 0.10 - 0.80 thou/mcL   Absolute Eosinophil Count 0.22 0.00 - 0.50 thou/mcL   Absolute Basophil Count 0.02 0.00 - 0.20 thou/mcL        Radiology: No results found.   A/P:  1.  Recurrent stage IIIC serous carcinoma of the uterus.  Ms. Marlin continues to do exceptionally well and appears to be tolerating the trastuzumab  deruxtecan without significant toxicity As she is otherwise doing well and we are seeing response to treatment, she would like to continue forward with additional treatments which I think is appropriate. Laboratory data is reviewed and is appropriate for treatment and she will receive cycle #5 of this regimen today. Unless otherwise indicated, she will return in 3 weeks for consideration of cycle #6 of this regimen. She and her daughter are advised that provided that she continues to do well, we would anticipate 2 additional cycles of treatment with repeat imaging at that time  2.  Peripheral neuropathy Stable without significant change while on current treatment Management per neurology at this time  3.  Anxiety/Depression Ms. Bodiford will continue on the Lexapro and follow up with her PCP for continued management of this.    4.  Left hydronephrosis Renal function remains stable Most recent imaging does not demonstrate significant hydronephrosis reflective of probable treatment response.    Delon LITTIE Sharps, PA-C 04/01/2024, 2:11 PM

## 2024-04-01 NOTE — Progress Notes (Signed)
 Adult Chemotherapy Infusion Note Enhertu Cycle 5, Day 1   Oncology Assessments  ECOG Performance Status: 0 (04/01/2024  2:15 PM)   The distress assessment was completed and the patient did not report a distress score of greater than or equal to 6.  Concerns were reported to physician/APP as appropriate.    Chemotherapy Consent Verification  The Chemotherapy Consent form signature has been verified.   Infusion Assessment  Blood return was able to be verified from implanted port.   The patient's name, date of birth, medication, dose, rate, route, expiration date/time, appearance and physical integrity was verified by a second Charity fundraiser.  Refer to [facility] for chemotherapy administration times.  Patient tolerated infusion without complications.  Blood return verified after infusion completed after infusion completed.   IV Flushed  IV line flushed per protocol after infusion completed.   Post-Infusion Instructions  Patient was instructed to notify medical team if they experience any pain, infusion site tenderness, nausea, vomiting, shortness of breath, itching, dizziness, generalized pain, flushing, swelling of lips, or swelling of tongue.

## 2024-04-15 ENCOUNTER — Emergency Department (HOSPITAL_COMMUNITY)

## 2024-04-15 ENCOUNTER — Inpatient Hospital Stay (HOSPITAL_COMMUNITY)

## 2024-04-15 ENCOUNTER — Inpatient Hospital Stay (HOSPITAL_COMMUNITY)
Admission: EM | Admit: 2024-04-15 | Discharge: 2024-04-17 | DRG: 389 | Disposition: A | Discharge status: Home / Self Care | Attending: Internal Medicine | Admitting: Internal Medicine

## 2024-04-15 ENCOUNTER — Encounter (HOSPITAL_COMMUNITY): Payer: Self-pay | Admitting: Internal Medicine

## 2024-04-15 ENCOUNTER — Other Ambulatory Visit: Payer: Self-pay

## 2024-04-15 DIAGNOSIS — F32A Depression, unspecified: Secondary | ICD-10-CM | POA: Diagnosis not present

## 2024-04-15 DIAGNOSIS — Z923 Personal history of irradiation: Secondary | ICD-10-CM | POA: Diagnosis not present

## 2024-04-15 DIAGNOSIS — K5652 Intestinal adhesions [bands] with complete obstruction: Secondary | ICD-10-CM | POA: Diagnosis not present

## 2024-04-15 DIAGNOSIS — I129 Hypertensive chronic kidney disease with stage 1 through stage 4 chronic kidney disease, or unspecified chronic kidney disease: Secondary | ICD-10-CM | POA: Diagnosis present

## 2024-04-15 DIAGNOSIS — F419 Anxiety disorder, unspecified: Secondary | ICD-10-CM | POA: Diagnosis present

## 2024-04-15 DIAGNOSIS — K5669 Other partial intestinal obstruction: Secondary | ICD-10-CM | POA: Diagnosis not present

## 2024-04-15 DIAGNOSIS — Z8041 Family history of malignant neoplasm of ovary: Secondary | ICD-10-CM

## 2024-04-15 DIAGNOSIS — Z79899 Other long term (current) drug therapy: Secondary | ICD-10-CM | POA: Diagnosis not present

## 2024-04-15 DIAGNOSIS — T451X5A Adverse effect of antineoplastic and immunosuppressive drugs, initial encounter: Secondary | ICD-10-CM | POA: Diagnosis present

## 2024-04-15 DIAGNOSIS — Z87891 Personal history of nicotine dependence: Secondary | ICD-10-CM | POA: Diagnosis not present

## 2024-04-15 DIAGNOSIS — Z808 Family history of malignant neoplasm of other organs or systems: Secondary | ICD-10-CM

## 2024-04-15 DIAGNOSIS — K2100 Gastro-esophageal reflux disease with esophagitis, without bleeding: Secondary | ICD-10-CM | POA: Diagnosis not present

## 2024-04-15 DIAGNOSIS — Z853 Personal history of malignant neoplasm of breast: Secondary | ICD-10-CM

## 2024-04-15 DIAGNOSIS — R109 Unspecified abdominal pain: Secondary | ICD-10-CM | POA: Diagnosis not present

## 2024-04-15 DIAGNOSIS — D7589 Other specified diseases of blood and blood-forming organs: Secondary | ICD-10-CM | POA: Diagnosis present

## 2024-04-15 DIAGNOSIS — E039 Hypothyroidism, unspecified: Secondary | ICD-10-CM | POA: Diagnosis present

## 2024-04-15 DIAGNOSIS — N1832 Chronic kidney disease, stage 3b: Secondary | ICD-10-CM | POA: Diagnosis present

## 2024-04-15 DIAGNOSIS — Z8542 Personal history of malignant neoplasm of other parts of uterus: Secondary | ICD-10-CM | POA: Diagnosis not present

## 2024-04-15 DIAGNOSIS — K56609 Unspecified intestinal obstruction, unspecified as to partial versus complete obstruction: Secondary | ICD-10-CM | POA: Diagnosis not present

## 2024-04-15 DIAGNOSIS — R1013 Epigastric pain: Secondary | ICD-10-CM | POA: Diagnosis not present

## 2024-04-15 DIAGNOSIS — H9190 Unspecified hearing loss, unspecified ear: Secondary | ICD-10-CM | POA: Diagnosis not present

## 2024-04-15 DIAGNOSIS — Z803 Family history of malignant neoplasm of breast: Secondary | ICD-10-CM

## 2024-04-15 DIAGNOSIS — R1010 Upper abdominal pain, unspecified: Secondary | ICD-10-CM | POA: Diagnosis not present

## 2024-04-15 DIAGNOSIS — K219 Gastro-esophageal reflux disease without esophagitis: Secondary | ICD-10-CM | POA: Diagnosis present

## 2024-04-15 DIAGNOSIS — D701 Agranulocytosis secondary to cancer chemotherapy: Secondary | ICD-10-CM | POA: Diagnosis not present

## 2024-04-15 DIAGNOSIS — D6481 Anemia due to antineoplastic chemotherapy: Secondary | ICD-10-CM | POA: Diagnosis present

## 2024-04-15 DIAGNOSIS — D539 Nutritional anemia, unspecified: Secondary | ICD-10-CM | POA: Diagnosis not present

## 2024-04-15 DIAGNOSIS — Z90710 Acquired absence of both cervix and uterus: Secondary | ICD-10-CM

## 2024-04-15 DIAGNOSIS — C799 Secondary malignant neoplasm of unspecified site: Secondary | ICD-10-CM | POA: Diagnosis not present

## 2024-04-15 DIAGNOSIS — D72829 Elevated white blood cell count, unspecified: Secondary | ICD-10-CM | POA: Diagnosis not present

## 2024-04-15 DIAGNOSIS — C55 Malignant neoplasm of uterus, part unspecified: Secondary | ICD-10-CM | POA: Diagnosis not present

## 2024-04-15 DIAGNOSIS — K449 Diaphragmatic hernia without obstruction or gangrene: Secondary | ICD-10-CM | POA: Diagnosis not present

## 2024-04-15 DIAGNOSIS — R112 Nausea with vomiting, unspecified: Secondary | ICD-10-CM | POA: Diagnosis not present

## 2024-04-15 DIAGNOSIS — Z4682 Encounter for fitting and adjustment of non-vascular catheter: Secondary | ICD-10-CM | POA: Diagnosis not present

## 2024-04-15 DIAGNOSIS — Z170 Estrogen receptor positive status [ER+]: Secondary | ICD-10-CM

## 2024-04-15 LAB — URINALYSIS, ROUTINE W REFLEX MICROSCOPIC
Bilirubin Urine: NEGATIVE
Glucose, UA: NEGATIVE mg/dL
Hgb urine dipstick: NEGATIVE
Ketones, ur: NEGATIVE mg/dL
Leukocytes,Ua: NEGATIVE
Nitrite: NEGATIVE
Protein, ur: NEGATIVE mg/dL
Specific Gravity, Urine: 1.044 — ABNORMAL HIGH (ref 1.005–1.030)
pH: 7 (ref 5.0–8.0)

## 2024-04-15 LAB — I-STAT CHEM 8, ED
BUN: 27 mg/dL — ABNORMAL HIGH (ref 8–23)
Calcium, Ion: 1.06 mmol/L — ABNORMAL LOW (ref 1.15–1.40)
Chloride: 105 mmol/L (ref 98–111)
Creatinine, Ser: 1.4 mg/dL — ABNORMAL HIGH (ref 0.44–1.00)
Glucose, Bld: 132 mg/dL — ABNORMAL HIGH (ref 70–99)
HCT: 33 % — ABNORMAL LOW (ref 36.0–46.0)
Hemoglobin: 11.2 g/dL — ABNORMAL LOW (ref 12.0–15.0)
Potassium: 3.7 mmol/L (ref 3.5–5.1)
Sodium: 138 mmol/L (ref 135–145)
TCO2: 25 mmol/L (ref 22–32)

## 2024-04-15 LAB — COMPREHENSIVE METABOLIC PANEL WITH GFR
ALT: 10 U/L (ref 0–44)
AST: 14 U/L — ABNORMAL LOW (ref 15–41)
Albumin: 3.2 g/dL — ABNORMAL LOW (ref 3.5–5.0)
Alkaline Phosphatase: 65 U/L (ref 38–126)
Anion gap: 11 (ref 5–15)
BUN: 27 mg/dL — ABNORMAL HIGH (ref 8–23)
CO2: 23 mmol/L (ref 22–32)
Calcium: 9.4 mg/dL (ref 8.9–10.3)
Chloride: 105 mmol/L (ref 98–111)
Creatinine, Ser: 1.24 mg/dL — ABNORMAL HIGH (ref 0.44–1.00)
GFR, Estimated: 43 mL/min — ABNORMAL LOW (ref >60)
Glucose, Bld: 137 mg/dL — ABNORMAL HIGH (ref 70–99)
Potassium: 3.8 mmol/L (ref 3.5–5.1)
Sodium: 139 mmol/L (ref 135–145)
Total Bilirubin: 0.6 mg/dL (ref 0.0–1.2)
Total Protein: 5.6 g/dL — ABNORMAL LOW (ref 6.5–8.1)

## 2024-04-15 LAB — CBC WITH DIFFERENTIAL/PLATELET
Abs Immature Granulocytes: 0.01 K/uL (ref 0.00–0.07)
Basophils Absolute: 0 K/uL (ref 0.0–0.1)
Basophils Relative: 0 %
Eosinophils Absolute: 0.1 K/uL (ref 0.0–0.5)
Eosinophils Relative: 2 %
HCT: 34.3 % — ABNORMAL LOW (ref 36.0–46.0)
Hemoglobin: 11.1 g/dL — ABNORMAL LOW (ref 12.0–15.0)
Immature Granulocytes: 0 %
Lymphocytes Relative: 12 %
Lymphs Abs: 0.6 K/uL — ABNORMAL LOW (ref 0.7–4.0)
MCH: 33 pg (ref 26.0–34.0)
MCHC: 32.4 g/dL (ref 30.0–36.0)
MCV: 102.1 fL — ABNORMAL HIGH (ref 80.0–100.0)
Monocytes Absolute: 0.4 K/uL (ref 0.1–1.0)
Monocytes Relative: 7 %
Neutro Abs: 4 K/uL (ref 1.7–7.7)
Neutrophils Relative %: 79 %
Platelets: 220 K/uL (ref 150–400)
RBC: 3.36 MIL/uL — ABNORMAL LOW (ref 3.87–5.11)
RDW: 16.4 % — ABNORMAL HIGH (ref 11.5–15.5)
WBC: 5.1 K/uL (ref 4.0–10.5)
nRBC: 0 % (ref 0.0–0.2)

## 2024-04-15 LAB — I-STAT CG4 LACTIC ACID, ED
Lactic Acid, Venous: 1.7 mmol/L (ref 0.5–1.9)
Lactic Acid, Venous: 1.8 mmol/L (ref 0.5–1.9)

## 2024-04-15 LAB — LIPASE, BLOOD: Lipase: 25 U/L (ref 11–51)

## 2024-04-15 LAB — TROPONIN I (HIGH SENSITIVITY)
Troponin I (High Sensitivity): 6 ng/L (ref <18)
Troponin I (High Sensitivity): 5 ng/L (ref <18)

## 2024-04-15 MED ORDER — ONDANSETRON HCL 4 MG/2ML IJ SOLN
4.0000 mg | Freq: Once | INTRAMUSCULAR | Status: AC
  Administered 2024-04-15: 4 mg via INTRAVENOUS
  Filled 2024-04-15: qty 2

## 2024-04-15 MED ORDER — LACTATED RINGERS IV BOLUS
1000.0000 mL | Freq: Once | INTRAVENOUS | Status: AC
  Administered 2024-04-15: 1000 mL via INTRAVENOUS

## 2024-04-15 MED ORDER — DIATRIZOATE MEGLUMINE & SODIUM 66-10 % PO SOLN
90.0000 mL | Freq: Once | ORAL | Status: AC
  Administered 2024-04-15: 90 mL via NASOGASTRIC
  Filled 2024-04-15: qty 90

## 2024-04-15 MED ORDER — IOHEXOL 350 MG/ML SOLN
75.0000 mL | Freq: Once | INTRAVENOUS | Status: AC | PRN
  Administered 2024-04-15: 75 mL via INTRAVENOUS

## 2024-04-15 MED ORDER — MORPHINE SULFATE (PF) 4 MG/ML IV SOLN
4.0000 mg | Freq: Once | INTRAVENOUS | Status: AC
  Administered 2024-04-15: 4 mg via INTRAVENOUS
  Filled 2024-04-15: qty 1

## 2024-04-15 MED ORDER — ONDANSETRON HCL 4 MG/2ML IJ SOLN: 4.0000 mg | Freq: Four times a day (QID) | INTRAMUSCULAR | Status: DC | PRN

## 2024-04-15 MED ORDER — LACTATED RINGERS IV SOLN: INTRAVENOUS | Status: AC

## 2024-04-15 MED ORDER — HEPARIN SODIUM (PORCINE) 5000 UNIT/ML IJ SOLN
5000.0000 [IU] | Freq: Three times a day (TID) | INTRAMUSCULAR | Status: DC
  Administered 2024-04-15 – 2024-04-17 (×6): 5000 [IU] via SUBCUTANEOUS
  Filled 2024-04-15 (×6): qty 1

## 2024-04-15 MED ORDER — ACETAMINOPHEN 10 MG/ML IV SOLN
1000.0000 mg | Freq: Three times a day (TID) | INTRAVENOUS | Status: AC
  Administered 2024-04-15 – 2024-04-16 (×3): 1000 mg via INTRAVENOUS
  Filled 2024-04-15 (×3): qty 100

## 2024-04-15 NOTE — ED Notes (Signed)
 Suction restarted. Set to low intermittent suction per order.

## 2024-04-15 NOTE — Plan of Care (Signed)

## 2024-04-15 NOTE — H&P (Cosign Needed Addendum)
 Date: 04/15/2024               Patient Name:  ARDINE IACOVELLI MRN: [id number]  DOB: 1941-02-21 Age / Sex: 83 y.o., female   PCP: Cleotilde, [place]  E, PA         Medical Service: Internal Medicine Teaching Service         Attending Physician: Dr. Mliss Pouch      First Contact: Remonia Romano, DO    Second Contact: Dr. Roetta Chars, MD          Pager Information: First Contact Pager: [phone]   Second Contact Pager: [phone]   SUBJECTIVE   Chief Complaint: Epigastric abdominal pain, nausea/vomiting  History of Present Illness: MINDEE ROBLEDO is a 83 y.o. female with PMH of metastatic stage IIIc uterine serous carcinoma (on cycle #4 chemo), breast cancer s/p left lumpectomy(2017), hypothyroidism presented to the ED with acute onset cramping epigastric pain, persistent nausea and vomiting.  Says the episode felt similar to bowel obstruction  back in 2020.  She is passing gas, last bowel movement was yesterday, was not hard stools.  Denies fevers, chest pain, abdominal distention, or bloating.  Of note, she has a history of robotic hysterectomy in 2018 for uterine cancer.  In 2024, she had a recurrence of the uterine cancer, now metastatic, now undergoing chemotherapy.   ED Course: Labs significant for: Hb 11, normal WBC.  SCR 1.24, GFR 43 Imaging: High-grade small bowel obstruction with transition point in the pelvis Received: IV Zofran , and 1 L of IV fluids Consulted: General surgery.  Past Medical History Breast cancer Uterine serous carcinoma Hypertension Hypothyroidism   Past Surgical History Past Surgical History:  Procedure Laterality Date   BREAST BIOPSY Left 11/06/2015   benign   BREAST BIOPSY Left 10/31/2015   malignant   BREAST BIOPSY Left 10/31/2015   benign   BREAST BIOPSY Left 10/15/2013   benign   BREAST LUMPECTOMY Left    herniated disc     RADIOACTIVE SEED GUIDED PARTIAL MASTECTOMY WITH AXILLARY SENTINEL LYMPH NODE BIOPSY Left 11/27/2015   Procedure: LEFT  BREAST SEED AND WIRE GUIDED LUMPECTOMY WITH LEFT AXILLARY LYMPH NODE BIOPSY;  Surgeon: Donnice Bury, MD;  Location: [facility];  Service: General;  Laterality: Left;   ROBOTIC ASSISTED TOTAL HYSTERECTOMY WITH BILATERAL SALPINGO OOPHERECTOMY  04/01/2017   ROBOTIC HYSTERECTOMY WITH BSO, SENTINEL NODE MAPPING AND D&C by Dr. Arlee   TONSILLECTOMY      Meds:  Current Meds  Medication Sig   cholecalciferol (VITAMIN D) 1000 units tablet Take 1,000 Units by mouth daily.   [employer] MAGNESIUM PO Take 1 capsule by mouth daily at 12 noon.   escitalopram (LEXAPRO) 20 MG tablet Take 20 mg by mouth daily.    Ferrous Sulfate (IRON PO) Take 1 capsule by mouth daily at 12 noon.   levothyroxine (SYNTHROID) 112 MCG tablet Take 112 mcg by mouth daily before breakfast.   Social:  Lives by herself in [place].  Independent of ADLs and IADLs.  No history of cigarette, or other recreational drug use. PCP:  Cleotilde, [place]  E, PA   Family History:  Family History  Problem Relation Age of Onset   Brain cancer Sister    Ovarian cancer Maternal Aunt    Breast cancer Other 40     Allergies: Allergies as of 04/15/2024   (No Known Allergies)    Review of Systems: A complete ROS was negative except as per HPI.   OBJECTIVE:  Physical Exam: Blood pressure (!) 155/83, pulse 66, temperature (!) 97.4 F (36.3 C), temperature source Oral, resp. rate 17, height 5' 6 (1.676 m), weight 70.3 kg, SpO2 100%.    Constitutional:  lying in bed, not in acute distress.  Hard of hearing. HENT: normocephalic atraumatic, mucous membranes moist Cardiovascular: regular rate and rhythm, no m/r/g Pulmonary/Chest: normal work of breathing on room air, lungs clear to auscultation bilaterally Abdominal: soft, mildly tender in the midepigastrium to deep palpation, decreased bowel sounds. MSK: normal bulk and tone Neurological: alert & oriented x 3, 5/5 strength in bilateral upper and lower extremities,  normal gait Skin: warm and dry Psych: Mood and affect appropriate.  Labs: CBC    Component Value Date/Time   WBC 5.1 04/15/2024 0355   RBC 3.36 (L) 04/15/2024 0355   HGB 11.2 (L) 04/15/2024 0425   HGB 11.3 (L) 05/14/2021 0930   HGB 11.5 (L) 09/09/2017 1020   HCT 33.0 (L) 04/15/2024 0425   HCT 36.2 09/09/2017 1020   PLT 220 04/15/2024 0355   PLT 216 05/14/2021 0930   PLT 115 (L) 09/09/2017 1020   MCV 102.1 (H) 04/15/2024 0355   MCV 102.5 (H) 09/09/2017 1020   MCH 33.0 04/15/2024 0355   MCHC 32.4 04/15/2024 0355   RDW 16.4 (H) 04/15/2024 0355   RDW 14.7 (H) 09/09/2017 1020   LYMPHSABS 0.6 (L) 04/15/2024 0355   LYMPHSABS 1.2 09/09/2017 1020   MONOABS 0.4 04/15/2024 0355   MONOABS 0.4 09/09/2017 1020   EOSABS 0.1 04/15/2024 0355   EOSABS 0.1 09/09/2017 1020   BASOSABS 0.0 04/15/2024 0355   BASOSABS 0.0 09/09/2017 1020     CMP     Component Value Date/Time   NA 138 04/15/2024 0425   NA 139 09/09/2017 1020   K 3.7 04/15/2024 0425   K 4.3 09/09/2017 1020   CL 105 04/15/2024 0425   CO2 23 04/15/2024 0355   CO2 26 09/09/2017 1020   GLUCOSE 132 (H) 04/15/2024 0425   GLUCOSE 93 09/09/2017 1020   BUN 27 (H) 04/15/2024 0425   BUN 11.0 09/09/2017 1020   CREATININE 1.40 (H) 04/15/2024 0425   CREATININE 1.04 (H) 05/14/2021 0930   CREATININE 0.9 09/09/2017 1020   CALCIUM 9.4 04/15/2024 0355   CALCIUM 9.0 09/09/2017 1020   PROT 5.6 (L) 04/15/2024 0355   PROT 6.6 06/30/2017 1048   ALBUMIN 3.2 (L) 04/15/2024 0355   ALBUMIN 3.7 06/30/2017 1048   AST 14 (L) 04/15/2024 0355   AST 15 05/14/2021 0930   AST 19 06/30/2017 1048   ALT 10 04/15/2024 0355   ALT 14 05/14/2021 0930   ALT 17 06/30/2017 1048   ALKPHOS 65 04/15/2024 0355   ALKPHOS 73 06/30/2017 1048   BILITOT 0.6 04/15/2024 0355   BILITOT 0.3 05/14/2021 0930   BILITOT 0.44 06/30/2017 1048   GFRNONAA 43 (L) 04/15/2024 0355   GFRNONAA 54 (L) 05/14/2021 0930   GFRAA 51 (L) 04/17/2020 0948    Imaging:  DG Abd  Portable 1V-Small Bowel Protocol-Position Verification Result Date: 04/15/2024 CLINICAL DATA:  Nasogastric tube placement EXAM: PORTABLE ABDOMEN - 1 VIEW COMPARISON:  CT scan 04/15/2024 FINDINGS: None moderate hiatal hernia. Nasogastric tube side port is in the stomach body with tip in the vicinity of the stomach fundus. Contrast medium left over from the patient's CT scan is present in both renal collecting systems. The pelvis was excluded from imaging. No definite dilated gas-filled upper abdominal loops of small bowel are identified, although mildly dilated small  bowel loops were present on the CT scan obtained at 5:13 a.m. IMPRESSION: 1. Nasogastric tube side port is in the stomach body with tip in the vicinity of the stomach fundus. 2. Moderate hiatal hernia. Electronically Signed   By: Ryan Salvage M.D.   On: 04/15/2024 09:35   CT ABDOMEN PELVIS W CONTRAST Result Date: 04/15/2024 CLINICAL DATA:  Nonlocalized epigastric pain and cramping. Undergoing chemotherapy for recurring uterine cancer. EXAM: CT ABDOMEN AND PELVIS WITH CONTRAST TECHNIQUE: Multidetector CT imaging of the abdomen and pelvis was performed using the standard protocol following bolus administration of intravenous contrast. RADIATION DOSE REDUCTION: This exam was performed according to the departmental dose-optimization program which includes automated exposure control, adjustment of the mA and/or kV according to patient size and/or use of iterative reconstruction technique. CONTRAST:  75mL OMNIPAQUE  IOHEXOL  350 MG/ML SOLN COMPARISON:  CT with IV contrast 10/08/2019, CT without contrast 04/26/2008. FINDINGS: Lower chest: There is moderate calcification in the lower outer mitral ring. Scattered coronary artery calcifications. The cardiac size is normal. There is a moderate-sized hiatal hernia, increased since 2021. There is a thickened wall in the distal thoracic esophagus. Endoscopic follow-up may be indicated but the findings may  simply be due to reflux esophagitis. There are linear scar-like opacities in both lower lobes and a calcified granuloma in the right lower lobe. Lung bases are otherwise clear. Hepatobiliary: There are multiple scattered small hepatic cysts, not significantly changed. No new liver abnormality or mass enhancement. Gallbladder and bile ducts are unremarkable. Pancreas: No abnormality. Spleen: Occasional calcified granulomas.  No other abnormality. Adrenals/Urinary Tract: No adrenal mass. There is symmetric renal excretion. There are Bosniak 1 right renal cysts, in the extreme upper pole measuring 2.1 cm, 10 Hounsfield units, posteriorly in the upper to midpole measuring 6 cm, 11 Hounsfield units. There are additional Bosniak 2 subcentimeter bilateral cysts which are too small to characterize. No follow-up imaging is recommended. There is no urinary stone or obstruction. Stomach/Bowel: The stomach is contracted. Beginning in the jejunum there is small bowel dilatation up to 3.9 cm extending into the pelvis, where there is a feces filled distal ileal segment along the posterior left side wall just above a thick-walled transitional segment with apparent kinking and angulation anterior to the rectum on 3: 71-77. Distal to this the remaining ileum is collapsed, consistent with a high-grade obstruction. No bowel pneumatosis is seen. There is a normal caliber appendix. The colon wall is normal thickness with uncomplicated diverticulosis heaviest in the sigmoid. Vascular/Lymphatic: No significant vascular findings are present. No enlarged abdominal or pelvic lymph nodes. Reproductive: Status post hysterectomy. No adnexal masses. There is mild pelvic floor laxity. Other: Trace ascites in the pelvis. Minimal perihepatic ascites. No free air, free hemorrhage or incarcerated hernia. There is a wide mouth left inguinal fat hernia. Small umbilical and supraumbilical fat hernias. Musculoskeletal: There is osteopenia, degenerative  change and mild levoscoliosis of the lumbar spine., and chronic fractures of the bilateral sacrum and right pubic bone. No acute or other significant osseous findings. Asymmetrically advanced left hip DJD. IMPRESSION: 1. High-grade small bowel obstruction with transition point in the pelvis, where there is a thick-walled transitional segment with apparent kinking and angulation anterior to the rectum. Surgery consult recommended. 2. Trace ascites. No free air or bowel pneumatosis. 3. Moderate-sized hiatal hernia, increased since 2021, with thickened wall in the distal thoracic esophagus. Endoscopic follow-up may be indicated but the findings may simply be due to reflux esophagitis. 4. Aortic and coronary artery atherosclerosis.  5. Diverticulosis without evidence of diverticulitis. 6. Osteopenia, degenerative change and scoliosis. 7. Chronic fractures of the bilateral sacrum and right pubic bone. 8. Small umbilical, supraumbilical, and left inguinal fat hernias. Aortic Atherosclerosis (ICD10-I70.0). Electronically Signed   By: Francis Quam M.D.   On: 04/15/2024 06:37     EKG: personally reviewed my interpretation is normal sinus rhythm, left axis devation.   ASSESSMENT & PLAN:   Assessment & Plan by Problem: Principal Problem:   SBO (small bowel obstruction) (HCC) Active Problems:   Malignant neoplasm of lower-outer quadrant of left breast of female, estrogen receptor positive (HCC)   CKD stage 3b, GFR 30-44 ml/min (HCC)   MALAJAH OCEGUERA is a 83 y.o. with a past medical history of breast cancer s/p chemotherapy, stage IIIc uterine serous carcinoma of the uterus( treated surgically in 2018) who presented with abdominal pain, and persistent nausea/vomiting, and admitted for small bowel obstruction  # Small bowel obstruction # Abdominal pain Presented with epigastric pain, persistent N/V.  She is afebrile, no leukocytosis, normal lipase.  CT abdomen and pelvis showed high-grade small bowel  obstruction. Exam, she is nontoxic-appearing, epigastrium tender to palpation, otherwise no peritoneal signs.  She is stable electrolytes.  She has risk factors for SBO including prior abdominal surgery, and metastatic uterine cancer.  General surgery consulted, recommending supportive treatment for now.  She has an NG tube placed, follow-up x-ray showed the absence of dilated gas-filled abdominal loops, as previously identified on CT   - General Surgery following appreciate recs. - N.p.o. - NG tube to suction - Strict I's and O's - LR maintenance fluids@125  mL/h - Monitor electrolytes  # CKD stage IIIb SCr mildly increased about her baseline, likely prerenal from decreased p.o. intake.  Intrarenal and postrenal causes unlikely based on history and labs.  - Avoid nephrotoxic drugs - BMP as above. - Maintenance fluids as above.  #  Metastatic Uterine serous carcinoma Surgically treated in 2018, with robotic hysterectomy, and sentinel lymph node biopsy.  She was on chemo therapy between 2018-2021.  PET scan in June 2024 showed recurrent disease, thus was started on chemotherapy.  She has completed 4 cycles of trastuzumab  deruxtecan, and a follow-up PET in June 2025 shows decreased activity in the retroperitoneal abdominal lymph nodes.  She follows with [facility] for her cancer care, follows with Dr. Arlee MD.  -Follow-up with [facility] for cancer care.  # Macrocytic anemia Hb 11, MCV 102.  Her baseline Hb has been between 11-12. Her recent vitamin b12(2024); was low. -Will check folate and vitamin B12.  Chronic medical conditions  Anxiety/depression : Hold Lexapro 20 mg., due to NPO status Hypothyroidism: Hold levothyroxine 112 mcg,due to NPO status GERD: Hold pantoprazole 20 mg, due to NPO status.  Best practice: Diet: NPO VTE: Heparin  IVF: LR,125cc/hr Code: Full  Disposition planning: Prior to Admission Living Arrangement: Home, living by herself. Anticipated Discharge  Location: Pending PT/OT treatment eval Barriers to Discharge: Medical stabilization.  Dispo: Admit patient to Observation with expected length of stay less than 2 midnights.  Signed:  Jansel Vonstein, M.D.  Internal Medicine Resident, PGY-2 4:22 PM, 04/15/2024   Please contact IM Residency On-Call Pager at: [phone] or [phone].

## 2024-04-15 NOTE — Hospital Course (Addendum)
#   Small bowel obstruction # Abdominal pain Presented with epigastric pain, persistent nausea and vomiting.  She was afebrile, no leukocytosis, normal lipase, normal lactic acid.  CT abdomen and pelvis showed high-grade small bowel obstruction. She has risk factors for SBO including prior abdominal surgery, and metastatic uterine cancer. General surgery consulted, and recommended conservative measures with NG tube and NPO as she was not peritonitic. She had an NG tube placed, follow-up x-ray showed the absence of dilated gas-filled abdominal loops, as previously identified on CT. Second x-ray 8 hours later showed that the PO contrast had reached the rectum. Her diet was advanced on hospital day 2 with no nausea and vomiting or abdominal pain. She had multiple bowel movements.   # CKD stage IIIb Creatinine mildly increased from her baseline of 1.0-1.2 to 1.40 on admission, likely prerenal from decreased p.o. intake.  Creatinine improved to 1.16 after fluid administration and was 1.10 on the morning of discharge    # Metastatic Uterine serous carcinoma # Leukopenia Surgically treated in 2018, with robotic hysterectomy, and sentinel lymph node biopsy.  She was on chemo therapy between 2018-2021.  PET scan in June 2024 showed recurrent disease, thus was started on chemotherapy.  She has completed 4 cycles of trastuzumab  deruxtecan, and a follow-up PET in June 2025 shows decreased activity in the retroperitoneal abdominal lymph nodes.  She follows with [facility] for her cancer care, follows with Dr. Arlee MD.  Leukopenia (3.3 on day of discharge) likely low in the setting of chemotherapy.   # Macrocytic anemia Hgb 11.1, MCV 102 on admission.  Her baseline Hgb has been between 11-12. B12 and Folate normal. Hgb 9.4 ton 7/25 decreased from 11.2, possibly dilutional given drop in other cell lines concurrent to Hgb drop. No signs of bleeding. Macrocytic anemia likely due to her chemotherapy. Hgb 10.2 on day  of discharge   #Hypothyroidism PO Levothyroxine  held during NPO and restarted before discharge.   #Depression PO Lexapro  held during NPO and restarted before discharge, consider reducing dose at follow-up given her age.

## 2024-04-15 NOTE — ED Triage Notes (Signed)
 Pt BIB EMS from home. EMS reports upper abdominal pain since 9pm, nausea and vomiting. Reports similar episode roughly 5 yrs ago.  Hx endometrial cancer, currently receiving chemo   EMS admin 4mg  zofran  EMS VS 130/74, HR 74, RR 20, 99% RA, cbg 153

## 2024-04-15 NOTE — ED Notes (Addendum)
 NG tube suction applied, low intermittent. Gastric contents observed.

## 2024-04-15 NOTE — ED Notes (Signed)
 NG tube cleared to be placed on low intermittent suction post XR results by admitting providers at bedside.

## 2024-04-15 NOTE — Consult Note (Signed)
 Autumn Frazier [birth year]  [id number].    Requesting Provider: Norleen Essex, PA-C Chief Complaint/Reason for Consult: Abdominal pain  HPI: Autumn Frazier is a 83 y.o. female with past medical history of endometrial cancer and breast cancer who presents to [facility] ED overnight with complaints of crampy intermittent abdominal pain associated with nausea and vomiting that started around 8 PM last night.  Patient is currently receiving radiation therapy at [facility] for endometrial cancer.  Patient stated she has experienced similar symptoms the past about 5 years ago and was self-limited. By patient's account, she stated that she experienced abdominal pain in the upper abdomen that was shortly followed by intractable nausea and vomiting.  She then decided to take a visit to the emergency department for further workup of her symptoms.  She denies diarrhea and/or constipation and stated that her last bowel movement was yesterday morning which was normal.  Her last meal was last evening at around 7 PM. Patient endorses having a total hysterectomy about 6 years ago, she denies taking any blood thinners and also denies the use of tobacco product.  She states that she consumes alcohol very infrequently and that the last time she had a drink was about 6 months ago.  She denies use of illicit substances.  ROS: All systems negative except for HPI  Family History  Problem Relation Age of Onset   Brain cancer Sister    Ovarian cancer Maternal Aunt    Breast cancer Other 65    Past Medical History:  Diagnosis Date   Breast cancer (HCC)    Breast cancer of lower-outer quadrant of left female breast (HCC) 11/02/2015   Endometrial cancer (HCC)    History of brachytherapy 08/27/17-09/11/17   vaginal cuff 18 Gy in 3 fractions   History of radiation therapy 07/14/2017-08/19/17   pelvis 45 Gy in 25 fractions   Hx of radiation therapy 01/09/16- 02/06/16   Left Breast   Personal history of chemotherapy  04/2017   for uterine ca   Personal history of radiation therapy 2017   for breast ca   Personal history of radiation therapy 2018   for uterine ca    Past Surgical History:  Procedure Laterality Date   BREAST BIOPSY Left 11/06/2015   benign   BREAST BIOPSY Left 10/31/2015   malignant   BREAST BIOPSY Left 10/31/2015   benign   BREAST BIOPSY Left 10/15/2013   benign   BREAST LUMPECTOMY Left    herniated disc     RADIOACTIVE SEED GUIDED PARTIAL MASTECTOMY WITH AXILLARY SENTINEL LYMPH NODE BIOPSY Left 11/27/2015   Procedure: LEFT BREAST SEED AND WIRE GUIDED LUMPECTOMY WITH LEFT AXILLARY LYMPH NODE BIOPSY;  Surgeon: Donnice Bury, MD;  Location: [facility];  Service: General;  Laterality: Left;   ROBOTIC ASSISTED TOTAL HYSTERECTOMY WITH BILATERAL SALPINGO OOPHERECTOMY  04/01/2017   ROBOTIC HYSTERECTOMY WITH BSO, SENTINEL NODE MAPPING AND D&C by Dr. Arlee   TONSILLECTOMY      Social History:  reports that she quit smoking about 41 years ago. Her smoking use included cigarettes. She has never used smokeless tobacco. She reports current alcohol use of about 3.0 standard drinks of alcohol per week. She reports that she does not use drugs.  Allergies: No Known Allergies  (Not in a hospital admission)    Physical Exam: Blood pressure (!) 155/83, pulse 66, temperature (!) 97.4 F (36.3 C), temperature source Oral, resp. rate 17, height 5' 6 (1.676 m), weight  70.3 kg, SpO2 100%.  Constitutional:      General: She is not in acute distress.    Appearance: She is not toxic-appearing.  Cardiovascular:     Rate and Rhythm: Normal rate.  Pulmonary:     Effort: Pulmonary effort is normal.  Abdominal:     General: Bowel sounds are decreased. There is distension.     Palpations: Abdomen is soft.     Tenderness: There is abdominal tenderness in the epigastric area. There is no guarding or rebound.     Hernia: No hernia is present.     Comments: Minimal TTP in  epigastric region   Skin:    General: Skin is warm and dry.  Neurological:     General: No focal deficit present.     Mental Status: She is alert and oriented to person, place, and time.    Results for orders placed or performed during the hospital encounter of 04/15/24 (from the past 48 hours)  CBC with Differential     Status: Abnormal   Collection Time: 04/15/24  3:55 AM  Result Value Ref Range   WBC 5.1 4.0 - 10.5 K/uL   RBC 3.36 (L) 3.87 - 5.11 MIL/uL   Hemoglobin 11.1 (L) 12.0 - 15.0 g/dL   HCT 65.6 (L) 63.9 - 53.9 %   MCV 102.1 (H) 80.0 - 100.0 fL   MCH 33.0 26.0 - 34.0 pg   MCHC 32.4 30.0 - 36.0 g/dL   RDW 83.5 (H) 88.4 - 84.4 %   Platelets 220 150 - 400 K/uL   nRBC 0.0 0.0 - 0.2 %   Neutrophils Relative % 79 %   Neutro Abs 4.0 1.7 - 7.7 K/uL   Lymphocytes Relative 12 %   Lymphs Abs 0.6 (L) 0.7 - 4.0 K/uL   Monocytes Relative 7 %   Monocytes Absolute 0.4 0.1 - 1.0 K/uL   Eosinophils Relative 2 %   Eosinophils Absolute 0.1 0.0 - 0.5 K/uL   Basophils Relative 0 %   Basophils Absolute 0.0 0.0 - 0.1 K/uL   Immature Granulocytes 0 %   Abs Immature Granulocytes 0.01 0.00 - 0.07 K/uL    Comment: Performed at [facility], [address], [place], [place] [address]  Comprehensive metabolic panel     Status: Abnormal   Collection Time: 04/15/24  3:55 AM  Result Value Ref Range   Sodium 139 135 - 145 mmol/L   Potassium 3.8 3.5 - 5.1 mmol/L   Chloride 105 98 - 111 mmol/L   CO2 23 22 - 32 mmol/L   Glucose, Bld 137 (H) 70 - 99 mg/dL    Comment: Glucose reference range applies only to samples taken after fasting for at least 8 hours.   BUN 27 (H) 8 - 23 mg/dL   Creatinine, Ser 8.75 (H) 0.44 - 1.00 mg/dL   Calcium 9.4 8.9 - 89.6 mg/dL   Total Protein 5.6 (L) 6.5 - 8.1 g/dL   Albumin 3.2 (L) 3.5 - 5.0 g/dL   AST 14 (L) 15 - 41 U/L   ALT 10 0 - 44 U/L   Alkaline Phosphatase 65 38 - 126 U/L   Total Bilirubin 0.6 0.0 - 1.2 mg/dL   GFR, Estimated 43 (L) >60 mL/min     Comment: (NOTE) Calculated using the CKD-EPI Creatinine Equation (2021)    Anion gap 11 5 - 15    Comment: Performed at [facility], [address] [place], [place] [address]  Troponin  I (High Sensitivity)     Status: None   Collection Time: 04/15/24  3:55 AM  Result Value Ref Range   Troponin I (High Sensitivity) 6 <18 ng/L    Comment: (NOTE) Elevated high sensitivity troponin I (hsTnI) values and significant  changes across serial measurements may suggest ACS but many other  chronic and acute conditions are known to elevate hsTnI results.  Refer to the Links section for chest pain algorithms and additional  guidance. Performed at [facility], [address], [place], [place] [address]   Lipase, blood     Status: None   Collection Time: 04/15/24  3:55 AM  Result Value Ref Range   Lipase 25 11 - 51 U/L    Comment: Performed at [facility], [address], [place], [place] [address]  I-stat chem 8, ED (not at [facility], [facility] or [facility])     Status: Abnormal   Collection Time: 04/15/24  4:25 AM  Result Value Ref Range   Sodium 138 135 - 145 mmol/L   Potassium 3.7 3.5 - 5.1 mmol/L   Chloride 105 98 - 111 mmol/L   BUN 27 (H) 8 - 23 mg/dL   Creatinine, Ser 8.59 (H) 0.44 - 1.00 mg/dL   Glucose, Bld 867 (H) 70 - 99 mg/dL    Comment: Glucose reference range applies only to samples taken after fasting for at least 8 hours.   Calcium, Ion 1.06 (L) 1.15 - 1.40 mmol/L   TCO2 25 22 - 32 mmol/L   Hemoglobin 11.2 (L) 12.0 - 15.0 g/dL   HCT 66.9 (L) 63.9 - 53.9 %  I-Stat CG4 Lactic Acid     Status: None   Collection Time: 04/15/24  4:25 AM  Result Value Ref Range   Lactic Acid, Venous 1.7 0.5 - 1.9 mmol/L  I-Stat CG4 Lactic Acid     Status: None   Collection Time: 04/15/24  6:10 AM  Result Value Ref Range   Lactic Acid, Venous 1.8 0.5 - 1.9 mmol/L  Troponin I (High Sensitivity)     Status: None   Collection Time: 04/15/24  6:20 AM  Result Value Ref Range   Troponin I  (High Sensitivity) 5 <18 ng/L    Comment: (NOTE) Elevated high sensitivity troponin I (hsTnI) values and significant  changes across serial measurements may suggest ACS but many other  chronic and acute conditions are known to elevate hsTnI results.  Refer to the Links section for chest pain algorithms and additional  guidance. Performed at [facility], [address] [place], [place] [address]    CT ABDOMEN PELVIS W CONTRAST Result Date: 04/15/2024 CLINICAL DATA:  Nonlocalized epigastric pain and cramping. Undergoing chemotherapy for recurring uterine cancer. EXAM: CT ABDOMEN AND PELVIS WITH CONTRAST TECHNIQUE: Multidetector CT imaging of the abdomen and pelvis was performed using the standard protocol following bolus administration of intravenous contrast. RADIATION DOSE REDUCTION: This exam was performed according to the departmental dose-optimization program which includes automated exposure control, adjustment of the mA and/or kV according to patient size and/or use of iterative reconstruction technique. CONTRAST:  75mL OMNIPAQUE  IOHEXOL  350 MG/ML SOLN COMPARISON:  CT with IV contrast 10/08/2019, CT without contrast 04/26/2008. FINDINGS: Lower chest: There is moderate calcification in the lower outer mitral ring. Scattered coronary artery calcifications. The cardiac size is normal. There is a moderate-sized hiatal hernia, increased since 2021. There is a thickened wall in the distal thoracic esophagus. Endoscopic follow-up may be indicated but the findings  may simply be due to reflux esophagitis. There are linear scar-like opacities in both lower lobes and a calcified granuloma in the right lower lobe. Lung bases are otherwise clear. Hepatobiliary: There are multiple scattered small hepatic cysts, not significantly changed. No new liver abnormality or mass enhancement. Gallbladder and bile ducts are unremarkable. Pancreas: No abnormality. Spleen: Occasional calcified granulomas.  No other  abnormality. Adrenals/Urinary Tract: No adrenal mass. There is symmetric renal excretion. There are Bosniak 1 right renal cysts, in the extreme upper pole measuring 2.1 cm, 10 Hounsfield units, posteriorly in the upper to midpole measuring 6 cm, 11 Hounsfield units. There are additional Bosniak 2 subcentimeter bilateral cysts which are too small to characterize. No follow-up imaging is recommended. There is no urinary stone or obstruction. Stomach/Bowel: The stomach is contracted. Beginning in the jejunum there is small bowel dilatation up to 3.9 cm extending into the pelvis, where there is a feces filled distal ileal segment along the posterior left side wall just above a thick-walled transitional segment with apparent kinking and angulation anterior to the rectum on 3: 71-77. Distal to this the remaining ileum is collapsed, consistent with a high-grade obstruction. No bowel pneumatosis is seen. There is a normal caliber appendix. The colon wall is normal thickness with uncomplicated diverticulosis heaviest in the sigmoid. Vascular/Lymphatic: No significant vascular findings are present. No enlarged abdominal or pelvic lymph nodes. Reproductive: Status post hysterectomy. No adnexal masses. There is mild pelvic floor laxity. Other: Trace ascites in the pelvis. Minimal perihepatic ascites. No free air, free hemorrhage or incarcerated hernia. There is a wide mouth left inguinal fat hernia. Small umbilical and supraumbilical fat hernias. Musculoskeletal: There is osteopenia, degenerative change and mild levoscoliosis of the lumbar spine., and chronic fractures of the bilateral sacrum and right pubic bone. No acute or other significant osseous findings. Asymmetrically advanced left hip DJD. IMPRESSION: 1. High-grade small bowel obstruction with transition point in the pelvis, where there is a thick-walled transitional segment with apparent kinking and angulation anterior to the rectum. Surgery consult recommended. 2.  Trace ascites. No free air or bowel pneumatosis. 3. Moderate-sized hiatal hernia, increased since 2021, with thickened wall in the distal thoracic esophagus. Endoscopic follow-up may be indicated but the findings may simply be due to reflux esophagitis. 4. Aortic and coronary artery atherosclerosis. 5. Diverticulosis without evidence of diverticulitis. 6. Osteopenia, degenerative change and scoliosis. 7. Chronic fractures of the bilateral sacrum and right pubic bone. 8. Small umbilical, supraumbilical, and left inguinal fat hernias. Aortic Atherosclerosis (ICD10-I70.0). Electronically Signed   By: Francis Quam M.D.   On: 04/15/2024 06:37    Anti-infectives (From admission, onward)    None       Assessment/Plan SBO  -CT abd shows- High-grade small bowel obstruction with transition point in the pelvis, where there is a thick-walled transitional segment with apparent kinking and angulation anterior to the rectum. -WBC WNL -Based on P/E, patient history and imaging, patient is displaying symptoms consistent with SBO. Patient had robotic assisted total hysterectomy and is currently undergoing radiation therapy, a combination that increases the risk of adhesions leading to bowl obstruction.  -NGT to [facility] for decompression is warranted then initiation of SBO protocol for further w/u .  -Immediate surgical intervention is not needed at this time. Will consult with my attending.   -Continue to monitor for ROBF.   FEN - NPO, NGT to LIWS VTE - SCD's  ID - Not indicated Dispo - Admit to [facility].   I reviewed nursing notes,  ED provider notes, hospitalist notes, last 24 h vitals and pain scores, last 48 h intake and output, last 24 h labs and trends, and last 24 h imaging results.   Eulah Hammonds, [facility] 04/15/2024, 9:23 AM Please see Amion for pager number during day hours 7:00am-4:30pm

## 2024-04-15 NOTE — ED Provider Notes (Signed)
 [place] EMERGENCY DEPARTMENT AT [facility] Provider Note   CSN: [id number] Arrival date & time: 04/15/24  9660     Patient presents with: Abdominal Pain   Autumn Frazier is a 83 y.o. female with history of endometrial/uterine cancer currently undergoing chemotherapy, hypothyroidism presents emerged from today for evaluation of epigastric cramping.  History today around 2000 the patient started having epigastric cramping sensation after eating peanuts around 1900.  She reports that this felt similar to the episode she had few years ago.  She denies any dysuria, hematuria, diarrhea, or constipation.  Reports that she is been having some nausea and vomiting.  No medication taken prior to arrival.  She is on Synthroid and Lexapro.  No known drug allergies.   Abdominal Pain Associated symptoms: nausea and vomiting   Associated symptoms: no chest pain, no chills, no constipation, no diarrhea, no dysuria, no fever, no hematuria and no shortness of breath        Prior to Admission medications   Medication Sig Start Date End Date Taking? Authorizing Provider  acetaminophen  (TYLENOL ) 500 MG tablet Take 1,000 mg by mouth every 6 (six) hours as needed for moderate pain.  Patient not taking: Reported on 07/26/2022    [provider]  calcium carbonate (OS-CAL) 600 MG TABS tablet Take 600 mg by mouth 2 (two) times daily with a meal.     [provider]  cholecalciferol (VITAMIN D) 1000 units tablet Take 1,000 Units by mouth daily.    [provider]  escitalopram (LEXAPRO) 20 MG tablet Take 20 mg by mouth daily.  11/07/17   [provider]  HYDROcodone -acetaminophen  (NORCO/VICODIN) 5-325 MG tablet Take 1-2 tablets by mouth every 6 (six) hours as needed. Patient not taking: Reported on 03/20/2020 10/09/19   Butler, Michael C, MD  levothyroxine (SYNTHROID, LEVOTHROID) 88 MCG tablet Take 88 mcg by mouth daily before breakfast.  01/15/18   [provider]  ondansetron  (ZOFRAN ) 4 MG tablet Take 1 tablet (4 mg total) by mouth every 8 (eight) hours as needed for nausea or vomiting. Patient not taking: Reported on 03/20/2020 09/27/19   Shannon Agent, MD  Zinc 50 MG TABS Take 50 mg by mouth daily.    [provider]    Allergies: Patient has no known allergies.    Review of Systems  Constitutional:  Negative for chills and fever.  Respiratory:  Negative for shortness of breath.   Cardiovascular:  Negative for chest pain.  Gastrointestinal:  Positive for abdominal pain, nausea and vomiting. Negative for constipation and diarrhea.  Genitourinary:  Negative for dysuria and hematuria.  Neurological:  Negative for light-headedness.    Updated Vital Signs BP 129/77 (BP Location: Right Arm)   Pulse 72   Temp 98 F (36.7 C) (Oral)   Resp 15   Ht 5' 6 (1.676 m)   Wt 70.3 kg   SpO2 100%   BMI 25.02 kg/m   Physical Exam Constitutional:      General: She is not in acute distress.    Appearance: She is not ill-appearing or toxic-appearing.  Cardiovascular:     Rate and Rhythm: Normal rate.  Pulmonary:     Effort: Pulmonary effort is normal. No respiratory distress.  Abdominal:     Palpations: Abdomen is soft.     Tenderness: There is abdominal tenderness in the epigastric area. There is no guarding or rebound.  Skin:    General: Skin is warm and dry.  Neurological:  Mental Status: She is alert.     (all labs ordered are listed, but only abnormal results are displayed) Labs Reviewed  CBC WITH DIFFERENTIAL/PLATELET - Abnormal; Notable for the following components:      Result Value   RBC 3.36 (*)    Hemoglobin 11.1 (*)    HCT 34.3 (*)    MCV 102.1 (*)    RDW 16.4 (*)    Lymphs Abs 0.6 (*)    All other components within normal limits  I-STAT CHEM 8, ED - Abnormal; Notable for the following components:   BUN 27 (*)    Creatinine, Ser 1.40 (*)    Glucose, Bld 132 (*)    Calcium, Ion 1.06 (*)    Hemoglobin 11.2 (*)     HCT 33.0 (*)    All other components within normal limits  COMPREHENSIVE METABOLIC PANEL WITH GFR  LIPASE, BLOOD  URINALYSIS, ROUTINE W REFLEX MICROSCOPIC  I-STAT CG4 LACTIC ACID, ED  TROPONIN I (HIGH SENSITIVITY)    EKG: None  Radiology: CT ABDOMEN PELVIS W CONTRAST Result Date: 04/15/2024 CLINICAL DATA:  Nonlocalized epigastric pain and cramping. Undergoing chemotherapy for recurring uterine cancer. EXAM: CT ABDOMEN AND PELVIS WITH CONTRAST TECHNIQUE: Multidetector CT imaging of the abdomen and pelvis was performed using the standard protocol following bolus administration of intravenous contrast. RADIATION DOSE REDUCTION: This exam was performed according to the departmental dose-optimization program which includes automated exposure control, adjustment of the mA and/or kV according to patient size and/or use of iterative reconstruction technique. CONTRAST:  75mL OMNIPAQUE  IOHEXOL  350 MG/ML SOLN COMPARISON:  CT with IV contrast 10/08/2019, CT without contrast 04/26/2008. FINDINGS: Lower chest: There is moderate calcification in the lower outer mitral ring. Scattered coronary artery calcifications. The cardiac size is normal. There is a moderate-sized hiatal hernia, increased since 2021. There is a thickened wall in the distal thoracic esophagus. Endoscopic follow-up may be indicated but the findings may simply be due to reflux esophagitis. There are linear scar-like opacities in both lower lobes and a calcified granuloma in the right lower lobe. Lung bases are otherwise clear. Hepatobiliary: There are multiple scattered small hepatic cysts, not significantly changed. No new liver abnormality or mass enhancement. Gallbladder and bile ducts are unremarkable. Pancreas: No abnormality. Spleen: Occasional calcified granulomas.  No other abnormality. Adrenals/Urinary Tract: No adrenal mass. There is symmetric renal excretion. There are Bosniak 1 right renal cysts, in the extreme upper pole measuring  2.1 cm, 10 Hounsfield units, posteriorly in the upper to midpole measuring 6 cm, 11 Hounsfield units. There are additional Bosniak 2 subcentimeter bilateral cysts which are too small to characterize. No follow-up imaging is recommended. There is no urinary stone or obstruction. Stomach/Bowel: The stomach is contracted. Beginning in the jejunum there is small bowel dilatation up to 3.9 cm extending into the pelvis, where there is a feces filled distal ileal segment along the posterior left side wall just above a thick-walled transitional segment with apparent kinking and angulation anterior to the rectum on 3: 71-77. Distal to this the remaining ileum is collapsed, consistent with a high-grade obstruction. No bowel pneumatosis is seen. There is a normal caliber appendix. The colon wall is normal thickness with uncomplicated diverticulosis heaviest in the sigmoid. Vascular/Lymphatic: No significant vascular findings are present. No enlarged abdominal or pelvic lymph nodes. Reproductive: Status post hysterectomy. No adnexal masses. There is mild pelvic floor laxity. Other: Trace ascites in the pelvis. Minimal perihepatic ascites. No free air, free hemorrhage or incarcerated hernia. There is  a wide mouth left inguinal fat hernia. Small umbilical and supraumbilical fat hernias. Musculoskeletal: There is osteopenia, degenerative change and mild levoscoliosis of the lumbar spine., and chronic fractures of the bilateral sacrum and right pubic bone. No acute or other significant osseous findings. Asymmetrically advanced left hip DJD. IMPRESSION: 1. High-grade small bowel obstruction with transition point in the pelvis, where there is a thick-walled transitional segment with apparent kinking and angulation anterior to the rectum. Surgery consult recommended. 2. Trace ascites. No free air or bowel pneumatosis. 3. Moderate-sized hiatal hernia, increased since 2021, with thickened wall in the distal thoracic esophagus.  Endoscopic follow-up may be indicated but the findings may simply be due to reflux esophagitis. 4. Aortic and coronary artery atherosclerosis. 5. Diverticulosis without evidence of diverticulitis. 6. Osteopenia, degenerative change and scoliosis. 7. Chronic fractures of the bilateral sacrum and right pubic bone. 8. Small umbilical, supraumbilical, and left inguinal fat hernias. Aortic Atherosclerosis (ICD10-I70.0). Electronically Signed   By: Francis Quam M.D.   On: 04/15/2024 06:37   Procedures   Medications Ordered in the ED - No data to display  Clinical Course as of 04/15/24 0808  Thu Apr 15, 2024  0659 Endometrial cancer currently undergoing chemo at [facility]. Here for abdominal pain. Has SBO and she is vomiting. Surgery is consulted. Admission. NG tube ordered.  [JR]    Clinical Course User Index [JR] Lang Norleen POUR, PA-C   Medical Decision Making Amount and/or Complexity of Data Reviewed Labs: ordered. Radiology: ordered.  Risk Prescription drug management.   83 y.o. female presents to the ER for evaluation of epigastric pain. Differential diagnosis includes but is not limited to PUD, gastritis, pancreatitis, gastroparesis, malignancy, biliary disease, ACS, pericarditis, pneumonia, intestinal ischemia, esophageal rupture, hepatitis. Vital signs elevated BP at 155/83, temp 97.69F otherwise unremarkable. Physical exam as noted above.   Patient received nausea medication with EMS reports that she is feeling better now.  Has not had any cramping since being in the ER.  She does not appear in any acute distress.  Mildly elevated blood pressure otherwise unremarkable.  Reports that she had something similar around 5 years ago where she was discharged home.  This feels similar in presentation to her.  It appears at that time she had a questionable obstruction however was discharged home.  Will continue with labs and CT imaging.  Patient's not having any chest pain or shortness breath however  given the epigastric pain I have added on troponin and EKG.  I independently reviewed and interpreted the patient's labs.  Lactic acid within normal limits.  Troponin at 5.  Urinalysis still needs to be collected.  CBC does show mild anemia of the 11.1 hemoglobin.  No leukocytosis.  CMP shows glucose of 137, BUN of 27, creatinine 1.24.  Mildly decreased total protein, albumin, and AST.  No other electrolyte or LFT abnormality.  Creatinine around baseline.  CT imaging shows  1. High-grade small bowel obstruction with transition point in the pelvis, where there is a thick-walled transitional segment with apparent kinking and angulation anterior to the rectum. Surgery consult recommended. 2. Trace ascites. No free air or bowel pneumatosis. 3. Moderate-sized hiatal hernia, increased since 2021, with thickened wall in the distal thoracic esophagus. Endoscopic follow-up may be indicated but the findings may simply be due to reflux esophagitis. 4. Aortic and coronary artery atherosclerosis. 5. Diverticulosis without evidence of diverticulitis. 6. Osteopenia, degenerative change and scoliosis. 7. Chronic fractures of the bilateral sacrum and right pubic bone. 8. Small  umbilical, supraumbilical, and left inguinal fat hernias. Aortic Atherosclerosis. Per radiologist's interpretation.    Patient now started to have nausea and pain again.  I have ordered her Zofran  and morphine .  I have placed a small bowel obstruction order protocol given the CT findings.  Consultation made to general surgery.  Will handoff to oncoming shift to follow-up with general surgery, likely likely admission.  Have discussed with patient and daughter at bedside who are agreeable to admission.  Care of [name]  transferred to PA Lang Rush at the end of my shift as the patient will require reassessment once labs/imaging have resulted. Patient presentation, ED course, and plan of care discussed with review of all pertinent labs and  imaging. Please see his/her note for further details regarding further ED course and disposition. Plan at time of handoff is consult general surgery, admit. This may be altered or completely changed at the discretion of the oncoming team pending results of further workup.  Portions of this report may have been transcribed using voice recognition software. Every effort was made to ensure accuracy; however, inadvertent computerized transcription errors may be present.    Final diagnoses:  None    ED Discharge Orders     None          Bernis Ernst, [place] 04/15/24 9183    Haze Lonni PARAS, MD 04/16/24 [phone]

## 2024-04-15 NOTE — ED Notes (Signed)
 Suction paused for gastrografin  administration.

## 2024-04-15 NOTE — ED Provider Notes (Signed)
 Assumed care of this patient from PA [facility] at shift change.  Was found to have a small bowel obstruction during this encounter.  Has endometrial cancer undergoing chemo with [facility]. Physical Exam  BP (!) 155/83   Pulse 66   Temp (!) 97.4 F (36.3 C) (Oral)   Resp 17   Ht 5' 6 (1.676 m)   Wt 70.3 kg   SpO2 100%   BMI 25.02 kg/m   Physical Exam  Procedures  Procedures  ED Course / MDM   Clinical Course as of 04/15/24 0815  Thu Apr 15, 2024  0659 Endometrial cancer currently undergoing chemo at [facility]. Here for abdominal pain. Has SBO and she is vomiting. Surgery is consulted. Admission. NG tube ordered.  [JR]    Clinical Course User Index [JR] Lang Norleen POUR, PA-C   Medical Decision Making Amount and/or Complexity of Data Reviewed Labs: ordered. Radiology: ordered.  Risk Prescription drug management. Decision regarding hospitalization.   NG tube placed.  Discussed patient with surgery who advised medical admission and that they would see her in consult.  Admitted to hospital service.  At this time she is hemodynamically stable, no acute distress and well-appearing.       Lang Norleen POUR, PA-C 04/15/24 9182    Tegeler, Lonni PARAS, MD 04/15/24 [phone]

## 2024-04-15 NOTE — ED Notes (Signed)
 Patient transported to CT

## 2024-04-16 DIAGNOSIS — R109 Unspecified abdominal pain: Secondary | ICD-10-CM

## 2024-04-16 DIAGNOSIS — D72829 Elevated white blood cell count, unspecified: Secondary | ICD-10-CM

## 2024-04-16 DIAGNOSIS — K56609 Unspecified intestinal obstruction, unspecified as to partial versus complete obstruction: Secondary | ICD-10-CM

## 2024-04-16 DIAGNOSIS — D539 Nutritional anemia, unspecified: Secondary | ICD-10-CM

## 2024-04-16 DIAGNOSIS — N1832 Chronic kidney disease, stage 3b: Secondary | ICD-10-CM

## 2024-04-16 LAB — CBC
HCT: 29.4 % — ABNORMAL LOW (ref 36.0–46.0)
Hemoglobin: 9.4 g/dL — ABNORMAL LOW (ref 12.0–15.0)
MCH: 33.2 pg (ref 26.0–34.0)
MCHC: 32 g/dL (ref 30.0–36.0)
MCV: 103.9 fL — ABNORMAL HIGH (ref 80.0–100.0)
Platelets: 203 K/uL (ref 150–400)
RBC: 2.83 MIL/uL — ABNORMAL LOW (ref 3.87–5.11)
RDW: 16.5 % — ABNORMAL HIGH (ref 11.5–15.5)
WBC: 3 K/uL — ABNORMAL LOW (ref 4.0–10.5)
nRBC: 0 % (ref 0.0–0.2)

## 2024-04-16 LAB — COMPREHENSIVE METABOLIC PANEL WITH GFR
ALT: 8 U/L (ref 0–44)
AST: 13 U/L — ABNORMAL LOW (ref 15–41)
Albumin: 2.5 g/dL — ABNORMAL LOW (ref 3.5–5.0)
Alkaline Phosphatase: 58 U/L (ref 38–126)
Anion gap: 9 (ref 5–15)
BUN: 18 mg/dL (ref 8–23)
CO2: 24 mmol/L (ref 22–32)
Calcium: 8.3 mg/dL — ABNORMAL LOW (ref 8.9–10.3)
Chloride: 106 mmol/L (ref 98–111)
Creatinine, Ser: 1.16 mg/dL — ABNORMAL HIGH (ref 0.44–1.00)
GFR, Estimated: 46 mL/min — ABNORMAL LOW (ref >60)
Glucose, Bld: 85 mg/dL (ref 70–99)
Potassium: 3.8 mmol/L (ref 3.5–5.1)
Sodium: 139 mmol/L (ref 135–145)
Total Bilirubin: 0.6 mg/dL (ref 0.0–1.2)
Total Protein: 4.5 g/dL — ABNORMAL LOW (ref 6.5–8.1)

## 2024-04-16 LAB — VITAMIN B12: Vitamin B-12: 2255 pg/mL — ABNORMAL HIGH (ref 180–914)

## 2024-04-16 LAB — FOLATE: Folate: 11.5 ng/mL (ref >5.9)

## 2024-04-16 MED ORDER — ESCITALOPRAM OXALATE 10 MG PO TABS
20.0000 mg | ORAL_TABLET | Freq: Every day | ORAL | Status: DC
  Administered 2024-04-16 – 2024-04-17 (×2): 20 mg via ORAL
  Filled 2024-04-16 (×2): qty 2

## 2024-04-16 MED ORDER — LEVOTHYROXINE SODIUM 112 MCG PO TABS
112.0000 ug | ORAL_TABLET | Freq: Every day | ORAL | Status: DC
  Administered 2024-04-17: 112 ug via ORAL
  Filled 2024-04-16: qty 1

## 2024-04-16 NOTE — Progress Notes (Signed)
 PT Cancellation Note  Patient Details Name: Autumn Frazier MRN: [id number] DOB: 01/05/1941   Cancelled Treatment:    Reason Eval/Treat Not Completed: PT screened, no needs identified, will sign off. Pt reports she is feeling good about her mobility and declines the need to PT evaluation at this time. PT will sign off. Please re-consult if mobility concerns arise.   Bernardino JINNY Ruth 04/16/2024, 4:26 PM

## 2024-04-16 NOTE — Progress Notes (Signed)
 [facility]) - Inpatient Brief Assessment   Patient Details  Name: Autumn Frazier MRN: [id number] Date of Birth: 05-10-1941  [facility] CM/SW Contact:    Rosaline JONELLE Joe, RN Phone Number: 04/16/2024, 11:17 AM   Clinical Narrative: CM met with the patient at the bedside to discuss IP Care management needs.  The patient lives alone at home but daughter, Rosina plans to stay with the patient for a week after she is discharged home - likely tomorrow.  Patient is S/p SBO that NGT was removed last night.  Patient was started on CLD and will likely discharge home tomorrow with family.  DME at the home includes RW (does not use), [facility] - No other IP Care management needs.   Transition of Care Asessment: Insurance and Status: (P) Insurance coverage has been reviewed Patient has primary care physician: (P) Yes Home environment has been reviewed: (P) from home alone Prior level of function:: (P) self - daughter plans to come and stay with her for 1 week post-discharge Prior/Current Home Services: (P) No current home services Social Drivers of Health Review: (P) SDOH reviewed no interventions necessary Readmission risk has been reviewed: (P) Yes Transition of care needs: (P) no transition of care needs at this time

## 2024-04-16 NOTE — Progress Notes (Signed)
 HD#1 SUBJECTIVE:  Patient Summary: Autumn Frazier is a 83 y.o. 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  Overnight Events: None  Interim History: Autumn Frazier feels well this morning and has no new complaints. Denies nausea, vomiting and abdominal pain. Has had 5-6 bowel movements per her report. She slept well and his hungry.  OBJECTIVE:  Vital Signs: Vitals:   04/15/24 2103 04/16/24 0104 04/16/24 0442 04/16/24 0808  BP: 139/79 (!) 113/55 130/69 134/70  Pulse: (!) 57 (!) 53 (!) 57 61  Resp: 17  17 18   Temp: 97.6 F (36.4 C) 97.7 F (36.5 C) 97.6 F (36.4 C) 98.1 F (36.7 C)  TempSrc:      SpO2: 100%  100% 98%  Weight:      Height:       Supplemental O2: Room Air SpO2: 98 %  Filed Weights   04/15/24 0348  Weight: 70.3 kg     Intake/Output Summary (Last 24 hours) at 04/16/2024 1104 Last data filed at 04/15/2024 1845 Gross per 24 hour  Intake 0 ml  Output 100 ml  Net -100 ml   Net IO Since Admission: -100 mL [04/16/24 1104]  Physical Exam: Physical Exam Constitutional:      General: She is not in acute distress.    Appearance: She is not ill-appearing.  HENT:     Head:     Comments: Oropharanx dry appearing. Abdominal:     General: Bowel sounds are decreased. There is no distension.     Tenderness: There is no abdominal tenderness.  Neurological:     Mental Status: She is alert.      Patient Lines/Drains/Airways Status     Active Line/Drains/Airways     Name Placement date Placement time Site Days   Implanted Port Right Chest --  --  Chest  --   Peripheral IV 04/15/24 20 G Anterior;Left Forearm 04/15/24  0347  Forearm  1   Incision (Closed) 11/27/15 Breast Left 11/27/15  1114  -- 3063   Incision (Closed)  11/27/15 Axilla Left 11/27/15  1306  -- 3063            Pertinent labs and imaging:     Latest Ref Rng & Units 04/16/2024    1:49 AM 04/15/2024    4:25 AM 04/15/2024    3:55 AM  CBC  WBC 4.0 - 10.5 K/uL 3.0   5.1   Hemoglobin 12.0 - 15.0 g/dL 9.4  88.7  88.8   Hematocrit 36.0 - 46.0 % 29.4  33.0  34.3   Platelets 150 - 400 K/uL 203   220        Latest Ref Rng & Units 04/16/2024    1:49 AM 04/15/2024    4:25 AM 04/15/2024    3:55 AM  CMP  Glucose 70 - 99 mg/dL 85  867  862   BUN 8 - 23 mg/dL 18  27  27    Creatinine 0.44 - 1.00 mg/dL 8.83  8.59  8.75   Sodium 135 - 145 mmol/L 139  138  139   Potassium 3.5 - 5.1 mmol/L 3.8  3.7  3.8   Chloride 98 - 111 mmol/L 106  105  105   CO2 22 - 32 mmol/L 24   23   Calcium 8.9 -  10.3 mg/dL 8.3   9.4   Total Protein 6.5 - 8.1 g/dL 4.5   5.6   Total Bilirubin 0.0 - 1.2 mg/dL 0.6   0.6   Alkaline Phos 38 - 126 U/L 58   65   AST 15 - 41 U/L 13   14   ALT 0 - 44 U/L 8   10     DG Abd Portable 1V-Small Bowel Obstruction Protocol-initial, 8 hr delay Result Date: 04/15/2024 CLINICAL DATA:  Reason for exam: small bowel obstruction, 8 hr delay EXAM: PORTABLE ABDOMEN - 1 VIEW COMPARISON:  X-ray abdomen 04/15/2024 FINDINGS: Upper abdomen collimated off view. PO contrast reaches the rectum. The bowel gas pattern is normal. No radio-opaque calculi or other significant radiographic abnormality are seen. IMPRESSION: PO contrast reaches the rectum. Electronically Signed   By: Morgane  Naveau M.D.   On: 04/15/2024 21:59    ASSESSMENT/PLAN:  Assessment: Principal Problem:   SBO (small bowel obstruction) (HCC) Active Problems:   Malignant neoplasm of lower-outer quadrant of left breast of female, estrogen receptor positive (HCC)   CKD stage 3b, GFR 30-44 ml/min (HCC)  Autumn Frazier is a 83 y.o. 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  Plan:  # Small bowel obstruction # Abdominal pain Presented with epigastric pain, persistent N/V.  She was afebrile, no leukocytosis, normal lipase.  CT abdomen and pelvis showed high-grade small bowel obstruction. She has risk factors for SBO including prior abdominal surgery, and metastatic uterine cancer. General surgery consulted, recommending supportive treatment for now.  She had an NG tube placed, follow-up x-ray showed the absence of dilated gas-filled abdominal loops, as previously identified on CT. Second x-ray showed that the PO contrast had reached the rectum. Lactic acid 1.7 and 1.8. Now without nausea and vomiting, multiple bowel movements, and no abdominal pain.   Plan: - General Surgery following appreciate recs. - Advance diet as tolerated - Strict I's and O's - Zofran  4mg  IV q6 PRN - Monitor electrolytes - Restart PO medications   # CKD stage IIIb Creatinine mildly increased about her baseline of 1.0-1.2 on admission, likely prerenal from decreased p.o. intake.  Creatinine improved to 1.16 this morning after administration of fluids.  Plan:  - Avoid nephrotoxic drugs - BMP daily   # Metastatic Uterine serous carcinoma # Leukopenia Surgically treated in 2018, with robotic hysterectomy, and sentinel lymph node biopsy.  She was on chemo therapy between 2018-2021.  PET scan in June 2024 showed recurrent disease, thus was started on chemotherapy.  She has completed 4 cycles of trastuzumab  deruxtecan, and a follow-up PET in June 2025 shows decreased activity in the retroperitoneal abdominal lymph nodes.  She follows with [facility] for her cancer care, follows with Dr. Arlee MD.  WBC at 3.0 likely low in the setting of chemotherapy. -Follow-up with [facility] for cancer care.   # Macrocytic anemia Hgb 11.1, MCV 102 on admission.  Her baseline Hgb  has been between 11-12. B12 and Folate normal. Hgb 9.4 this morning decreased from 11.2, possibly dilutional given drop in other cell lines concurrent to Hgb drop. No signs of bleeding. Macrocytosis in the setting of chemotherapy. -Monitor for bleeding and check CBC daily.  #Hypothyroidism -Restart Levothyroxine 112mcg  #Depression -Home med Lexapro 20mg , consider reduce dose at follow-up given  her age.   Best Practice: Diet: Clear liquid diet, advance as tolerated VTE: heparin  injection 5,000 Units Start: 04/15/24 1400 Code: Full  Disposition planning: Therapy Recs: Pending,  DISPO: Anticipated discharge tomorrow to Home pending tolerating PO diet.  Signature:  Melvenia Napoleon [facility]  11:04 AM, 04/16/2024  On Call pager [phone]

## 2024-04-16 NOTE — Plan of Care (Signed)

## 2024-04-16 NOTE — Discharge Summary (Signed)
 Name: Autumn Frazier MRN: [id number] DOB: 1941-05-16 83 y.o. PCP: Cleotilde Pinkie BRAVO, PA  Date of Admission: 04/15/2024  3:39 AM Date of Discharge: 04/17/2024  Attending Physician: Dr. Ronnald Sergeant  Discharge Diagnosis: 1. Principal Problem:   SBO (small bowel obstruction) (HCC) Active Problems:   Malignant neoplasm of lower-outer quadrant of left breast of female, estrogen receptor positive (HCC)   CKD stage 3b, GFR 30-44 ml/min [facility])   Discharge Medications: Allergies as of 04/17/2024   No Known Allergies      Medication List     TAKE these medications    cholecalciferol 1000 units tablet Commonly known as: VITAMIN D Take 1,000 Units by mouth daily.   [employer] MAGNESIUM PO Take 1 capsule by mouth daily at 12 noon.   escitalopram  20 MG tablet Commonly known as: LEXAPRO  Take 20 mg by mouth daily.   IRON PO Take 1 capsule by mouth daily at 12 noon.   levothyroxine  112 MCG tablet Commonly known as: SYNTHROID  Take 112 mcg by mouth daily before breakfast.        Disposition and follow-up:   [name] was discharged from [facility] in good condition.  At the hospital follow up visit please address:  # Small bowel obstruction # Abdominal pain Presented with epigastric pain, persistent nausea and vomiting.  CT abdomen and pelvis showed high-grade small bowel obstruction. She has risk factors for SBO including prior abdominal surgery, and metastatic uterine cancer. General surgery consulted, and recommended conservative measures with NG tube and NPO as she was not peritonitic. Improved without surgical intervention. She had multiple bowel movements and was asymptomatic at the time of discharge. Please assess abdominal symptoms.   # CKD stage IIIb Creatinine mildly increased from her baseline of 1.0-1.2 to 1.40 on admission, likely prerenal from decreased p.o. intake.  Creatinine improved to 1.16 after fluid administration and was 1.10 on the morning of  discharge. Please check BMP.  # Metastatic Uterine serous carcinoma # Leukopenia # Macrocytic anemia Surgically treated in 2018, with robotic hysterectomy, and sentinel lymph node biopsy.  She was on chemo therapy between 2018-2021.  PET scan in June 2024 showed recurrent disease, thus was started on chemotherapy.  She has completed 4 cycles of trastuzumab  deruxtecan, and a follow-up PET in June 2025 shows decreased activity in the retroperitoneal abdominal lymph nodes.  She follows with [facility] for her cancer care, follows with Dr. Arlee MD.  WBC at 3.0 likely low in the setting of chemotherapy.   Hgb 11.1, MCV 102 on admission.  Her baseline Hgb has been between 11-12. B12 and Folate normal. Hgb 9.4 on 7/25 decreased from 11.2, possibly dilutional given drop in other cell lines concurrent to Hgb drop. No signs of bleeding. Macrocytic anemia likely due to her chemotherapy. Please check CBC   #Depression PO Lexapro  20mg  held during NPO and restarted before discharge, consider reducing dose at follow-up given her age.   Labs / imaging needed at time of follow-up: BMP, CBC  Pending labs/ test needing follow-up: None  Follow-up Appointments:  Follow-up Information     Miller, [place]  E, PA Follow up in 1 week(s).   Specialty: Internal Medicine Why: Call to make appointment with your PCP in 1-2 weeks Contact information: [address] [place] [address] [phone]                  Hospital Course by problem list: Autumn Frazier is a 83  y.o. female with a past medical history of breast cancer s/p chemotherapy, stage IIIc uterine serous carcinoma of the uterus (treated surgically in 2018) who presented with abdominal pain, and persistent nausea/vomiting, and admitted for small bowel obstruction, which improved with conservative measures, now being discharged with the following pertinent hospital course:  # Small bowel obstruction # Abdominal  pain Presented with epigastric pain, persistent nausea and vomiting.  She was afebrile, no leukocytosis, normal lipase, normal lactic acid.  CT abdomen and pelvis showed high-grade small bowel obstruction. She has risk factors for SBO including prior abdominal surgery, and metastatic uterine cancer. General surgery consulted, and recommended conservative measures with NG tube and NPO as she was not peritonitic. She had an NG tube placed, follow-up x-ray showed the absence of dilated gas-filled abdominal loops, as previously identified on CT. Second x-ray 8 hours later showed that the PO contrast had reached the rectum. Her diet was advanced on hospital day 2 with no nausea and vomiting or abdominal pain. She had multiple bowel movements.   # CKD stage IIIb Creatinine mildly increased from her baseline of 1.0-1.2 to 1.40 on admission, likely prerenal from decreased p.o. intake.  Creatinine improved to 1.16 after fluid administration and was 1.10 on the morning of discharge    # Metastatic Uterine serous carcinoma # Leukopenia Surgically treated in 2018, with robotic hysterectomy, and sentinel lymph node biopsy.  She was on chemo therapy between 2018-2021.  PET scan in June 2024 showed recurrent disease, thus was started on chemotherapy.  She has completed 4 cycles of trastuzumab  deruxtecan, and a follow-up PET in June 2025 shows decreased activity in the retroperitoneal abdominal lymph nodes.  She follows with [facility] for her cancer care, follows with Dr. Arlee MD.  Leukopenia (3.3 on day of discharge) likely low in the setting of chemotherapy.   # Macrocytic anemia Hgb 11.1, MCV 102 on admission.  Her baseline Hgb has been between 11-12. B12 and Folate normal. Hgb 9.4 ton 7/25 decreased from 11.2, possibly dilutional given drop in other cell lines concurrent to Hgb drop. No signs of bleeding. Macrocytic anemia likely due to her chemotherapy. Hgb 10.2 on day of discharge   #Hypothyroidism PO  Levothyroxine  held during NPO and restarted before discharge.   #Depression PO Lexapro  held during NPO and restarted before discharge, consider reducing dose at follow-up given her age.    Discharge Subjective AMBERLI RUEGG is feeling well today. She has no new symptoms. She has had no nausea and vomiting after the advancing of her diet. Yesterday, she ate some of a baked potato and burger after eating jello and pudding for lunch. She has had several bowel movements since her obstruction. She would like to go home today.  Discharge Exam:   BP 120/61 (BP Location: Right Arm)   Pulse 62   Temp 98 F (36.7 C) (Oral)   Resp 17   Ht 5' 6 (1.676 m)   Wt 70.3 kg   SpO2 93%   BMI 25.02 kg/m  Discharge exam:  Physical Exam Constitutional:      General: She is not in acute distress.    Appearance: She is not ill-appearing.  Cardiovascular:     Rate and Rhythm: Normal rate and regular rhythm.     Heart sounds: No murmur heard. Pulmonary:     Effort: No respiratory distress.  Abdominal:     General: Bowel sounds are decreased. There is no distension.     Tenderness: There is no  abdominal tenderness.  Neurological:     Mental Status: She is alert.      Pertinent Labs, Studies, and Procedures:     Latest Ref Rng & Units 04/17/2024    2:11 AM 04/16/2024    1:49 AM 04/15/2024    4:25 AM  CBC  WBC 4.0 - 10.5 K/uL 3.3  3.0    Hemoglobin 12.0 - 15.0 g/dL 89.7  9.4  88.7   Hematocrit 36.0 - 46.0 % 32.4  29.4  33.0   Platelets 150 - 400 K/uL 211  203         Latest Ref Rng & Units 04/17/2024    2:11 AM 04/16/2024    1:49 AM 04/15/2024    4:25 AM  CMP  Glucose 70 - 99 mg/dL 89  85  867   BUN 8 - 23 mg/dL 17  18  27    Creatinine 0.44 - 1.00 mg/dL 8.89  8.83  8.59   Sodium 135 - 145 mmol/L 141  139  138   Potassium 3.5 - 5.1 mmol/L 4.1  3.8  3.7   Chloride 98 - 111 mmol/L 110  106  105   CO2 22 - 32 mmol/L 26  24    Calcium 8.9 - 10.3 mg/dL 8.4  8.3    Total Protein 6.5 - 8.1 g/dL   4.5    Total Bilirubin 0.0 - 1.2 mg/dL  0.6    Alkaline Phos 38 - 126 U/L  58    AST 15 - 41 U/L  13    ALT 0 - 44 U/L  8      DG Abd Portable 1V-Small Bowel Obstruction Protocol-initial, 8 hr delay Result Date: 04/15/2024 CLINICAL DATA:  Reason for exam: small bowel obstruction, 8 hr delay EXAM: PORTABLE ABDOMEN - 1 VIEW COMPARISON:  X-ray abdomen 04/15/2024 FINDINGS: Upper abdomen collimated off view. PO contrast reaches the rectum. The bowel gas pattern is normal. No radio-opaque calculi or other significant radiographic abnormality are seen. IMPRESSION: PO contrast reaches the rectum. Electronically Signed   By: Morgane  Naveau M.D.   On: 04/15/2024 21:59   DG Abd Portable 1V-Small Bowel Protocol-Position Verification Result Date: 04/15/2024 CLINICAL DATA:  Nasogastric tube placement EXAM: PORTABLE ABDOMEN - 1 VIEW COMPARISON:  CT scan 04/15/2024 FINDINGS: None moderate hiatal hernia. Nasogastric tube side port is in the stomach body with tip in the vicinity of the stomach fundus. Contrast medium left over from the patient's CT scan is present in both renal collecting systems. The pelvis was excluded from imaging. No definite dilated gas-filled upper abdominal loops of small bowel are identified, although mildly dilated small bowel loops were present on the CT scan obtained at 5:13 a.m. IMPRESSION: 1. Nasogastric tube side port is in the stomach body with tip in the vicinity of the stomach fundus. 2. Moderate hiatal hernia. Electronically Signed   By: Ryan Salvage M.D.   On: 04/15/2024 09:35   CT ABDOMEN PELVIS W CONTRAST Result Date: 04/15/2024 CLINICAL DATA:  Nonlocalized epigastric pain and cramping. Undergoing chemotherapy for recurring uterine cancer. EXAM: CT ABDOMEN AND PELVIS WITH CONTRAST TECHNIQUE: Multidetector CT imaging of the abdomen and pelvis was performed using the standard protocol following bolus administration of intravenous contrast. RADIATION DOSE REDUCTION: This exam was  performed according to the departmental dose-optimization program which includes automated exposure control, adjustment of the mA and/or kV according to patient size and/or use of iterative reconstruction technique. CONTRAST:  75mL OMNIPAQUE  IOHEXOL  350 MG/ML SOLN COMPARISON:  CT with IV contrast 10/08/2019, CT without contrast 04/26/2008. FINDINGS: Lower chest: There is moderate calcification in the lower outer mitral ring. Scattered coronary artery calcifications. The cardiac size is normal. There is a moderate-sized hiatal hernia, increased since 2021. There is a thickened wall in the distal thoracic esophagus. Endoscopic follow-up may be indicated but the findings may simply be due to reflux esophagitis. There are linear scar-like opacities in both lower lobes and a calcified granuloma in the right lower lobe. Lung bases are otherwise clear. Hepatobiliary: There are multiple scattered small hepatic cysts, not significantly changed. No new liver abnormality or mass enhancement. Gallbladder and bile ducts are unremarkable. Pancreas: No abnormality. Spleen: Occasional calcified granulomas.  No other abnormality. Adrenals/Urinary Tract: No adrenal mass. There is symmetric renal excretion. There are Bosniak 1 right renal cysts, in the extreme upper pole measuring 2.1 cm, 10 Hounsfield units, posteriorly in the upper to midpole measuring 6 cm, 11 Hounsfield units. There are additional Bosniak 2 subcentimeter bilateral cysts which are too small to characterize. No follow-up imaging is recommended. There is no urinary stone or obstruction. Stomach/Bowel: The stomach is contracted. Beginning in the jejunum there is small bowel dilatation up to 3.9 cm extending into the pelvis, where there is a feces filled distal ileal segment along the posterior left side wall just above a thick-walled transitional segment with apparent kinking and angulation anterior to the rectum on 3: 71-77. Distal to this the remaining ileum is  collapsed, consistent with a high-grade obstruction. No bowel pneumatosis is seen. There is a normal caliber appendix. The colon wall is normal thickness with uncomplicated diverticulosis heaviest in the sigmoid. Vascular/Lymphatic: No significant vascular findings are present. No enlarged abdominal or pelvic lymph nodes. Reproductive: Status post hysterectomy. No adnexal masses. There is mild pelvic floor laxity. Other: Trace ascites in the pelvis. Minimal perihepatic ascites. No free air, free hemorrhage or incarcerated hernia. There is a wide mouth left inguinal fat hernia. Small umbilical and supraumbilical fat hernias. Musculoskeletal: There is osteopenia, degenerative change and mild levoscoliosis of the lumbar spine., and chronic fractures of the bilateral sacrum and right pubic bone. No acute or other significant osseous findings. Asymmetrically advanced left hip DJD. IMPRESSION: 1. High-grade small bowel obstruction with transition point in the pelvis, where there is a thick-walled transitional segment with apparent kinking and angulation anterior to the rectum. Surgery consult recommended. 2. Trace ascites. No free air or bowel pneumatosis. 3. Moderate-sized hiatal hernia, increased since 2021, with thickened wall in the distal thoracic esophagus. Endoscopic follow-up may be indicated but the findings may simply be due to reflux esophagitis. 4. Aortic and coronary artery atherosclerosis. 5. Diverticulosis without evidence of diverticulitis. 6. Osteopenia, degenerative change and scoliosis. 7. Chronic fractures of the bilateral sacrum and right pubic bone. 8. Small umbilical, supraumbilical, and left inguinal fat hernias. Aortic Atherosclerosis (ICD10-I70.0). Electronically Signed   By: Francis Quam M.D.   On: 04/15/2024 06:37     Discharge Instructions:  You were hospitalized for a small bowel obstruction (blockage). We treated you with suction through your nose, IV fluids and keeping you from  eating for a day. Thank you for allowing us  to be part of your care.   Please call to schedule an appointment with your primary care doctor in the next week.  We did not make any changes to your home medications.  If you have symptoms like you did before this admission, such as uncontrollable nausea and vomiting with abdominal pain, please call your primary  care doctor for further advice. You may have to come back to the hospital.  Signed: Napoleon Limes, MD 04/17/2024, 7:53 AM

## 2024-04-16 NOTE — Progress Notes (Signed)
 Subjective: CC: Family at bedside. Patient reports abdominal pain has resolved. NGT came out overnight. No n/v. Several bowel movements overnight. Xray w/ contrast in the colon that reaches the rectum.   Afebrile. Tachycardia resolved. No systolic hypotension. WBC non-elevated.   Objective: Vital signs in last 24 hours: Temp:  [97.6 F (36.4 C)-99.3 F (37.4 C)] 98.1 F (36.7 C) (07/25 0808) Pulse Rate:  [53-95] 61 (07/25 0808) Resp:  [16-24] 18 (07/25 0808) BP: (113-157)/(55-133) 134/70 (07/25 0808) SpO2:  [96 %-100 %] 98 % (07/25 0808) Last BM Date : 04/15/24  Intake/Output from previous day: 07/24 0701 - 07/25 0700 In: 0  Out: 100  Intake/Output this shift: No intake/output data recorded.  PE: Gen:  Alert, NAD, pleasant Abd: Soft, ND, NT  Lab Results:  Recent Labs    04/15/24 0355 04/15/24 0425 04/16/24 0149  WBC 5.1  --  3.0*  HGB 11.1* 11.2* 9.4*  HCT 34.3* 33.0* 29.4*  PLT 220  --  203   BMET Recent Labs    04/15/24 0355 04/15/24 0425 04/16/24 0149  NA 139 138 139  K 3.8 3.7 3.8  CL 105 105 106  CO2 23  --  24  GLUCOSE 137* 132* 85  BUN 27* 27* 18  CREATININE 1.24* 1.40* 1.16*  CALCIUM 9.4  --  8.3*   PT/INR No results for input(s): LABPROT, INR in the last 72 hours. CMP     Component Value Date/Time   NA 139 04/16/2024 0149   NA 139 09/09/2017 1020   K 3.8 04/16/2024 0149   K 4.3 09/09/2017 1020   CL 106 04/16/2024 0149   CO2 24 04/16/2024 0149   CO2 26 09/09/2017 1020   GLUCOSE 85 04/16/2024 0149   GLUCOSE 93 09/09/2017 1020   BUN 18 04/16/2024 0149   BUN 11.0 09/09/2017 1020   CREATININE 1.16 (H) 04/16/2024 0149   CREATININE 1.04 (H) 05/14/2021 0930   CREATININE 0.9 09/09/2017 1020   CALCIUM 8.3 (L) 04/16/2024 0149   CALCIUM 9.0 09/09/2017 1020   PROT 4.5 (L) 04/16/2024 0149   PROT 6.6 06/30/2017 1048   ALBUMIN 2.5 (L) 04/16/2024 0149   ALBUMIN 3.7 06/30/2017 1048   AST 13 (L) 04/16/2024 0149   AST 15 05/14/2021  0930   AST 19 06/30/2017 1048   ALT 8 04/16/2024 0149   ALT 14 05/14/2021 0930   ALT 17 06/30/2017 1048   ALKPHOS 58 04/16/2024 0149   ALKPHOS 73 06/30/2017 1048   BILITOT 0.6 04/16/2024 0149   BILITOT 0.3 05/14/2021 0930   BILITOT 0.44 06/30/2017 1048   GFRNONAA 46 (L) 04/16/2024 0149   GFRNONAA 54 (L) 05/14/2021 0930   GFRAA 51 (L) 04/17/2020 0948   Lipase     Component Value Date/Time   LIPASE 25 04/15/2024 0355    Studies/Results: DG Abd Portable 1V-Small Bowel Obstruction Protocol-initial, 8 hr delay Result Date: 04/15/2024 CLINICAL DATA:  Reason for exam: small bowel obstruction, 8 hr delay EXAM: PORTABLE ABDOMEN - 1 VIEW COMPARISON:  X-ray abdomen 04/15/2024 FINDINGS: Upper abdomen collimated off view. PO contrast reaches the rectum. The bowel gas pattern is normal. No radio-opaque calculi or other significant radiographic abnormality are seen. IMPRESSION: PO contrast reaches the rectum. Electronically Signed   By: Morgane  Naveau M.D.   On: 04/15/2024 21:59   DG Abd Portable 1V-Small Bowel Protocol-Position Verification Result Date: 04/15/2024 CLINICAL DATA:  Nasogastric tube placement EXAM: PORTABLE ABDOMEN - 1 VIEW COMPARISON:  CT scan  04/15/2024 FINDINGS: None moderate hiatal hernia. Nasogastric tube side port is in the stomach body with tip in the vicinity of the stomach fundus. Contrast medium left over from the patient's CT scan is present in both renal collecting systems. The pelvis was excluded from imaging. No definite dilated gas-filled upper abdominal loops of small bowel are identified, although mildly dilated small bowel loops were present on the CT scan obtained at 5:13 a.m. IMPRESSION: 1. Nasogastric tube side port is in the stomach body with tip in the vicinity of the stomach fundus. 2. Moderate hiatal hernia. Electronically Signed   By: Ryan Salvage M.D.   On: 04/15/2024 09:35   CT ABDOMEN PELVIS W CONTRAST Result Date: 04/15/2024 CLINICAL DATA:  Nonlocalized  epigastric pain and cramping. Undergoing chemotherapy for recurring uterine cancer. EXAM: CT ABDOMEN AND PELVIS WITH CONTRAST TECHNIQUE: Multidetector CT imaging of the abdomen and pelvis was performed using the standard protocol following bolus administration of intravenous contrast. RADIATION DOSE REDUCTION: This exam was performed according to the departmental dose-optimization program which includes automated exposure control, adjustment of the mA and/or kV according to patient size and/or use of iterative reconstruction technique. CONTRAST:  75mL OMNIPAQUE  IOHEXOL  350 MG/ML SOLN COMPARISON:  CT with IV contrast 10/08/2019, CT without contrast 04/26/2008. FINDINGS: Lower chest: There is moderate calcification in the lower outer mitral ring. Scattered coronary artery calcifications. The cardiac size is normal. There is a moderate-sized hiatal hernia, increased since 2021. There is a thickened wall in the distal thoracic esophagus. Endoscopic follow-up may be indicated but the findings may simply be due to reflux esophagitis. There are linear scar-like opacities in both lower lobes and a calcified granuloma in the right lower lobe. Lung bases are otherwise clear. Hepatobiliary: There are multiple scattered small hepatic cysts, not significantly changed. No new liver abnormality or mass enhancement. Gallbladder and bile ducts are unremarkable. Pancreas: No abnormality. Spleen: Occasional calcified granulomas.  No other abnormality. Adrenals/Urinary Tract: No adrenal mass. There is symmetric renal excretion. There are Bosniak 1 right renal cysts, in the extreme upper pole measuring 2.1 cm, 10 Hounsfield units, posteriorly in the upper to midpole measuring 6 cm, 11 Hounsfield units. There are additional Bosniak 2 subcentimeter bilateral cysts which are too small to characterize. No follow-up imaging is recommended. There is no urinary stone or obstruction. Stomach/Bowel: The stomach is contracted. Beginning in the  jejunum there is small bowel dilatation up to 3.9 cm extending into the pelvis, where there is a feces filled distal ileal segment along the posterior left side wall just above a thick-walled transitional segment with apparent kinking and angulation anterior to the rectum on 3: 71-77. Distal to this the remaining ileum is collapsed, consistent with a high-grade obstruction. No bowel pneumatosis is seen. There is a normal caliber appendix. The colon wall is normal thickness with uncomplicated diverticulosis heaviest in the sigmoid. Vascular/Lymphatic: No significant vascular findings are present. No enlarged abdominal or pelvic lymph nodes. Reproductive: Status post hysterectomy. No adnexal masses. There is mild pelvic floor laxity. Other: Trace ascites in the pelvis. Minimal perihepatic ascites. No free air, free hemorrhage or incarcerated hernia. There is a wide mouth left inguinal fat hernia. Small umbilical and supraumbilical fat hernias. Musculoskeletal: There is osteopenia, degenerative change and mild levoscoliosis of the lumbar spine., and chronic fractures of the bilateral sacrum and right pubic bone. No acute or other significant osseous findings. Asymmetrically advanced left hip DJD. IMPRESSION: 1. High-grade small bowel obstruction with transition point in the pelvis, where there  is a thick-walled transitional segment with apparent kinking and angulation anterior to the rectum. Surgery consult recommended. 2. Trace ascites. No free air or bowel pneumatosis. 3. Moderate-sized hiatal hernia, increased since 2021, with thickened wall in the distal thoracic esophagus. Endoscopic follow-up may be indicated but the findings may simply be due to reflux esophagitis. 4. Aortic and coronary artery atherosclerosis. 5. Diverticulosis without evidence of diverticulitis. 6. Osteopenia, degenerative change and scoliosis. 7. Chronic fractures of the bilateral sacrum and right pubic bone. 8. Small umbilical,  supraumbilical, and left inguinal fat hernias. Aortic Atherosclerosis (ICD10-I70.0). Electronically Signed   By: Francis Quam M.D.   On: 04/15/2024 06:37    Anti-infectives: Anti-infectives (From admission, onward)    None        Assessment/Plan SBO - Hx of total abdominal hysterectomy and radiation treatment secondary to endometrial cancer  - No indication for emergency surgery.  - Clinically and radiographically appears to be resolving. She reports resolution of abdominal pain, n/v and has return of bowel function; ND/NT on exam; xray with contrast in colon extending to the rectum. Will advance diet as tolerated. If tolerates diet advancement, anticipate discharge as early as tomorrow.   FEN - FLD, ADAT to Soft, IVF per primary  VTE - SCDs, sqh ID - None  I reviewed nursing notes, hospitalist notes, last 24 h vitals and pain scores, last 48 h intake and output, last 24 h labs and trends, and last 24 h imaging results.    LOS: 1 day    Ozell CHRISTELLA Shaper, [facility] 04/16/2024, 9:40 AM Please see Amion for pager number during day hours 7:00am-4:30pm

## 2024-04-16 NOTE — Discharge Instructions (Addendum)
 You were hospitalized for a small bowel obstruction (blockage). We treated you with suction through your nose, IV fluids and keeping you from eating for a day. Thank you for allowing us  to be part of your care.   Please call to schedule an appointment with your primary care doctor in the next week.  We did not make any changes to your home medications.  If you have symptoms like you did before this admission, such as uncontrollable nausea and vomiting with abdominal pain, please call your primary care doctor for further advice. You may have to come back to the hospital.

## 2024-04-17 LAB — CBC
HCT: 32.4 % — ABNORMAL LOW (ref 36.0–46.0)
Hemoglobin: 10.2 g/dL — ABNORMAL LOW (ref 12.0–15.0)
MCH: 32.9 pg (ref 26.0–34.0)
MCHC: 31.5 g/dL (ref 30.0–36.0)
MCV: 104.5 fL — ABNORMAL HIGH (ref 80.0–100.0)
Platelets: 211 K/uL (ref 150–400)
RBC: 3.1 MIL/uL — ABNORMAL LOW (ref 3.87–5.11)
RDW: 16.3 % — ABNORMAL HIGH (ref 11.5–15.5)
WBC: 3.3 K/uL — ABNORMAL LOW (ref 4.0–10.5)
nRBC: 0 % (ref 0.0–0.2)

## 2024-04-17 LAB — BASIC METABOLIC PANEL WITH GFR
Anion gap: 5 (ref 5–15)
BUN: 17 mg/dL (ref 8–23)
CO2: 26 mmol/L (ref 22–32)
Calcium: 8.4 mg/dL — ABNORMAL LOW (ref 8.9–10.3)
Chloride: 110 mmol/L (ref 98–111)
Creatinine, Ser: 1.1 mg/dL — ABNORMAL HIGH (ref 0.44–1.00)
GFR, Estimated: 50 mL/min — ABNORMAL LOW (ref >60)
Glucose, Bld: 89 mg/dL (ref 70–99)
Potassium: 4.1 mmol/L (ref 3.5–5.1)
Sodium: 141 mmol/L (ref 135–145)

## 2024-04-17 LAB — MAGNESIUM: Magnesium: 1.9 mg/dL (ref 1.7–2.4)

## 2024-04-17 NOTE — Progress Notes (Signed)
 Assessment & Plan: SBO - Hx of total abdominal hysterectomy and radiation treatment secondary to endometrial cancer  - tolerating FLD, no abd pain - advance to soft diet - patient anticipating discharge home today   FEN - soft, ADAT to Soft, IVF per primary  VTE - SCDs, sqh ID - None  Surgery will sign off.  SBO appears resolved.  Call if needed.        Krystal Spinner, MD [facility] practice Office: [phone]        Chief Complaint: SBO  Subjective: Patient in bed, comfortable, tolerating FLD.  Objective: Vital signs in last 24 hours: Temp:  [97.6 F (36.4 C)-98.4 F (36.9 C)] 97.6 F (36.4 C) (07/26 0813) Pulse Rate:  [57-72] 67 (07/26 0813) Resp:  [16-18] 16 (07/26 0813) BP: (120-147)/(55-73) 147/73 (07/26 0813) SpO2:  [93 %-99 %] 95 % (07/26 0813) Last BM Date : 04/15/24  Intake/Output from previous day: 07/25 0701 - 07/26 0700 In: 300 [P.O.:300] Out: 0  Intake/Output this shift: No intake/output data recorded.  Physical Exam: HEENT - sclerae clear, mucous membranes moist Abdomen - soft without distension; non-tender Ext - no edema, non-tender  Lab Results:  Recent Labs    04/16/24 0149 04/17/24 0211  WBC 3.0* 3.3*  HGB 9.4* 10.2*  HCT 29.4* 32.4*  PLT 203 211   BMET Recent Labs    04/16/24 0149 04/17/24 0211  NA 139 141  K 3.8 4.1  CL 106 110  CO2 24 26  GLUCOSE 85 89  BUN 18 17  CREATININE 1.16* 1.10*  CALCIUM 8.3* 8.4*   PT/INR No results for input(s): LABPROT, INR in the last 72 hours. Comprehensive Metabolic Panel:    Component Value Date/Time   NA 141 04/17/2024 0211   NA 139 04/16/2024 0149   NA 139 09/09/2017 1020   NA 140 06/30/2017 1048   K 4.1 04/17/2024 0211   K 3.8 04/16/2024 0149   K 4.3 09/09/2017 1020   K 4.1 06/30/2017 1048   CL 110 04/17/2024 0211   CL 106 04/16/2024 0149   CO2 26 04/17/2024 0211   CO2 24 04/16/2024 0149   CO2 26 09/09/2017 1020   CO2 25 06/30/2017 1048    BUN 17 04/17/2024 0211   BUN 18 04/16/2024 0149   BUN 11.0 09/09/2017 1020   BUN 13.2 06/30/2017 1048   CREATININE 1.10 (H) 04/17/2024 0211   CREATININE 1.16 (H) 04/16/2024 0149   CREATININE 1.04 (H) 05/14/2021 0930   CREATININE 1.16 (H) 04/17/2020 0948   CREATININE 0.9 09/09/2017 1020   CREATININE 0.9 06/30/2017 1048   GLUCOSE 89 04/17/2024 0211   GLUCOSE 85 04/16/2024 0149   GLUCOSE 93 09/09/2017 1020   GLUCOSE 104 06/30/2017 1048   CALCIUM 8.4 (L) 04/17/2024 0211   CALCIUM 8.3 (L) 04/16/2024 0149   CALCIUM 9.0 09/09/2017 1020   CALCIUM 9.5 06/30/2017 1048   AST 13 (L) 04/16/2024 0149   AST 14 (L) 04/15/2024 0355   AST 15 05/14/2021 0930   AST 13 (L) 04/17/2020 0948   AST 19 06/30/2017 1048   AST 14 11/08/2015 1224   ALT 8 04/16/2024 0149   ALT 10 04/15/2024 0355   ALT 14 05/14/2021 0930   ALT 12 04/17/2020 0948   ALT 17 06/30/2017 1048   ALT 17 11/08/2015 1224   ALKPHOS 58 04/16/2024 0149   ALKPHOS 65 04/15/2024 0355   ALKPHOS 73 06/30/2017 1048   ALKPHOS 96 11/08/2015 1224  BILITOT 0.6 04/16/2024 0149   BILITOT 0.6 04/15/2024 0355   BILITOT 0.3 05/14/2021 0930   BILITOT 0.4 04/17/2020 0948   BILITOT 0.44 06/30/2017 1048   BILITOT 0.33 11/08/2015 1224   PROT 4.5 (L) 04/16/2024 0149   PROT 5.6 (L) 04/15/2024 0355   PROT 6.6 06/30/2017 1048   PROT 6.5 11/08/2015 1224   ALBUMIN 2.5 (L) 04/16/2024 0149   ALBUMIN 3.2 (L) 04/15/2024 0355   ALBUMIN 3.7 06/30/2017 1048   ALBUMIN 3.5 11/08/2015 1224    Studies/Results: DG Abd Portable 1V-Small Bowel Obstruction Protocol-initial, 8 hr delay Result Date: 04/15/2024 CLINICAL DATA:  Reason for exam: small bowel obstruction, 8 hr delay EXAM: PORTABLE ABDOMEN - 1 VIEW COMPARISON:  X-ray abdomen 04/15/2024 FINDINGS: Upper abdomen collimated off view. PO contrast reaches the rectum. The bowel gas pattern is normal. No radio-opaque calculi or other significant radiographic abnormality are seen. IMPRESSION: PO contrast reaches  the rectum. Electronically Signed   By: Morgane  Naveau M.D.   On: 04/15/2024 21:59   DG Abd Portable 1V-Small Bowel Protocol-Position Verification Result Date: 04/15/2024 CLINICAL DATA:  Nasogastric tube placement EXAM: PORTABLE ABDOMEN - 1 VIEW COMPARISON:  CT scan 04/15/2024 FINDINGS: None moderate hiatal hernia. Nasogastric tube side port is in the stomach body with tip in the vicinity of the stomach fundus. Contrast medium left over from the patient's CT scan is present in both renal collecting systems. The pelvis was excluded from imaging. No definite dilated gas-filled upper abdominal loops of small bowel are identified, although mildly dilated small bowel loops were present on the CT scan obtained at 5:13 a.m. IMPRESSION: 1. Nasogastric tube side port is in the stomach body with tip in the vicinity of the stomach fundus. 2. Moderate hiatal hernia. Electronically Signed   By: Ryan Salvage M.D.   On: 04/15/2024 09:35      Krystal Spinner 04/17/2024  Patient ID: Autumn Frazier, female   DOB: 1941-04-30, 83 y.o.   MRN: [id number]

## 2024-04-17 NOTE — Progress Notes (Signed)
 No issues overnight. No complaints of nausea or vomiting. Pt looking forward to going home today

## 2024-04-17 NOTE — Progress Notes (Signed)
 No vitals taken at 4 am allowed pt to sleep

## 2024-04-17 NOTE — Plan of Care (Signed)

## 2024-04-20 DIAGNOSIS — I351 Nonrheumatic aortic (valve) insufficiency: Secondary | ICD-10-CM | POA: Diagnosis not present

## 2024-04-20 DIAGNOSIS — C541 Malignant neoplasm of endometrium: Secondary | ICD-10-CM | POA: Diagnosis not present

## 2024-04-20 DIAGNOSIS — I3481 Nonrheumatic mitral (valve) annulus calcification: Secondary | ICD-10-CM | POA: Diagnosis not present

## 2024-04-20 DIAGNOSIS — I080 Rheumatic disorders of both mitral and aortic valves: Secondary | ICD-10-CM | POA: Diagnosis not present

## 2024-04-22 DIAGNOSIS — Z5112 Encounter for antineoplastic immunotherapy: Secondary | ICD-10-CM | POA: Diagnosis not present

## 2024-04-22 DIAGNOSIS — C541 Malignant neoplasm of endometrium: Secondary | ICD-10-CM | POA: Diagnosis not present

## 2024-04-27 DIAGNOSIS — K56609 Unspecified intestinal obstruction, unspecified as to partial versus complete obstruction: Secondary | ICD-10-CM | POA: Diagnosis not present

## 2024-04-27 DIAGNOSIS — C55 Malignant neoplasm of uterus, part unspecified: Secondary | ICD-10-CM | POA: Diagnosis not present

## 2024-04-27 DIAGNOSIS — D72819 Decreased white blood cell count, unspecified: Secondary | ICD-10-CM | POA: Diagnosis not present

## 2024-04-27 DIAGNOSIS — F4321 Adjustment disorder with depressed mood: Secondary | ICD-10-CM | POA: Diagnosis not present

## 2024-04-27 DIAGNOSIS — D539 Nutritional anemia, unspecified: Secondary | ICD-10-CM | POA: Diagnosis not present

## 2024-04-27 DIAGNOSIS — N1832 Chronic kidney disease, stage 3b: Secondary | ICD-10-CM | POA: Diagnosis not present

## 2024-05-04 DIAGNOSIS — J309 Allergic rhinitis, unspecified: Secondary | ICD-10-CM | POA: Diagnosis not present

## 2024-05-04 DIAGNOSIS — I10 Essential (primary) hypertension: Secondary | ICD-10-CM | POA: Diagnosis not present

## 2024-05-04 DIAGNOSIS — G620 Drug-induced polyneuropathy: Secondary | ICD-10-CM | POA: Diagnosis not present

## 2024-05-04 DIAGNOSIS — C55 Malignant neoplasm of uterus, part unspecified: Secondary | ICD-10-CM | POA: Diagnosis not present

## 2024-05-04 DIAGNOSIS — R296 Repeated falls: Secondary | ICD-10-CM | POA: Diagnosis not present

## 2024-05-04 DIAGNOSIS — R2689 Other abnormalities of gait and mobility: Secondary | ICD-10-CM | POA: Diagnosis not present

## 2024-05-04 DIAGNOSIS — R269 Unspecified abnormalities of gait and mobility: Secondary | ICD-10-CM | POA: Diagnosis not present

## 2024-05-18 DIAGNOSIS — C541 Malignant neoplasm of endometrium: Secondary | ICD-10-CM | POA: Diagnosis not present

## 2024-05-20 DIAGNOSIS — C541 Malignant neoplasm of endometrium: Secondary | ICD-10-CM | POA: Diagnosis not present
